# Patient Record
Sex: Female | Born: 1975 | Race: Black or African American | Hispanic: No | Smoking: Never smoker
Health system: Southern US, Community
[De-identification: ages and names within clinical notes are randomized; demographics above are authoritative.]

## PROBLEM LIST (undated history)

## (undated) ENCOUNTER — Ambulatory Visit: Discharge status: Absent without leave | Payer: Medicaid Other

## (undated) ENCOUNTER — Emergency Department (HOSPITAL_BASED_OUTPATIENT_CLINIC_OR_DEPARTMENT_OTHER): Admission: EM | Payer: Medicaid Other | Source: Home / Self Care

## (undated) ENCOUNTER — Emergency Department (HOSPITAL_COMMUNITY): Discharge status: Absent without leave | Payer: Medicaid Other

## (undated) DIAGNOSIS — F32A Depression, unspecified: Secondary | ICD-10-CM

## (undated) DIAGNOSIS — D494 Neoplasm of unspecified behavior of bladder: Secondary | ICD-10-CM

## (undated) DIAGNOSIS — M419 Scoliosis, unspecified: Secondary | ICD-10-CM

## (undated) DIAGNOSIS — M479 Spondylosis, unspecified: Secondary | ICD-10-CM

## (undated) DIAGNOSIS — M5136 Other intervertebral disc degeneration, lumbar region: Secondary | ICD-10-CM

## (undated) DIAGNOSIS — N939 Abnormal uterine and vaginal bleeding, unspecified: Secondary | ICD-10-CM

## (undated) DIAGNOSIS — G43909 Migraine, unspecified, not intractable, without status migrainosus: Secondary | ICD-10-CM

## (undated) DIAGNOSIS — G2581 Restless legs syndrome: Secondary | ICD-10-CM

## (undated) DIAGNOSIS — G4730 Sleep apnea, unspecified: Secondary | ICD-10-CM

## (undated) DIAGNOSIS — R351 Nocturia: Secondary | ICD-10-CM

## (undated) DIAGNOSIS — Z8719 Personal history of other diseases of the digestive system: Secondary | ICD-10-CM

## (undated) DIAGNOSIS — F419 Anxiety disorder, unspecified: Secondary | ICD-10-CM

## (undated) DIAGNOSIS — G588 Other specified mononeuropathies: Secondary | ICD-10-CM

## (undated) DIAGNOSIS — F329 Major depressive disorder, single episode, unspecified: Secondary | ICD-10-CM

## (undated) DIAGNOSIS — R3915 Urgency of urination: Secondary | ICD-10-CM

## (undated) DIAGNOSIS — R102 Pelvic and perineal pain: Secondary | ICD-10-CM

## (undated) DIAGNOSIS — K219 Gastro-esophageal reflux disease without esophagitis: Secondary | ICD-10-CM

## (undated) DIAGNOSIS — I10 Essential (primary) hypertension: Secondary | ICD-10-CM

## (undated) DIAGNOSIS — R002 Palpitations: Secondary | ICD-10-CM

## (undated) DIAGNOSIS — M797 Fibromyalgia: Secondary | ICD-10-CM

## (undated) DIAGNOSIS — M51369 Other intervertebral disc degeneration, lumbar region without mention of lumbar back pain or lower extremity pain: Secondary | ICD-10-CM

## (undated) HISTORY — PX: ABDOMINAL HYSTERECTOMY: SHX81

## (undated) HISTORY — PX: LIPOMA EXCISION: SHX5283

---

## 2000-04-04 ENCOUNTER — Emergency Department (HOSPITAL_COMMUNITY): Admission: EM | Admit: 2000-04-04 | Discharge: 2000-04-04 | Payer: Self-pay | Admitting: Emergency Medicine

## 2005-04-06 ENCOUNTER — Other Ambulatory Visit: Admission: RE | Admit: 2005-04-06 | Discharge: 2005-04-06 | Payer: Self-pay | Admitting: Obstetrics and Gynecology

## 2005-05-15 ENCOUNTER — Inpatient Hospital Stay (HOSPITAL_COMMUNITY): Admission: AD | Admit: 2005-05-15 | Discharge: 2005-05-15 | Payer: Self-pay | Admitting: Obstetrics and Gynecology

## 2005-07-03 ENCOUNTER — Inpatient Hospital Stay (HOSPITAL_COMMUNITY): Admission: AD | Admit: 2005-07-03 | Discharge: 2005-07-03 | Payer: Self-pay | Admitting: Obstetrics and Gynecology

## 2005-07-04 ENCOUNTER — Inpatient Hospital Stay (HOSPITAL_COMMUNITY): Admission: AD | Admit: 2005-07-04 | Discharge: 2005-07-04 | Payer: Self-pay | Admitting: Obstetrics and Gynecology

## 2005-07-31 ENCOUNTER — Inpatient Hospital Stay (HOSPITAL_COMMUNITY): Admission: AD | Admit: 2005-07-31 | Discharge: 2005-08-05 | Payer: Self-pay | Admitting: Obstetrics and Gynecology

## 2005-08-01 ENCOUNTER — Ambulatory Visit: Payer: Self-pay | Admitting: *Deleted

## 2005-08-15 ENCOUNTER — Inpatient Hospital Stay (HOSPITAL_COMMUNITY): Admission: AD | Admit: 2005-08-15 | Discharge: 2005-08-15 | Payer: Self-pay | Admitting: Obstetrics and Gynecology

## 2005-08-28 ENCOUNTER — Inpatient Hospital Stay (HOSPITAL_COMMUNITY): Admission: AD | Admit: 2005-08-28 | Discharge: 2005-08-30 | Payer: Self-pay | Admitting: Obstetrics and Gynecology

## 2005-09-29 ENCOUNTER — Emergency Department (HOSPITAL_COMMUNITY): Admission: EM | Admit: 2005-09-29 | Discharge: 2005-09-29 | Payer: Self-pay | Admitting: Emergency Medicine

## 2005-12-13 ENCOUNTER — Emergency Department (HOSPITAL_COMMUNITY): Admission: EM | Admit: 2005-12-13 | Discharge: 2005-12-13 | Payer: Self-pay | Admitting: Family Medicine

## 2006-02-05 ENCOUNTER — Emergency Department (HOSPITAL_COMMUNITY): Admission: EM | Admit: 2006-02-05 | Discharge: 2006-02-05 | Payer: Self-pay | Admitting: Emergency Medicine

## 2006-04-18 ENCOUNTER — Other Ambulatory Visit: Admission: RE | Admit: 2006-04-18 | Discharge: 2006-04-18 | Payer: Self-pay | Admitting: Obstetrics and Gynecology

## 2007-12-29 ENCOUNTER — Inpatient Hospital Stay (HOSPITAL_COMMUNITY): Admission: AD | Admit: 2007-12-29 | Discharge: 2007-12-29 | Payer: Self-pay | Admitting: Obstetrics and Gynecology

## 2008-01-10 ENCOUNTER — Emergency Department (HOSPITAL_COMMUNITY): Admission: EM | Admit: 2008-01-10 | Discharge: 2008-01-10 | Payer: Self-pay | Admitting: Emergency Medicine

## 2008-01-13 ENCOUNTER — Emergency Department (HOSPITAL_COMMUNITY): Admission: EM | Admit: 2008-01-13 | Discharge: 2008-01-13 | Payer: Self-pay | Admitting: Family Medicine

## 2008-02-01 ENCOUNTER — Emergency Department (HOSPITAL_COMMUNITY): Admission: EM | Admit: 2008-02-01 | Discharge: 2008-02-01 | Payer: Self-pay | Admitting: Neurosurgery

## 2008-03-28 ENCOUNTER — Ambulatory Visit (HOSPITAL_COMMUNITY): Admission: RE | Admit: 2008-03-28 | Discharge: 2008-03-28 | Payer: Self-pay | Admitting: Obstetrics and Gynecology

## 2008-04-08 ENCOUNTER — Encounter: Admission: RE | Admit: 2008-04-08 | Discharge: 2008-04-08 | Payer: Self-pay | Admitting: Obstetrics and Gynecology

## 2008-04-14 ENCOUNTER — Inpatient Hospital Stay (HOSPITAL_COMMUNITY): Admission: AD | Admit: 2008-04-14 | Discharge: 2008-04-15 | Payer: Self-pay | Admitting: Obstetrics and Gynecology

## 2008-04-25 ENCOUNTER — Ambulatory Visit (HOSPITAL_COMMUNITY): Admission: RE | Admit: 2008-04-25 | Discharge: 2008-04-25 | Payer: Self-pay | Admitting: Obstetrics and Gynecology

## 2008-05-16 ENCOUNTER — Ambulatory Visit (HOSPITAL_COMMUNITY): Admission: RE | Admit: 2008-05-16 | Discharge: 2008-05-16 | Payer: Self-pay | Admitting: Obstetrics and Gynecology

## 2008-05-27 ENCOUNTER — Inpatient Hospital Stay (HOSPITAL_COMMUNITY): Admission: AD | Admit: 2008-05-27 | Discharge: 2008-05-27 | Payer: Self-pay | Admitting: Obstetrics and Gynecology

## 2008-05-29 ENCOUNTER — Inpatient Hospital Stay (HOSPITAL_COMMUNITY): Admission: AD | Admit: 2008-05-29 | Discharge: 2008-05-29 | Payer: Self-pay | Admitting: Obstetrics and Gynecology

## 2008-06-06 ENCOUNTER — Ambulatory Visit (HOSPITAL_COMMUNITY): Admission: RE | Admit: 2008-06-06 | Discharge: 2008-06-06 | Payer: Self-pay | Admitting: Obstetrics and Gynecology

## 2008-06-08 ENCOUNTER — Inpatient Hospital Stay (HOSPITAL_COMMUNITY): Admission: AD | Admit: 2008-06-08 | Discharge: 2008-06-09 | Payer: Self-pay | Admitting: Obstetrics and Gynecology

## 2008-06-15 ENCOUNTER — Inpatient Hospital Stay (HOSPITAL_COMMUNITY): Admission: AD | Admit: 2008-06-15 | Discharge: 2008-06-15 | Payer: Self-pay | Admitting: Obstetrics and Gynecology

## 2008-06-20 ENCOUNTER — Inpatient Hospital Stay (HOSPITAL_COMMUNITY): Admission: AD | Admit: 2008-06-20 | Discharge: 2008-06-20 | Payer: Self-pay | Admitting: Obstetrics and Gynecology

## 2008-07-03 ENCOUNTER — Inpatient Hospital Stay (HOSPITAL_COMMUNITY): Admission: AD | Admit: 2008-07-03 | Discharge: 2008-07-05 | Payer: Self-pay | Admitting: Obstetrics and Gynecology

## 2008-08-31 ENCOUNTER — Emergency Department (HOSPITAL_BASED_OUTPATIENT_CLINIC_OR_DEPARTMENT_OTHER): Admission: EM | Admit: 2008-08-31 | Discharge: 2008-08-31 | Payer: Self-pay | Admitting: Emergency Medicine

## 2008-10-18 HISTORY — PX: ESSURE TUBAL LIGATION: SUR464

## 2009-04-28 ENCOUNTER — Ambulatory Visit: Payer: Self-pay | Admitting: Diagnostic Radiology

## 2009-04-28 ENCOUNTER — Emergency Department (HOSPITAL_BASED_OUTPATIENT_CLINIC_OR_DEPARTMENT_OTHER): Admission: EM | Admit: 2009-04-28 | Discharge: 2009-04-28 | Payer: Self-pay | Admitting: Emergency Medicine

## 2009-05-02 ENCOUNTER — Emergency Department (HOSPITAL_BASED_OUTPATIENT_CLINIC_OR_DEPARTMENT_OTHER): Admission: EM | Admit: 2009-05-02 | Discharge: 2009-05-02 | Payer: Self-pay | Admitting: Emergency Medicine

## 2009-05-02 ENCOUNTER — Ambulatory Visit: Payer: Self-pay | Admitting: Diagnostic Radiology

## 2009-07-05 ENCOUNTER — Emergency Department (HOSPITAL_COMMUNITY): Admission: EM | Admit: 2009-07-05 | Discharge: 2009-07-05 | Payer: Self-pay | Admitting: Family Medicine

## 2009-07-08 ENCOUNTER — Ambulatory Visit (HOSPITAL_COMMUNITY): Admission: RE | Admit: 2009-07-08 | Discharge: 2009-07-08 | Payer: Self-pay | Admitting: Obstetrics and Gynecology

## 2010-05-17 ENCOUNTER — Emergency Department (HOSPITAL_BASED_OUTPATIENT_CLINIC_OR_DEPARTMENT_OTHER): Admission: EM | Admit: 2010-05-17 | Discharge: 2010-05-17 | Payer: Self-pay | Admitting: Emergency Medicine

## 2010-10-25 ENCOUNTER — Emergency Department (HOSPITAL_BASED_OUTPATIENT_CLINIC_OR_DEPARTMENT_OTHER)
Admission: EM | Admit: 2010-10-25 | Discharge: 2010-10-25 | Payer: Self-pay | Source: Home / Self Care | Admitting: Emergency Medicine

## 2010-10-27 ENCOUNTER — Ambulatory Visit
Admission: RE | Admit: 2010-10-27 | Discharge: 2010-10-27 | Payer: Self-pay | Source: Home / Self Care | Attending: Family Medicine | Admitting: Family Medicine

## 2010-10-27 DIAGNOSIS — S93409A Sprain of unspecified ligament of unspecified ankle, initial encounter: Secondary | ICD-10-CM | POA: Insufficient documentation

## 2010-10-27 DIAGNOSIS — E0590 Thyrotoxicosis, unspecified without thyrotoxic crisis or storm: Secondary | ICD-10-CM | POA: Insufficient documentation

## 2010-10-27 DIAGNOSIS — K219 Gastro-esophageal reflux disease without esophagitis: Secondary | ICD-10-CM | POA: Insufficient documentation

## 2010-10-27 DIAGNOSIS — F411 Generalized anxiety disorder: Secondary | ICD-10-CM | POA: Insufficient documentation

## 2010-11-04 ENCOUNTER — Ambulatory Visit
Admission: RE | Admit: 2010-11-04 | Discharge: 2010-11-04 | Payer: Self-pay | Source: Home / Self Care | Attending: Family Medicine | Admitting: Family Medicine

## 2010-11-08 ENCOUNTER — Encounter: Payer: Self-pay | Admitting: Obstetrics and Gynecology

## 2010-11-16 ENCOUNTER — Encounter: Admission: RE | Admit: 2010-11-16 | Payer: Self-pay | Source: Home / Self Care | Admitting: Family Medicine

## 2010-11-17 ENCOUNTER — Ambulatory Visit
Admission: RE | Admit: 2010-11-17 | Discharge: 2010-11-17 | Payer: Self-pay | Source: Home / Self Care | Attending: Family Medicine | Admitting: Family Medicine

## 2010-11-18 ENCOUNTER — Ambulatory Visit: Payer: Medicaid Other | Attending: Family Medicine | Admitting: Physical Therapy

## 2010-11-18 DIAGNOSIS — M25579 Pain in unspecified ankle and joints of unspecified foot: Secondary | ICD-10-CM | POA: Insufficient documentation

## 2010-11-18 DIAGNOSIS — R262 Difficulty in walking, not elsewhere classified: Secondary | ICD-10-CM | POA: Insufficient documentation

## 2010-11-18 DIAGNOSIS — M25673 Stiffness of unspecified ankle, not elsewhere classified: Secondary | ICD-10-CM | POA: Insufficient documentation

## 2010-11-18 DIAGNOSIS — IMO0001 Reserved for inherently not codable concepts without codable children: Secondary | ICD-10-CM | POA: Insufficient documentation

## 2010-11-18 DIAGNOSIS — M25676 Stiffness of unspecified foot, not elsewhere classified: Secondary | ICD-10-CM | POA: Insufficient documentation

## 2010-11-19 NOTE — Assessment & Plan Note (Signed)
Summary: SPRAIN LEFT ANKLE/NP/LP   Vital Signs:  Patient profile:   35 year old female Height:      63 inches (160.02 cm) Weight:      185.0 pounds (84.09 kg) BMI:     32.89 Temp:     97.9 degrees F (36.61 degrees C) oral Pulse rate:   56 / minute BP sitting:   107 / 71  (left arm)  Vitals Entered By: Baxter Hire) (October 27, 2010 10:07 AM) CC: sprain left ankle Pain Assessment Patient in pain? yes     Location: left ankle Intensity: 7 Type: aching Onset of pain  constant pain x 2 days Nutritional Status BMI of > 30 = obese  Does patient need assistance? Functional Status Self care Ambulation Normal   CC:  sprain left ankle.  History of Present Illness: 34 yo F here for left ankle injury  Patient reports she was running quickly because her child was about to fall down the stairs. She tripped over a child's car toy and inverted her left ankle and fell down. This occurred on 10/25/10. No prior ankle injuries. + Swelling but no bruising. Went to ED and had x-rays that showed possible indeterminate avulsion injury to navicular but she has not had pain in this area Was placed in walking boot, given pain medication and advised to follow up here Pain continues and has been icing. Not taking NSAIDs.  Difficulty bearing weight comfortably.  Habits & Providers  Alcohol-Tobacco-Diet     Alcohol drinks/day: 0     Tobacco Status: never  Allergies (verified): No Known Drug Allergies  Family History: negative for DM, HTN, heart disease  Social History: no tobacco, alcohol abuse StudentSmoking Status:  never  Physical Exam  General:  Well-developed,well-nourished,in no acute distress; alert,appropriate and cooperative throughout examination Msk:  L Ankle: Moderate swelling throughout lateral and medial ankle. No bruising or erythema. Very limited ROM. Strength not tested. Negative squeeze test. No TTP navicular, base 5th.  TTP throughout ankle joint  including post med and lat malleoli.  No TTP fibular head. Able to walk 4 steps only with boot on.   Impression & Recommendations:  Problem # 1:  ANKLE SPRAIN, LEFT (ICD-845.00) Assessment New  Patient's x-ray finding over navicular inconsistent with her current injury - no pain at this area.  Patient with grade 2-3 ankle sprain.  Continue using boot but also use crutches to help wtih ambulation for next 7-10 days - wean off crutches as tolerated.  Icing, elevation, ACE wrap, early motion exercises demonstrated.  Aleve for pain and swelling.  F/u in 7-10 days for recheck - reexam and hopefully transition to ASO.  See instructions for further.  Consider PT at f/u as well.  Orders: Crutches-[facility] ([id number])  Complete Medication List: 1)  Alprazolam 1 Mg Tabs (Alprazolam) .... Prn 2)  Lexapro 10 Mg Tabs (Escitalopram oxalate)  Patient Instructions: 1)  Wear boot except when showering, sleeping. 2)  Ice for 15 minutes at a time 3-4 times a day 3)  Take aleve 2 tabs twice a day with food for pain, swelling, and inflammation. 4)  You can take the pain medication in addition to this. 5)  Elevate above the level of your heart when possible 6)  Crutches if needed to help with walking 7)  Bear weight when tolerated 8)  Up/down and alphabet exercise 2-3 sets daily 9)  Follow up with me in 7-10 days for a recheck. 10)  Hopefully at this visit we  can get you out of the boot and into the lace up ankle brace. 11)  Call me with any questions.   Orders Added: 1)  New Patient Level III [99203] 2)  Crutches-[facility] [E0114]

## 2010-11-19 NOTE — Assessment & Plan Note (Signed)
Summary: F/U/LP   Vital Signs:  Patient profile:   35 year old female Height:      63 inches (160.02 cm) Weight:      185 pounds (84.09 kg) BMI:     32.89 Temp:     98.1 degrees F (36.72 degrees C) oral Pulse rate:   82 / minute BP sitting:   110 / 71  (left arm)  Vitals Entered By: Baxter Hire) (November 04, 2010 10:03 AM) CC: follow-up visit Pain Assessment Patient in pain? yes     Location: left foot Intensity: 2 Nutritional Status BMI of > 30 = obese  Does patient need assistance? Functional Status Self care Ambulation Normal   CC:  follow-up visit.  History of Present Illness: 35 yo F here for  ~ 1 1/2 week f/u left ankle sprain  Initial injury from when she was running quickly because her child was about to fall down the stairs. She tripped over a child's car toy and inverted her left ankle and fell down. This occurred on 10/25/10. No prior ankle injuries. + Swelling but no bruising - much better now. Went to ED and had x-rays that showed possible indeterminate avulsion injury to navicular but she has not had pain in this area Was placed in walking boot, given pain medication and advised to follow up here Advised to wear walking boot and use crutches after visit here Now able to walk without crutches Pain much improved - now a 2/10 from a 7/10 Has been icing, elevating, using ACE wrap, and doing motion exercises.  Habits & Providers  Alcohol-Tobacco-Diet     Alcohol drinks/day: 0     Tobacco Status: never  Problems Prior to Update: 1)  Ankle Sprain, Left  (ICD-845.00) 2)  Hyperthyroidism  (ICD-242.90) 3)  Gerd  (ICD-530.81) 4)  Generalized Anxiety Disorder  (ICD-300.02)  Medications Prior to Update: 1)  Alprazolam 1 Mg Tabs (Alprazolam) .... Prn 2)  Lexapro 10 Mg Tabs (Escitalopram Oxalate)  Allergies (verified): No Known Drug Allergies  Physical Exam  General:  Well-developed,well-nourished,in no acute distress; alert,appropriate and  cooperative throughout examination Msk:  L Ankle: Mild swelling throughout lateral and medial ankle. No bruising or erythema. ROM almost full with plantar/dorsiflexion. Strength not tested. Negative squeeze test. No TTP navicular, base 5th.  TTP throughout ankle joint but not at post med and lat malleoli.  No TTP fibular head. Able to walk 4 steps without boot with mild limp. 1+ ant drawer and talar tilt - pain with talar tilt laterally.   Impression & Recommendations:  Problem # 1:  ANKLE SPRAIN, LEFT (ICD-845.00) Assessment Improved  Transition out of walking boot and into ASO.  Discontinue crutches.  Continue with elevation, icing, ace wrap as needed, aleve as needed.  Start formal PT over next 2 weeks then f/u in 2 weeks for recheck.  Continue HEP.  Orders: Ankle Training Brace/ASO Support [phone])  Complete Medication List: 1)  Alprazolam 1 Mg Tabs (Alprazolam) .... Prn 2)  Lexapro 10 Mg Tabs (Escitalopram oxalate)  Patient Instructions: 1)  Stop using boot and use the ankle brace instead in a well supported shoe (tennis shoe). 2)  Continue icing for 15 minutes at a time 3-4 times a day 3)  Take aleve 2 tabs twice a day with food for pain, swelling, and inflammation as needed. 4)  Elevate above the level of your heart when possible 5)  Up/down and alphabet exercise 2-3 sets daily 6)  Go to physical therapy  over the next 2 weeks. 7)  Follow up with me in 2 weeks for a recheck. 8)  Call me with any questions.   Orders Added: 1)  Est. Patient Level III [72536] 2)  Ankle Training Brace/ASO Support [[id number]]

## 2010-11-25 ENCOUNTER — Encounter: Payer: Medicaid Other | Admitting: Physical Therapy

## 2010-11-25 NOTE — Assessment & Plan Note (Signed)
Summary: F/U/LP   Vital Signs:  Patient profile:   35 year old female Height:      63 inches (160.02 cm) Weight:      185 pounds (84.09 kg) BMI:     32.89 Temp:     99.2 degrees F (37.33 degrees C) oral Pulse rate:   76 / minute BP sitting:   116 / 79  (left arm)  Vitals Entered By: Baxter Hire) (November 17, 2010 11:05 AM) CC: follow-up visit Pain Assessment Patient in pain? yes     Location: Lt. ankle Intensity: 2 Nutritional Status BMI of > 30 = obese  Does patient need assistance? Functional Status Self care Ambulation Normal   CC:  follow-up visit.  History of Present Illness: 35 yo F here for  ~ 2 week f/u left ankle sprain  Initial injury from when she was running quickly because 35 child was about to fall down the stairs. She tripped over a child's car toy and inverted her left ankle and fell down. This occurred on 10/25/10. No prior ankle injuries. No longer with swelling or bruising. After injury went to ED and had x-rays that showed possible indeterminate avulsion injury to navicular but she has not had pain in this area Was placed in walking boot, given pain medication and advised to follow up here Advised to wear walking boot and use crutches after visit here At last visit off crutches and transitioned then to ASO Has not yet started PT Walking without a limp now Pain at nighttime but otherwise minimal pain Doing home exercises. Taking aleve and icing as needed.  Habits & Providers  Alcohol-Tobacco-Diet     Alcohol drinks/day: 0     Tobacco Status: never  Problems Prior to Update: 1)  Ankle Sprain, Left  (ICD-845.00) 2)  Hyperthyroidism  (ICD-242.90) 3)  Gerd  (ICD-530.81) 4)  Generalized Anxiety Disorder  (ICD-300.02)  Medications Prior to Update: 1)  Alprazolam 1 Mg Tabs (Alprazolam) .... Prn 2)  Lexapro 10 Mg Tabs (Escitalopram Oxalate)  Allergies (verified): No Known Drug Allergies  Physical Exam  General:   Well-developed,well-nourished,in no acute distress; alert,appropriate and cooperative throughout examination Msk:  L Ankle: No swelling, bruising or erythema. ROM full with plantar/dorsiflexion.  Mild limitation with IR/ER. Negative squeeze test. No TTP navicular, base 5th.  Mild TTP throughout ankle joint but not at post med and lat malleoli.  No TTP fibular head. Able to walk without limp in hallway. 1+ ant drawer and talar tilt - no pain with testing NVI distally.   Impression & Recommendations:  Problem # 1:  ANKLE SPRAIN, LEFT (ICD-845.00) Assessment Improved Much better.  Continue with ASO at least an additional 2 weeks.  Starting PT tomorrow then to transition to HEP.  Ice and aleve as needed.  Elevate if swelling.  See instructions for further.  F/u as needed.  Complete Medication List: 1)  Alprazolam 1 Mg Tabs (Alprazolam) .... Prn 2)  Lexapro 10 Mg Tabs (Escitalopram oxalate)  Patient Instructions: 1)  Continue using the laceup brace for at least the next 2 weeks.  Can stop using when not hurting anymore 2)  Go through physical therapy for 2 weeks then do home exercises for at least 4 more weeks. 3)  Ice and aleve as needed. 4)  Follow up with me as needed.   Orders Added: 1)  Est. Patient Level II [16109]

## 2011-01-02 LAB — WOUND CULTURE

## 2011-01-24 LAB — URINE MICROSCOPIC-ADD ON

## 2011-01-24 LAB — COMPREHENSIVE METABOLIC PANEL
ALT: 6 U/L (ref 0–35)
ALT: <6 U/L (ref 0–35)
AST: 21 U/L (ref 0–37)
AST: 22 U/L (ref 0–37)
Albumin: 3.7 g/dL (ref 3.5–5.2)
Albumin: 3.8 g/dL (ref 3.5–5.2)
Alkaline Phosphatase: 77 U/L (ref 39–117)
Alkaline Phosphatase: 93 U/L (ref 39–117)
BUN: 7 mg/dL (ref 6–23)
CO2: 28 mEq/L (ref 19–32)
CO2: 28 mEq/L (ref 19–32)
Calcium: 8.6 mg/dL (ref 8.4–10.5)
Chloride: 107 mEq/L (ref 96–112)
Chloride: 107 mEq/L (ref 96–112)
Creatinine, Ser: 0.8 mg/dL (ref 0.4–1.2)
GFR calc Af Amer: >60 mL/min (ref >60)
GFR calc Af Amer: >60 mL/min (ref >60)
GFR calc non Af Amer: >60 mL/min (ref >60)
GFR calc non Af Amer: >60 mL/min (ref >60)
Glucose, Bld: 84 mg/dL (ref 70–99)
Potassium: 3.3 mEq/L — ABNORMAL LOW (ref 3.5–5.1)
Potassium: 3.6 mEq/L (ref 3.5–5.1)
Sodium: 141 mEq/L (ref 135–145)
Total Bilirubin: 0.2 mg/dL — ABNORMAL LOW (ref 0.3–1.2)
Total Bilirubin: 0.4 mg/dL (ref 0.3–1.2)
Total Protein: 7.2 g/dL (ref 6.0–8.3)

## 2011-01-24 LAB — DIFFERENTIAL
Basophils Absolute: 0 10*3/uL (ref 0.0–0.1)
Basophils Absolute: 0 10*3/uL (ref 0.0–0.1)
Basophils Relative: 0 % (ref 0–1)
Basophils Relative: 0 % (ref 0–1)
Eosinophils Absolute: 0.4 10*3/uL (ref 0.0–0.7)
Eosinophils Absolute: 0.6 10*3/uL (ref 0.0–0.7)
Eosinophils Relative: 5 % (ref 0–5)
Eosinophils Relative: 8 % — ABNORMAL HIGH (ref 0–5)
Lymphocytes Relative: 19 % (ref 12–46)
Lymphs Abs: 1.4 10*3/uL (ref 0.7–4.0)
Monocytes Absolute: 0.4 10*3/uL (ref 0.1–1.0)
Monocytes Absolute: 0.6 10*3/uL (ref 0.1–1.0)
Monocytes Relative: 8 % (ref 3–12)
Neutro Abs: 5.2 10*3/uL (ref 1.7–7.7)
Neutrophils Relative %: 67 % (ref 43–77)

## 2011-01-24 LAB — URINALYSIS, ROUTINE W REFLEX MICROSCOPIC
Bilirubin Urine: NEGATIVE
Bilirubin Urine: NEGATIVE
Glucose, UA: NEGATIVE mg/dL
Glucose, UA: NEGATIVE mg/dL
Hgb urine dipstick: NEGATIVE
Hgb urine dipstick: NEGATIVE
Ketones, ur: 15 mg/dL — AB
Ketones, ur: NEGATIVE mg/dL
Nitrite: NEGATIVE
Nitrite: NEGATIVE
Protein, ur: NEGATIVE mg/dL
Protein, ur: NEGATIVE mg/dL
Specific Gravity, Urine: 1.021 (ref 1.005–1.030)
Specific Gravity, Urine: 1.021 (ref 1.005–1.030)
Urobilinogen, UA: 0.2 mg/dL (ref 0.0–1.0)
Urobilinogen, UA: 0.2 mg/dL (ref 0.0–1.0)
pH: 6 (ref 5.0–8.0)
pH: 6 (ref 5.0–8.0)

## 2011-01-24 LAB — GC/CHLAMYDIA PROBE AMP, GENITAL
Chlamydia, DNA Probe: NEGATIVE
GC Probe Amp, Genital: NEGATIVE

## 2011-01-24 LAB — CBC
HCT: 37.1 % (ref 36.0–46.0)
Hemoglobin: 12.6 g/dL (ref 12.0–15.0)
MCHC: 33.9 g/dL (ref 30.0–36.0)
MCV: 86.2 fL (ref 78.0–100.0)
Platelets: 215 10*3/uL (ref 150–400)
Platelets: 231 10*3/uL (ref 150–400)
RBC: 4.26 MIL/uL (ref 3.87–5.11)
RBC: 4.31 MIL/uL (ref 3.87–5.11)
RDW: 12.8 % (ref 11.5–15.5)
WBC: 7.6 10*3/uL (ref 4.0–10.5)
WBC: 7.7 10*3/uL (ref 4.0–10.5)

## 2011-01-24 LAB — PREGNANCY, URINE
Preg Test, Ur: NEGATIVE
Preg Test, Ur: NEGATIVE

## 2011-01-24 LAB — WET PREP, GENITAL
Trich, Wet Prep: NONE SEEN
WBC, Wet Prep HPF POC: NONE SEEN
Yeast Wet Prep HPF POC: NONE SEEN

## 2011-03-02 NOTE — Discharge Summary (Signed)
Kayla Mendoza, Kayla Mendoza NO.:  [id number]   MEDICAL RECORD NO.:  [id number]          PATIENT TYPE:  INP   LOCATION:  9120                          FACILITY:  [facility]   PHYSICIAN:  Huel Cote, M.D. DATE OF BIRTH:  [birth year]   DATE OF ADMISSION:  07/03/2008  DATE OF DISCHARGE:  07/05/2008                               DISCHARGE SUMMARY   DISCHARGE DIAGNOSES:  1. Term pregnancy at 39 plus weeks, delivered.  2. Status post normal spontaneous vaginal delivery.   DISCHARGE MEDICATIONS:  1. Motrin 600 mg p.o. every 6 hours.  2. Percocet 1-2 tablets p.o. every 4 hours p.r.n.   DISCHARGE FOLLOWUP:  The patient is to follow up in 6 weeks for her  routine postpartum exam.   HOSPITAL COURSE:  The patient is a 35 year old G7, P 1-2-3-3, who was  admitted at 54 plus weeks' gestation with induction of labor given her  term status and a favorable cervix.  Baby was also thought to be on the  large side, greater than 8 pounds.  Prenatal care was notable for a  history of preterm delivery.  She received Delalutin throughout her  pregnancy weekly up until 36 weeks.  She also has hyperthyroidism, on  PTU monitored by Dr. Talmage Nap.  She had serial growth scans and NSTs 2  times a week after 32 weeks.   Prenatal labs are as follows, O positive, antibody negative, sickle  negative, RPR nonreactive, rubella immune, hepatitis B surface antigen  negative, HIV nonreactive.  She had an initial gonorrhea culture, which  was positive, which was treated with a negative test of cure, chlamydia  negative, group B strep positive, 1-hour Glucola normal, and first  trimester screen normal.   PAST OBSTETRICAL HISTORY:  In 1993, she had a vaginal delivery of a term  baby.  In 1999, she had a vaginal delivery of a 36-week infant.  In 1999  and 2004, she had 2 elective abortions.  In 2006, she had a vaginal  delivery of a 36-week infant, and in 2008, she had a spontaneous  miscarriage.   PAST GYN  HISTORY:  History of abnormal Pap smears in 2000, resolved now.   PAST SURGICAL HISTORY:  None.   PAST MEDICAL HISTORY:  Hyperthyroidism, history of ulcer disease,  anxiety, depression, and MRSA boils.   MEDICATIONS:  PTU.   On admission, she was 50, 3-4, and -1-2  station.  She had already  received her penicillin for her group B strep prophylaxis and was placed  on Pitocin.  She progressed quickly throughout the day, reached complete  dilation, and pushed great with a normal spontaneous vaginal delivery of  a vigorous female infant over an intact perineum.  Apgars  were 8 and 9.  Weight was 8 pounds 11 ounces.  Placenta delivered  spontaneously.  She was then admitted for routine postpartum care.  On  postpartum day #2, she was doing quite well.  Her discharge hemoglobin  was 9.2.  She was tolerating her p.o. pain medications and bleeding was  normal and she was felt stable for discharge  home.      Huel Cote, M.D.  Electronically Signed     KR/MEDQ  D:  08/25/2008  T:  08/26/2008  Job:  [id number]

## 2011-03-05 NOTE — Discharge Summary (Signed)
Kayla Mendoza, Kayla Mendoza                 ACCOUNT NO.:  [id number]   MEDICAL RECORD NO.:  [id number]          PATIENT TYPE:  INP   LOCATION:  9158                          FACILITY:  [facility]   PHYSICIAN:  Malachi Pro. Ambrose Mantle, M.D. DATE OF BIRTH:  1975-12-10   DATE OF ADMISSION:  07/31/2005  DATE OF DISCHARGE:  08/05/2005                                 DISCHARGE SUMMARY   LABORATORY DATA AND X-RAY FINDINGS:  O positive with a negative antibody.  GC and Chlamydia negative.  Group B Streptococcus positive.  Rubella immune.  Hepatitis B surface antigen negative.  HIV negative.  Triple screen  negative.  RPR nonreactive.   HISTORY OF PRESENT ILLNESS:  A 35 year old, black female, P1-1-2-2 at 35-0/7  weeks' gestation with [facility] of September 25, 2005, by 13-week ultrasound,  inconsistent with last period presented to [facility] with contractions every 5  minutes x3 hours.  She had intermittent preterm labor with the cervix 1 cm  on prior exam.  She was given subcu terbutaline previously.  Prenatal care  complicated by prior preterm labor x2 delivered at 46 and 38 weeks.  Group B  Streptococcus positive on urine culture.  Fetal fibronectin negative on  July 06, 2005.   PAST OBSTETRICAL HISTORY:  In 1993, she delivered a 6 pound 13 ounce infant  at 38 weeks with preterm labor.  In 1999, a 6 pound 6 ounce infant at 36  weeks in preterm labor.  Early abortion x2.   PAST GYNECOLOGICAL HISTORY:  History of HPV, Pap smear resolved.   PAST SURGICAL HISTORY:  Negative.   PAST MEDICAL HISTORY:  1.  History of ulcer.  2.  GERD.  3.  History of anxiety and depression.   ALLERGIES:  No known drug allergies.   PHYSICAL EXAMINATION:  VITAL SIGNS:  On admission, the patient was afebrile.  HEART:  Normal.  LUNGS:  Normal.  ABDOMEN:  Gravid.  Nontender.  PELVIC:  Cervix soft, ballotable vertex at 2+ cm by Dr. Berenda Morale exam.   HOSPITAL COURSE:  The patient was placed on magnesium sulfate tocolysis,  ampicillin  for Group B Streptococcus coverage and placed at bed rest and  clear diet.  The patient was given betamethasone.  Magnesium sulfate was  stopped on August 02, 2005.  The patient did have an elevated temperature  at 100.2 degrees at 4 p.m. on August 02, 2005.  T-max was 99.9 on August 03, 2005.  She continues to have contractions, but by Dr. Berenda Morale exam  on August 04, 2005, the cervix had not changed.  On August 05, 2005, the  patient was considered candidate for discharge.  Her Group B Streptococcus  culture was positive.  Hemoglobin was 12.5, hematocrit 37.7, white count  9200.  Urinalysis was negative.  RPR was nonreactive.  Magnesium levels were  within therapeutic range.  The patient's highest temperature in the past 24  hours has been 98.7.   DISCHARGE DIAGNOSIS:  Intrauterine pregnancy at 32+ weeks with possible  preterm labor treated with magnesium sulfate, ampicillin and steroids.   CONDITION ON DISCHARGE:  Improved.  SPECIAL INSTRUCTIONS:  Instructions include our regular discharge  instructions.  Call with any unusual problems.  If she has contractions, she  can take Procardia 20 mg every 4-6 hours as needed for the contractions.   DISCHARGE MEDICATIONS:  She is to take 5 more days of ampicillin 500 mg by  mouth every 6 hours to make a full 10-day course.   ACTIVITY:  The patient is advised to rest, but not necessarily strict bed  rest.   FOLLOW UP:  Return to the office in 1 week for followup examination.  She is  advised not to have intercourse.      Malachi Pro. Ambrose Mantle, M.D.  Electronically Signed     TFH/MEDQ  D:  08/05/2005  T:  08/05/2005  Job:  [id number]

## 2011-03-05 NOTE — Discharge Summary (Signed)
Kayla Mendoza, Kayla Mendoza                 ACCOUNT NO.:  [id number]   MEDICAL RECORD NO.:  [id number]          PATIENT TYPE:  INP   LOCATION:  9119                          FACILITY:  [facility]   PHYSICIAN:  Zenaida Niece, M.D.DATE OF BIRTH:  09/03/1976   DATE OF ADMISSION:  08/28/2005  DATE OF DISCHARGE:  08/30/2005                                 DISCHARGE SUMMARY   ADMITTING DIAGNOSES:  1.  Intrauterine pregnancy at 36 weeks.  2.  Preterm labor.  3.  Group B Strep carrier.   DISCHARGE DIAGNOSES:  1.  Intrauterine pregnancy at 36 weeks.  2.  Preterm labor.  3.  Group B Strep carrier.   PROCEDURES:  On November 11 she had a spontaneous vaginal delivery.   HISTORY AND PHYSICAL:  This is a 35 year old black female gravida 5, para 1-  1-2-2 with an EGA of [redacted] weeks who presents with a complaint of regular  contractions.  Evaluation in triage revealed her to be 3 cm dilated with  regular contractions.  Prenatal care complicated by preterm labor at 32  weeks for which she was hospitalized and gastroesophageal reflux treated  with Protonix.   PRENATAL LABORATORIES:  Blood type was O+ with a negative antibody screen.  RPR nonreactive.  Rubella immune.  Hepatitis B surface antigen negative.  HIV negative.  Gonorrhea and Chlamydia negative.  Triple screen normal.  One-  hour Glucola 77.  Group B Strep is positive in her urine.   PAST OBSTETRICAL HISTORY:  1993 vaginal delivery at 38 weeks 6 pounds 13  ounces complicated by preterm labor.  In 1999 vaginal delivery at 36 weeks 6  pounds 6 ounces complicated by preterm labor and two elective terminations.   PAST MEDICAL HISTORY:  Peptic ulcer disease and gastroesophageal reflux  disease as well as anxiety and depression.   PHYSICAL EXAMINATION:  VITAL SIGNS:  She was afebrile with stable vital  signs.  Fetal heart tracing was overall reassuring.  She did have an episode  of fetal bradycardia approximately 30 minutes after her epidural was  placed.  This responded to position change.  ABDOMEN:  Gravid, nontender with an estimated fetal weight of 6 pounds.  PELVIC:  Cervix on admission on my first examination was 3, 50, -2, vertex  presentation.   HOSPITAL COURSE:  The patient was admitted in what was felt to be early  labor.  She did receive an epidural.  She then had fetal decelerations.  At  that time I examined her and she was 3, 50, -2, and a fetal scalp electrode  was applied.  Fetal heart rate did eventually respond.  She was also put on  penicillin for group B Strep prophylaxis.  She progressed into active labor,  reached complete and pushed well, and on the afternoon of November 11 had a  vaginal delivery of a viable female infant with Apgars of 9 and 9 that weighed  5 pounds 13 ounces.  Placenta delivered spontaneously, was intact, and did  have a true knot in the cord.  The perineum was intact and estimated blood  loss was less than 500 mL.  Postpartum she had no significant complications.  Pre delivery hemoglobin 12.1, post delivery 10.8.  The baby did end up going  to the intensive care unit due to some respiratory difficulties.  On  postpartum day #2 the patient was felt to be stable enough for discharge  home.   DISCHARGE INSTRUCTIONS:  Regular diet.  Pelvic rest.  Follow up in six  weeks.   MEDICATIONS:  1.  Percocet #30 one to two p.o. q.4-6h. p.r.n. pain.  2.  Over-the-counter ibuprofen per rectum.   She was also given our discharge pamphlet.      Zenaida Niece, M.D.  Electronically Signed     TDM/MEDQ  D:  08/30/2005  T:  08/30/2005  Job:  [id number]

## 2011-05-05 ENCOUNTER — Encounter: Payer: Self-pay | Admitting: *Deleted

## 2011-05-05 ENCOUNTER — Emergency Department (HOSPITAL_BASED_OUTPATIENT_CLINIC_OR_DEPARTMENT_OTHER)
Admission: EM | Admit: 2011-05-05 | Discharge: 2011-05-06 | Disposition: A | Discharge status: Home / Self Care | Payer: Medicaid Other | Attending: Emergency Medicine | Admitting: Emergency Medicine

## 2011-05-05 DIAGNOSIS — N898 Other specified noninflammatory disorders of vagina: Secondary | ICD-10-CM | POA: Insufficient documentation

## 2011-05-05 DIAGNOSIS — K219 Gastro-esophageal reflux disease without esophagitis: Secondary | ICD-10-CM | POA: Insufficient documentation

## 2011-05-05 DIAGNOSIS — E079 Disorder of thyroid, unspecified: Secondary | ICD-10-CM | POA: Insufficient documentation

## 2011-05-05 DIAGNOSIS — R109 Unspecified abdominal pain: Secondary | ICD-10-CM | POA: Insufficient documentation

## 2011-05-05 DIAGNOSIS — F341 Dysthymic disorder: Secondary | ICD-10-CM | POA: Insufficient documentation

## 2011-05-05 DIAGNOSIS — A599 Trichomoniasis, unspecified: Secondary | ICD-10-CM | POA: Insufficient documentation

## 2011-05-05 HISTORY — DX: Anxiety disorder, unspecified: F41.9

## 2011-05-05 HISTORY — DX: Gastro-esophageal reflux disease without esophagitis: K21.9

## 2011-05-05 LAB — URINALYSIS, ROUTINE W REFLEX MICROSCOPIC
Bilirubin Urine: NEGATIVE
Hgb urine dipstick: NEGATIVE
Ketones, ur: NEGATIVE mg/dL
Protein, ur: NEGATIVE mg/dL
Specific Gravity, Urine: 1.016 (ref 1.005–1.030)
Urobilinogen, UA: 0.2 mg/dL (ref 0.0–1.0)

## 2011-05-05 LAB — URINE MICROSCOPIC-ADD ON

## 2011-05-05 NOTE — ED Notes (Signed)
Pt c/o right lower abd pain x 4 days  

## 2011-05-06 ENCOUNTER — Encounter (HOSPITAL_BASED_OUTPATIENT_CLINIC_OR_DEPARTMENT_OTHER): Payer: Self-pay | Admitting: Emergency Medicine

## 2011-05-06 MED ORDER — AZITHROMYCIN 250 MG PO TABS
1000.0000 mg | ORAL_TABLET | Freq: Once | ORAL | Status: AC
  Administered 2011-05-06: 1000 mg via ORAL
  Filled 2011-05-06: qty 4

## 2011-05-06 MED ORDER — LIDOCAINE HCL (PF) 1 % IJ SOLN
1.0000 mL | Freq: Once | INTRAMUSCULAR | Status: AC
  Administered 2011-05-06: 5 mL via INTRAMUSCULAR

## 2011-05-06 MED ORDER — LIDOCAINE HCL (PF) 1 % IJ SOLN
INTRAMUSCULAR | Status: AC
  Administered 2011-05-06: 5 mL via INTRAMUSCULAR
  Filled 2011-05-06: qty 5

## 2011-05-06 MED ORDER — METRONIDAZOLE 500 MG PO TABS
2000.0000 mg | ORAL_TABLET | Freq: Once | ORAL | Status: AC
  Administered 2011-05-06: 2000 mg via ORAL
  Filled 2011-05-06: qty 4

## 2011-05-06 MED ORDER — CEFTRIAXONE SODIUM 250 MG IJ SOLR
250.0000 mg | Freq: Once | INTRAMUSCULAR | Status: AC
  Administered 2011-05-06: 250 mg via INTRAMUSCULAR
  Filled 2011-05-06: qty 250

## 2011-05-06 MED ORDER — AZITHROMYCIN 1 G PO PACK: 1.0000 g | PACK | Freq: Once | ORAL | Status: DC

## 2011-05-06 NOTE — ED Provider Notes (Signed)
History     Chief Complaint  Patient presents with  . Abdominal Pain   chief complaint flank pain History of present illness: Complains of right flank pain radiating to right lower onset a week ago pain is mild at present treat with ibuprofen with partial relief admits to vaginal discharge for 4 days no dysuria no anorexia no fever last bowel movement yesterday normal nothing makes symptoms better or worse .symptoms feel like PID or UTI she's had in the past. Patient is a 35 y.o. female presenting with abdominal pain.  Abdominal Pain The primary symptoms of the illness include abdominal pain and vaginal discharge.    Past Medical History  Diagnosis Date  . Thyroid disease   . GERD (gastroesophageal reflux disease)   . Anxiety   . Depressed     History reviewed. No pertinent past surgical history.  History reviewed. No pertinent family history.  History  Substance Use Topics  . Smoking status: Never Smoker   . Smokeless tobacco: Not on file  . Alcohol Use: No   Social history nonsmoker occasional alcohol no drug use OB History    Grav Para Term Preterm Abortions TAB SAB Ect Mult Living                  Review of Systems  Constitutional: Negative.   HENT: Negative.   Respiratory: Negative.   Cardiovascular: Negative.   Gastrointestinal: Positive for abdominal pain.  Genitourinary: Positive for flank pain and vaginal discharge.  Musculoskeletal: Negative.   Skin: Negative.   Neurological: Negative.   Hematological: Negative.   Psychiatric/Behavioral: Negative.     Physical Exam  BP 104/61  Pulse 67  Temp(Src) 98.7 F (37.1 C) (Oral)  Resp 16  Wt 188 lb (85.276 kg)  SpO2 100%  LMP 04/30/2011  Physical Exam  Nursing note and vitals reviewed. Constitutional: She appears well-developed and well-nourished.  HENT:  Head: Normocephalic and atraumatic.  Eyes: Conjunctivae are normal. Pupils are equal, round, and reactive to light.  Neck: Neck supple. No  tracheal deviation present. No thyromegaly present.  Cardiovascular: Normal rate and regular rhythm.   No murmur heard. Pulmonary/Chest: Effort normal and breath sounds normal.  Abdominal: Soft. Bowel sounds are normal. She exhibits no distension. There is no tenderness.  Genitourinary:       Thick yellow vaginal discharge no external lesion no cervical motion tenderness no adnexal tenderness or masses. No vaginal bleeding. Minimal right flank tenderness  Musculoskeletal: Normal range of motion. She exhibits no edema and no tenderness.  Neurological: She is alert. Coordination normal.  Skin: Skin is warm and dry. No rash noted.  Psychiatric: She has a normal mood and affect.    ED Course  Procedures  MDM In light of Trichomonas in urine, will treat empirically for trichomoniasis and STDs. Abdominal exam is completely benign. Urine will be sent for culture. Patient instructed to take Tylenol for pain. She is to follow up with her primary care doctor, keep scheduled appointment on 05/10/2011      Doug Sou, MD 05/06/11 [phone]

## 2011-05-07 LAB — URINE CULTURE
Culture  Setup Time: 201207190719
Culture: NO GROWTH

## 2011-07-12 LAB — CULTURE, ROUTINE-ABSCESS

## 2011-07-12 LAB — POCT I-STAT, CHEM 8
BUN: 6
Calcium, Ion: 1.23
Chloride: 103
Creatinine, Ser: 0.8
Glucose, Bld: 116 — ABNORMAL HIGH
HCT: 39
Hemoglobin: 13.3
Potassium: 3.9
Sodium: 137
TCO2: 28

## 2011-07-12 LAB — POCT CARDIAC MARKERS
CKMB, poc: <1 — ABNORMAL LOW
Myoglobin, poc: 32.9

## 2011-07-15 LAB — URINALYSIS, ROUTINE W REFLEX MICROSCOPIC
Glucose, UA: NEGATIVE
Hgb urine dipstick: NEGATIVE
Specific Gravity, Urine: <1.005 — ABNORMAL LOW

## 2011-07-15 LAB — FETAL FIBRONECTIN: Fetal Fibronectin: NEGATIVE

## 2011-07-19 LAB — CBC
HCT: 33.8 — ABNORMAL LOW
Hemoglobin: 11.3 — ABNORMAL LOW
Hemoglobin: 9.2 — ABNORMAL LOW
MCHC: 33.5
MCV: 89
RBC: 3.08 — ABNORMAL LOW
WBC: 7.3

## 2011-09-20 ENCOUNTER — Ambulatory Visit
Admission: RE | Admit: 2011-09-20 | Discharge: 2011-09-20 | Disposition: A | Discharge status: Home / Self Care | Payer: Medicaid Other | Source: Ambulatory Visit | Attending: Internal Medicine | Admitting: Internal Medicine

## 2011-09-20 ENCOUNTER — Other Ambulatory Visit: Payer: Self-pay | Admitting: Internal Medicine

## 2011-09-20 DIAGNOSIS — R053 Chronic cough: Secondary | ICD-10-CM

## 2011-09-20 DIAGNOSIS — R05 Cough: Secondary | ICD-10-CM

## 2011-10-04 ENCOUNTER — Other Ambulatory Visit: Payer: Self-pay | Admitting: Physician Assistant

## 2011-10-04 ENCOUNTER — Ambulatory Visit
Admission: RE | Admit: 2011-10-04 | Discharge: 2011-10-04 | Disposition: A | Discharge status: Home / Self Care | Payer: Medicaid Other | Source: Ambulatory Visit | Attending: Physician Assistant | Admitting: Physician Assistant

## 2011-10-04 DIAGNOSIS — M549 Dorsalgia, unspecified: Secondary | ICD-10-CM

## 2011-10-19 HISTORY — PX: OTHER SURGICAL HISTORY: SHX169

## 2011-11-25 ENCOUNTER — Ambulatory Visit (HOSPITAL_BASED_OUTPATIENT_CLINIC_OR_DEPARTMENT_OTHER): Payer: Medicaid Other | Attending: Physician Assistant | Admitting: Radiology

## 2011-11-25 VITALS — Ht 64.0 in | Wt 175.0 lb

## 2011-11-25 DIAGNOSIS — G4730 Sleep apnea, unspecified: Secondary | ICD-10-CM | POA: Insufficient documentation

## 2011-11-25 DIAGNOSIS — G4710 Hypersomnia, unspecified: Secondary | ICD-10-CM | POA: Insufficient documentation

## 2011-11-25 DIAGNOSIS — R0683 Snoring: Secondary | ICD-10-CM

## 2011-11-25 DIAGNOSIS — G4761 Periodic limb movement disorder: Secondary | ICD-10-CM | POA: Insufficient documentation

## 2011-11-28 DIAGNOSIS — G4710 Hypersomnia, unspecified: Secondary | ICD-10-CM

## 2011-11-28 DIAGNOSIS — G4761 Periodic limb movement disorder: Secondary | ICD-10-CM

## 2011-11-28 DIAGNOSIS — G4730 Sleep apnea, unspecified: Secondary | ICD-10-CM

## 2011-11-29 NOTE — Procedures (Signed)
[name], Kayla Mendoza                 ACCOUNT NO.:  [id number]  MEDICAL RECORD NO.:  [id number]          PATIENT TYPE:  OUT  LOCATION:  SLEEP CENTER                 FACILITY:  [facility]  PHYSICIAN:  Clinton D. Maple Hudson, MD, FCCP, FACPDATE OF BIRTH:  02-27-1976  DATE OF STUDY:  11/25/2011                           NOCTURNAL POLYSOMNOGRAM  REFERRING PHYSICIAN:  TRACEY AGUILAR  INDICATION FOR STUDY:  Hypersomnia with sleep apnea.  EPWORTH SLEEPINESS SCORE:  10/24.  BMI 30.  Weight 175 pounds, height 64 inches, neck 12.5 inches.  MEDICATIONS:  Home medications are charted and reviewed.  SLEEP ARCHITECTURE:  Total sleep time 371.5 minutes with sleep efficiency 94.5%.  Stage I was 4.4%, stage II 81.6%, stage III absent, REM 14% of total sleep time.  Sleep latency 10.5 minutes, REM latency 202.5 minutes, awake after sleep onset 11 minutes, arousal index 10.5.  BEDTIME MEDICATION:  None.  RESPIRATORY DATA:  Apnea-hypopnea index (AHI) 1.3 per hour.  A total of 8 events was scored including 2 obstructive apneas, 6 central apneas. The events were seen in all sleep positions.  REM/AHI 4.6 per hour.  OXYGEN DATA:  Moderate to loud snoring with oxygen desaturation to a nadir of 89% and mean oxygen saturation through the study of 96.2% on room air.  CARDIAC DATA:  Normal sinus rhythm.  MOVEMENT-PARASOMNIA:  Extremely frequent limb jerks.  A total of 1327 limb jerks were counted, of which 35 were associated with arousals or awakening for a periodic limb movement with arousal index of 5.7 per hour. No bathroom trips.  IMPRESSIONS-RECOMMENDATIONS: 1. Unremarkable sleep architecture for Sleep Center environment with     no medications. 2. Occasional respiratory event with sleep disturbance, within normal     limits.  AHI 1.3 per hour (the normal range for adult is from 0-5     events per hour).  Moderate to loud snoring with oxygen     desaturation to a nadir of 89% and mean oxygen saturation  through     the study of 96.2% on room air 3. Severe periodic limb movement with arousal.  A total of 1327 limb     jerks were counted, of which 35 were     associated with arousal or awakening for periodic limb movement     with arousal index of 5.7 per hour.  Specific therapy such as     Requip or Mirapex might be considered if clinically appropriate.     Clinton D. Maple Hudson, MD, [facility], FACP Diplomate, Biomedical engineer of Sleep Medicine Electronically Signed    CDY/MEDQ  D:  11/28/2011 11:48:20  T:  11/29/2011 07:12:47  Job:  [id number]

## 2012-01-02 ENCOUNTER — Emergency Department (HOSPITAL_BASED_OUTPATIENT_CLINIC_OR_DEPARTMENT_OTHER)
Admission: EM | Admit: 2012-01-02 | Discharge: 2012-01-02 | Disposition: A | Discharge status: Home / Self Care | Payer: Medicaid Other | Attending: Emergency Medicine | Admitting: Emergency Medicine

## 2012-01-02 ENCOUNTER — Encounter (HOSPITAL_BASED_OUTPATIENT_CLINIC_OR_DEPARTMENT_OTHER): Payer: Self-pay | Admitting: *Deleted

## 2012-01-02 DIAGNOSIS — F329 Major depressive disorder, single episode, unspecified: Secondary | ICD-10-CM | POA: Insufficient documentation

## 2012-01-02 DIAGNOSIS — F3289 Other specified depressive episodes: Secondary | ICD-10-CM | POA: Insufficient documentation

## 2012-01-02 DIAGNOSIS — E079 Disorder of thyroid, unspecified: Secondary | ICD-10-CM | POA: Insufficient documentation

## 2012-01-02 DIAGNOSIS — M545 Low back pain, unspecified: Secondary | ICD-10-CM

## 2012-01-02 DIAGNOSIS — K219 Gastro-esophageal reflux disease without esophagitis: Secondary | ICD-10-CM | POA: Insufficient documentation

## 2012-01-02 DIAGNOSIS — F411 Generalized anxiety disorder: Secondary | ICD-10-CM | POA: Insufficient documentation

## 2012-01-02 MED ORDER — HYDROMORPHONE HCL PF 1 MG/ML IJ SOLN
1.0000 mg | Freq: Once | INTRAMUSCULAR | Status: AC
  Administered 2012-01-02: 1 mg via INTRAMUSCULAR
  Filled 2012-01-02: qty 1

## 2012-01-02 MED ORDER — HYDROCODONE-ACETAMINOPHEN 5-500 MG PO TABS: 1.0000 | ORAL_TABLET | Freq: Four times a day (QID) | ORAL | Status: AC | PRN

## 2012-01-02 NOTE — Discharge Instructions (Signed)
Take motrin or aleve as need for pain.  You may  take vicodin as need for pain. No driving for the next 6 hours or when taking vicodin. Also, do not take tylenol or acetaminophen containing medication when taking vicodin. Avoid bending at waist or heavy lifting more than 20 lbs for the next week. Try heat/heating pad to sore area. Follow up with primary care doctor in coming wee for recheck - discuss referral to back specialist and/or further workup if symptoms fail to improve/resolve. Return to ER if worse, leg numbness/weakness, intractable pain, fevers, other concern.     Back Pain, Adult Low back pain is very common. About 1 in 5 people have back pain.The cause of low back pain is rarely dangerous. The pain often gets better over time.About half of people with a sudden onset of back pain feel better in just 2 weeks. About 8 in 10 people feel better by 6 weeks.  CAUSES Some common causes of back pain include:  Strain of the muscles or ligaments supporting the spine.   Wear and tear (degeneration) of the spinal discs.   Arthritis.   Direct injury to the back.  DIAGNOSIS Most of the time, the direct cause of low back pain is not known.However, back pain can be treated effectively even when the exact cause of the pain is unknown.Answering your caregiver's questions about your overall health and symptoms is one of the most accurate ways to make sure the cause of your pain is not dangerous. If your caregiver needs more information, he or she may order lab work or imaging tests (X-rays or MRIs).However, even if imaging tests show changes in your back, this usually does not require surgery. HOME CARE INSTRUCTIONS For many people, back pain returns.Since low back pain is rarely dangerous, it is often a condition that people can learn to [facility] their own.   Remain active. It is stressful on the back to sit or stand in one place. Do not sit, drive, or stand in one place for more than 30  minutes at a time. Take short walks on level surfaces as soon as pain allows.Try to increase the length of time you walk each day.   Do not stay in bed.Resting more than 1 or 2 days can delay your recovery.   Do not avoid exercise or work.Your body is made to move.It is not dangerous to be active, even though your back may hurt.Your back will likely heal faster if you return to being active before your pain is gone.   Pay attention to your body when you bend and lift. Many people have less discomfortwhen lifting if they bend their knees, keep the load close to their bodies,and avoid twisting. Often, the most comfortable positions are those that put less stress on your recovering back.   Find a comfortable position to sleep. Use a firm mattress and lie on your side with your knees slightly bent. If you lie on your back, put a pillow under your knees.   Only take over-the-counter or prescription medicines as directed by your caregiver. Over-the-counter medicines to reduce pain and inflammation are often the most helpful.Your caregiver may prescribe muscle relaxant drugs.These medicines help dull your pain so you can more quickly return to your normal activities and healthy exercise.   Put ice on the injured area.   Put ice in a plastic bag.   Place a towel between your skin and the bag.   Leave the ice on for 15  to 20 minutes, 3 to 4 times a day for the first 2 to 3 days. After that, ice and heat may be alternated to reduce pain and spasms.   Ask your caregiver about trying back exercises and gentle massage. This may be of some benefit.   Avoid feeling anxious or stressed.Stress increases muscle tension and can worsen back pain.It is important to recognize when you are anxious or stressed and learn ways to manage it.Exercise is a great option.  SEEK MEDICAL CARE IF:  You have pain that is not relieved with rest or medicine.   You have pain that does not improve in 1 week.    You have new symptoms.   You are generally not feeling well.  SEEK IMMEDIATE MEDICAL CARE IF:   You have pain that radiates from your back into your legs.   You develop new bowel or bladder control problems.   You have unusual weakness or numbness in your arms or legs.   You develop nausea or vomiting.   You develop abdominal pain.   You feel faint.  Document Released: 10/04/2005 Document Revised: 09/23/2011 Document Reviewed: 02/22/2011 [facility] Patient Information 2012 [place], [place].    Sciatica Sciatica is a weakness and/or changes in sensation (tingling, jolts, hot and cold, numbness) along the path the sciatic nerve travels. Irritation or damage to lumbar nerve roots is often also referred to as lumbar radiculopathy.  Lumbar radiculopathy (Sciatica) is the most common form of this problem. Radiculopathy can occur in any of the nerves coming out of the spinal cord. The problems caused depend on which nerves are involved. The sciatic nerve is the large nerve supplying the branches of nerves going from the hip to the toes. It often causes a numbness or weakness in the skin and/or muscles that the sciatic nerve serves. It also may cause symptoms (problems) of pain, burning, tingling, or electric shock-like feelings in the path of this nerve. This usually comes from injury to the fibers that make up the sciatic nerve. Some of these symptoms are low back pain and/or unpleasant feelings in the following areas:  From the mid-buttock down the back of the leg to the back of the knee.   And/or the outside of the calf and top of the foot.   And/or behind the inner ankle to the sole of the foot.  CAUSES   Herniated or slipped disc. Discs are the little cushions between the bones in the back.   Pressure by the piriformis muscle in the buttock on the sciatic nerve (Piriformis Syndrome).   Misalignment of the bones in the lower back and buttocks (Sacroiliac Joint Derangement).    Narrowing of the spinal canal that puts pressure on or pinches the fibers that make up the sciatic nerve.   A slipped vertebra that is out of line with those above or beneath it.   Abnormality of the nervous system itself so that nerve fibers do not transmit signals properly, especially to feet and calves (neuropathy).   Tumor (this is rare).  Your caregiver can usually determine the cause of your sciatica and begin the treatment most likely to help you. TREATMENT  Taking over-the-counter painkillers, physical therapy, rest, exercise, spinal manipulation, and injections of anesthetics and/or steroids may be used. Surgery, acupuncture, and Yoga can also be effective. Mind over matter techniques, mental imagery, and changing factors such as your bed, chair, desk height, posture, and activities are other treatments that may be helpful. You and your caregiver can  help determine what is best for you. With proper diagnosis, the cause of most sciatica can be identified and removed. Communication and cooperation between your caregiver and you is essential. If you are not successful immediately, do not be discouraged. With time, a proper treatment can be found that will make you comfortable. HOME CARE INSTRUCTIONS   If the pain is coming from a problem in the back, applying ice to that area for 15 to 20 minutes, 3 to 4 times per day while awake, may be helpful. Put the ice in a plastic bag. Place a towel between the bag of ice and your skin.   You may exercise or perform your usual activities if these do not aggravate your pain, or as suggested by your caregiver.   Only take over-the-counter or prescription medicines for pain, discomfort, or fever as directed by your caregiver.   If your caregiver has given you a follow-up appointment, it is very important to keep that appointment. Not keeping the appointment could result in a chronic or permanent injury, pain, and disability. If there is any problem  keeping the appointment, you must call back to this facility for assistance.  SEEK IMMEDIATE MEDICAL CARE IF:   You experience loss of control of bowel or bladder.   You have increasing weakness in the trunk, buttocks, or legs.   There is numbness in any areas from the hip down to the toes.   You have difficulty walking or keeping your balance.   You have any of the above, with fever or forceful vomiting.  Document Released: 09/28/2001 Document Revised: 09/23/2011 Document Reviewed: 05/17/2008 [facility] Patient Information 2012 [place], [place].

## 2012-01-02 NOTE — ED Notes (Signed)
Hx of back pain pain- pain worse yesterday - took motrin without relief- had steroid shot on Tuesday- also on lyrica

## 2012-01-02 NOTE — ED Provider Notes (Signed)
History     CSN: [id number]  Arrival date & time 01/02/12  1846   First MD Initiated Contact with Patient 01/02/12 1944      Chief Complaint  Patient presents with  . Back Pain    (Consider location/radiation/quality/duration/timing/severity/associated sxs/prior treatment) Patient is a 36 y.o. female presenting with back pain. The history is provided by the patient.  Back Pain  Pertinent negatives include no chest pain, no fever, no numbness, no headaches, no abdominal pain and no weakness.  pt c/o low back pain for past several months. Constant. Occasionally radiates down left leg. States had epidural shot last week which hasnt helped pain as much as previous. No acute or abrupt change in pain today. No numbness/weakness of legs. No perineal numbness. No gi or gu difficulties/retention. No fever or chills. No recent injury or fall. States takes motrin at home without relief. No abd pain. No rash/skin changes to area. No swelling. Constant, dull, radiating pain worse w certain movements/position changes.   Past Medical History  Diagnosis Date  . Thyroid disease   . GERD (gastroesophageal reflux disease)   . Anxiety   . Depressed   . Back pain     History reviewed. No pertinent past surgical history.  No family history on file.  History  Substance Use Topics  . Smoking status: Never Smoker   . Smokeless tobacco: Never Used  . Alcohol Use: Yes     occasionl    OB History    Grav Para Term Preterm Abortions TAB SAB Ect Mult Living                  Review of Systems  Constitutional: Negative for fever and chills.  HENT: Negative for neck pain.   Respiratory: Negative for shortness of breath.   Cardiovascular: Negative for chest pain.  Gastrointestinal: Negative for vomiting, abdominal pain, diarrhea and constipation.  Genitourinary:       No urine retention or incontinence.   Musculoskeletal: Positive for back pain. Negative for gait problem.  Skin: Negative for  rash.  Neurological: Negative for weakness, numbness and headaches.  Hematological: Does not bruise/bleed easily.  Psychiatric/Behavioral: Negative for confusion.    Allergies  Review of patient's allergies indicates no known allergies.  Home Medications   Current Outpatient Rx  Name Route Sig Dispense Refill  . ESCITALOPRAM OXALATE 10 MG PO TABS Oral Take 10 mg by mouth daily.     Marland Kitchen PREGABALIN 50 MG PO CAPS Oral Take 50 mg by mouth 3 (three) times daily.    Marland Kitchen ALPRAZOLAM 1 MG PO TABS Oral Take 0.5-1 mg by mouth as needed. Anxiety/panic attack      BP 120/72  Pulse 64  Temp(Src) 98.3 F (36.8 C) (Oral)  Resp 16  SpO2 100%  LMP 12/30/2011  Physical Exam  Nursing note and vitals reviewed. Constitutional: She is oriented to person, place, and time. She appears well-developed and well-nourished. No distress.  Eyes: Conjunctivae are normal. No scleral icterus.  Neck: Neck supple. No tracheal deviation present.  Cardiovascular: Normal rate.   Pulmonary/Chest: Effort normal. No respiratory distress.  Abdominal: Soft. Normal appearance. She exhibits no distension and no mass. There is no tenderness. There is no rebound and no guarding.  Musculoskeletal: Normal range of motion. She exhibits no edema and no tenderness.       Left lumbar muscular/sciatic notch tenderness. No skin changes, rash or sts. Spine non tender, aligned, no step off. Good rom at hip without pain.  Distal pulses palp.   Neurological: She is alert and oriented to person, place, and time. She displays normal reflexes.       Motor intact bil. Steady gait. Straight leg raise neg.   Skin: Skin is warm and dry. No rash noted.  Psychiatric: She has a normal mood and affect.    ED Course  Procedures (including critical care time)     MDM  Pt/fam requests pain shot. Patient has ride, does not have to drive. Dilaudid 1 mg im.   Pt comfortable.         Suzi Roots, MD 01/02/12 2022

## 2012-01-02 NOTE — ED Notes (Signed)
Pt was on cell phone during entire discharge process.

## 2012-02-10 ENCOUNTER — Other Ambulatory Visit: Payer: Self-pay | Admitting: Neurosurgery

## 2012-02-10 DIAGNOSIS — IMO0002 Reserved for concepts with insufficient information to code with codable children: Secondary | ICD-10-CM

## 2012-02-10 DIAGNOSIS — M545 Low back pain, unspecified: Secondary | ICD-10-CM

## 2012-02-10 DIAGNOSIS — M5137 Other intervertebral disc degeneration, lumbosacral region: Secondary | ICD-10-CM

## 2012-02-10 DIAGNOSIS — M47817 Spondylosis without myelopathy or radiculopathy, lumbosacral region: Secondary | ICD-10-CM

## 2012-02-12 ENCOUNTER — Ambulatory Visit
Admission: RE | Admit: 2012-02-12 | Discharge: 2012-02-12 | Disposition: A | Discharge status: Home / Self Care | Payer: Medicaid Other | Source: Ambulatory Visit | Attending: Neurosurgery | Admitting: Neurosurgery

## 2012-02-12 DIAGNOSIS — M47817 Spondylosis without myelopathy or radiculopathy, lumbosacral region: Secondary | ICD-10-CM

## 2012-02-12 DIAGNOSIS — M545 Low back pain, unspecified: Secondary | ICD-10-CM

## 2012-02-12 DIAGNOSIS — IMO0002 Reserved for concepts with insufficient information to code with codable children: Secondary | ICD-10-CM

## 2012-02-12 DIAGNOSIS — M5137 Other intervertebral disc degeneration, lumbosacral region: Secondary | ICD-10-CM

## 2012-02-29 ENCOUNTER — Ambulatory Visit: Payer: Medicaid Other | Attending: Neurosurgery | Admitting: Physical Therapy

## 2012-02-29 DIAGNOSIS — M545 Low back pain, unspecified: Secondary | ICD-10-CM | POA: Insufficient documentation

## 2012-02-29 DIAGNOSIS — IMO0001 Reserved for inherently not codable concepts without codable children: Secondary | ICD-10-CM | POA: Insufficient documentation

## 2012-03-09 ENCOUNTER — Ambulatory Visit: Payer: Medicaid Other | Admitting: Physical Therapy

## 2012-04-16 ENCOUNTER — Encounter (HOSPITAL_BASED_OUTPATIENT_CLINIC_OR_DEPARTMENT_OTHER): Payer: Self-pay | Admitting: *Deleted

## 2012-04-16 ENCOUNTER — Emergency Department (HOSPITAL_BASED_OUTPATIENT_CLINIC_OR_DEPARTMENT_OTHER)
Admission: EM | Admit: 2012-04-16 | Discharge: 2012-04-16 | Disposition: A | Discharge status: Home / Self Care | Payer: Medicaid Other | Attending: Emergency Medicine | Admitting: Emergency Medicine

## 2012-04-16 DIAGNOSIS — N72 Inflammatory disease of cervix uteri: Secondary | ICD-10-CM | POA: Insufficient documentation

## 2012-04-16 DIAGNOSIS — F341 Dysthymic disorder: Secondary | ICD-10-CM | POA: Insufficient documentation

## 2012-04-16 DIAGNOSIS — K219 Gastro-esophageal reflux disease without esophagitis: Secondary | ICD-10-CM | POA: Insufficient documentation

## 2012-04-16 DIAGNOSIS — E079 Disorder of thyroid, unspecified: Secondary | ICD-10-CM | POA: Insufficient documentation

## 2012-04-16 LAB — URINALYSIS, ROUTINE W REFLEX MICROSCOPIC
Glucose, UA: NEGATIVE mg/dL
Hgb urine dipstick: NEGATIVE
Leukocytes, UA: NEGATIVE
Specific Gravity, Urine: 1.019 (ref 1.005–1.030)
Urobilinogen, UA: 1 mg/dL (ref 0.0–1.0)

## 2012-04-16 LAB — WET PREP, GENITAL
Clue Cells Wet Prep HPF POC: NONE SEEN
Trich, Wet Prep: NONE SEEN

## 2012-04-16 MED ORDER — AZITHROMYCIN 250 MG PO TABS
1000.0000 mg | ORAL_TABLET | Freq: Once | ORAL | Status: AC
  Administered 2012-04-16: 1000 mg via ORAL
  Filled 2012-04-16: qty 4

## 2012-04-16 MED ORDER — CEFTRIAXONE SODIUM 250 MG IJ SOLR
250.0000 mg | Freq: Once | INTRAMUSCULAR | Status: AC
  Administered 2012-04-16: 250 mg via INTRAMUSCULAR
  Filled 2012-04-16: qty 250

## 2012-04-16 MED ORDER — LIDOCAINE HCL (PF) 1 % IJ SOLN
INTRAMUSCULAR | Status: AC
  Administered 2012-04-16: 22:00:00
  Filled 2012-04-16: qty 5

## 2012-04-16 NOTE — ED Provider Notes (Signed)
History/physical exam/procedure(s) were performed by non-physician practitioner and as supervising physician I was immediately available for consultation/collaboration. I have reviewed all notes and am in agreement with care and plan.   Pheonix Clinkscale S [name] Rosenberry, MD 04/16/12 2334 

## 2012-04-16 NOTE — Discharge Instructions (Signed)
Cervicitis   Cervicitis is a soreness and swelling (inflammation) of the cervix. Your cervix is located at the bottom of your uterus which opens up to the vagina.   CAUSES   Sexually transmitted infections (STIs).   Allergic reaction.   Medicines or birth control devices that are put in the vagina.   Injury to the cervix.   Bacterial infections.   SYMPTOMS   There may be no symptoms. If symptoms occur, they may include:   Grey, white, yellow, or bad smelling vaginal discharge.   Pain or itching of the area outside the vagina.   Painful sexual intercourse.   Lower abdominal or lower back pain, especially during intercourse.   Frequent urination.   Abnormal vaginal bleeding between periods, after sexual intercourse, or after menopause.   Pressure or a heavy feeling in the pelvis.   DIAGNOSIS   Diagnosis is made after a pelvic exam. Other tests may include:   Examination of any discharge under a microscope (wet prep).   A Pap test.   TREATMENT   Treatment will depend on the cause of cervicitis. If it is caused by an STI, both you and your partner will need to be treated. Antibiotic medicines will be given.   HOME CARE INSTRUCTIONS   Do not have sexual intercourse until your caregiver says it is okay.   Do not have sexual intercourse until your partner has been treated if your cervicitis is caused by an STI.   Take your antibiotics as directed. Finish them even if you start to feel better.   SEEK IMMEDIATE MEDICAL CARE IF:   Your symptoms come back.   You have a fever.   You experience any problems that may be related to the medicine you are taking.   MAKE SURE YOU:   Understand these instructions.   Will watch your condition.   Will get help right away if you are not doing well or get worse.   Document Released: 10/04/2005 Document Revised: 09/23/2011 Document Reviewed: 05/03/2011   [employer] Patient Information 2012 [employer].

## 2012-04-16 NOTE — ED Provider Notes (Signed)
History     CSN: [id number]  Arrival date & time 04/16/12  2107   First MD Initiated Contact with Patient 04/16/12 2125      Chief Complaint  Patient presents with  . Urinary Tract Infection    (Consider location/radiation/quality/duration/timing/severity/associated sxs/prior treatment) HPI Comments: Pt states that she has had vaginal irritation without discharge and lower back pain:no fever, n/v   Patient is a 36 y.o. female presenting with urinary tract infection.  Urinary Tract Infection This is a new problem. The current episode started in the past 7 days. The problem occurs constantly. The problem has been unchanged. Pertinent negatives include no abdominal pain or fever. Nothing aggravates the symptoms. She has tried nothing for the symptoms.    Past Medical History  Diagnosis Date  . Thyroid disease   . GERD (gastroesophageal reflux disease)   . Anxiety   . Depressed   . Back pain     History reviewed. No pertinent past surgical history.  History reviewed. No pertinent family history.  History  Substance Use Topics  . Smoking status: Never Smoker   . Smokeless tobacco: Never Used  . Alcohol Use: No     occasionl    OB History    Grav Para Term Preterm Abortions TAB SAB Ect Mult Living                  Review of Systems  Constitutional: Negative for fever.  Respiratory: Negative.   Cardiovascular: Negative.   Gastrointestinal: Negative for abdominal pain.  Neurological: Negative.     Allergies  Shellfish allergy  Home Medications   Current Outpatient Rx  Name Route Sig Dispense Refill  . ALPRAZOLAM 1 MG PO TABS Oral Take 0.5-1 mg by mouth 2 (two) times daily as needed. For anxiety    . ESCITALOPRAM OXALATE 10 MG PO TABS Oral Take 10 mg by mouth daily.     Marland Kitchen VALACYCLOVIR HCL 1 G PO TABS Oral Take 1,000 mg by mouth daily.      BP 125/80  Pulse 66  Temp 98.6 F (37 C) (Oral)  Resp 18  SpO2 100%  LMP 04/10/2012  Physical Exam  Nursing  note and vitals reviewed. Constitutional: She is oriented to person, place, and time. She appears well-developed and well-nourished.  HENT:  Head: Normocephalic and atraumatic.  Cardiovascular: Normal rate and regular rhythm.   Pulmonary/Chest: Effort normal and breath sounds normal.  Abdominal: Soft. Bowel sounds are normal. There is no tenderness.  Genitourinary:       yellow vaginal discharge without cmt  Musculoskeletal: Normal range of motion.  Neurological: She is alert and oriented to person, place, and time.  Skin: Skin is warm and dry.    ED Course  Procedures (including critical care time)  Labs Reviewed  WET PREP, GENITAL - Abnormal; Notable for the following:    WBC, Wet Prep HPF POC MANY (*)     All other components within normal limits  PREGNANCY, URINE  URINALYSIS, ROUTINE W REFLEX MICROSCOPIC  GC/CHLAMYDIA PROBE AMP, GENITAL   No results found.   1. Cervicitis       MDM  Pt treated based on exam        Teressa Lower, NP 04/16/12 2333

## 2012-04-16 NOTE — ED Notes (Signed)
Pt with right side and flank pain as well as painful urination

## 2012-04-16 NOTE — ED Notes (Signed)
Pt states that she has a hx of UTI and sx feel similar

## 2012-04-17 LAB — GC/CHLAMYDIA PROBE AMP, GENITAL
Chlamydia, DNA Probe: NEGATIVE
GC Probe Amp, Genital: NEGATIVE

## 2012-07-24 ENCOUNTER — Emergency Department (HOSPITAL_BASED_OUTPATIENT_CLINIC_OR_DEPARTMENT_OTHER): Payer: Medicaid Other

## 2012-07-24 ENCOUNTER — Emergency Department (HOSPITAL_BASED_OUTPATIENT_CLINIC_OR_DEPARTMENT_OTHER)
Admission: EM | Admit: 2012-07-24 | Discharge: 2012-07-24 | Disposition: A | Discharge status: Home / Self Care | Payer: Medicaid Other | Attending: Emergency Medicine | Admitting: Emergency Medicine

## 2012-07-24 ENCOUNTER — Encounter (HOSPITAL_BASED_OUTPATIENT_CLINIC_OR_DEPARTMENT_OTHER): Payer: Self-pay | Admitting: Family Medicine

## 2012-07-24 DIAGNOSIS — K449 Diaphragmatic hernia without obstruction or gangrene: Secondary | ICD-10-CM | POA: Insufficient documentation

## 2012-07-24 DIAGNOSIS — R0602 Shortness of breath: Secondary | ICD-10-CM | POA: Insufficient documentation

## 2012-07-24 DIAGNOSIS — K219 Gastro-esophageal reflux disease without esophagitis: Secondary | ICD-10-CM | POA: Insufficient documentation

## 2012-07-24 DIAGNOSIS — R079 Chest pain, unspecified: Secondary | ICD-10-CM | POA: Insufficient documentation

## 2012-07-24 DIAGNOSIS — R51 Headache: Secondary | ICD-10-CM | POA: Insufficient documentation

## 2012-07-24 LAB — COMPREHENSIVE METABOLIC PANEL
Albumin: 3.5 g/dL (ref 3.5–5.2)
Alkaline Phosphatase: 70 U/L (ref 39–117)
BUN: 9 mg/dL (ref 6–23)
Calcium: 8.7 mg/dL (ref 8.4–10.5)
Creatinine, Ser: 0.8 mg/dL (ref 0.50–1.10)
GFR calc Af Amer: >90 mL/min (ref >90)
Glucose, Bld: 108 mg/dL — ABNORMAL HIGH (ref 70–99)
Potassium: 3.9 mEq/L (ref 3.5–5.1)
Total Protein: 6.7 g/dL (ref 6.0–8.3)

## 2012-07-24 LAB — CBC WITH DIFFERENTIAL/PLATELET
Basophils Relative: 1 % (ref 0–1)
Eosinophils Absolute: 0.4 10*3/uL (ref 0.0–0.7)
Eosinophils Relative: 8 % — ABNORMAL HIGH (ref 0–5)
Hemoglobin: 11.6 g/dL — ABNORMAL LOW (ref 12.0–15.0)
MCH: 28.7 pg (ref 26.0–34.0)
MCHC: 34 g/dL (ref 30.0–36.0)
MCV: 84.4 fL (ref 78.0–100.0)
Monocytes Relative: 6 % (ref 3–12)
Neutrophils Relative %: 54 % (ref 43–77)

## 2012-07-24 LAB — D-DIMER, QUANTITATIVE: D-Dimer, Quant: 0.61 ug/mL-FEU — ABNORMAL HIGH (ref 0.00–0.48)

## 2012-07-24 LAB — TROPONIN I: Troponin I: <0.3 ng/mL (ref <0.30)

## 2012-07-24 MED ORDER — GI COCKTAIL ~~LOC~~
30.0000 mL | Freq: Once | ORAL | Status: AC
  Administered 2012-07-24: 30 mL via ORAL
  Filled 2012-07-24: qty 30

## 2012-07-24 MED ORDER — IOHEXOL 350 MG/ML SOLN
100.0000 mL | Freq: Once | INTRAVENOUS | Status: AC | PRN
  Administered 2012-07-24: 100 mL via INTRAVENOUS

## 2012-07-24 MED ORDER — FAMOTIDINE 20 MG PO TABS: 20.0000 mg | ORAL_TABLET | Freq: Two times a day (BID) | ORAL | Status: DC

## 2012-07-24 MED ORDER — DEXAMETHASONE SODIUM PHOSPHATE 10 MG/ML IJ SOLN
10.0000 mg | Freq: Once | INTRAMUSCULAR | Status: AC
  Administered 2012-07-24: 10 mg via INTRAVENOUS
  Filled 2012-07-24: qty 1

## 2012-07-24 MED ORDER — FAMOTIDINE 20 MG PO TABS
20.0000 mg | ORAL_TABLET | Freq: Once | ORAL | Status: AC
  Administered 2012-07-24: 20 mg via ORAL
  Filled 2012-07-24: qty 1

## 2012-07-24 MED ORDER — METOCLOPRAMIDE HCL 5 MG/ML IJ SOLN
10.0000 mg | Freq: Once | INTRAMUSCULAR | Status: AC
  Administered 2012-07-24: 10 mg via INTRAVENOUS
  Filled 2012-07-24: qty 2

## 2012-07-24 MED ORDER — DIPHENHYDRAMINE HCL 50 MG/ML IJ SOLN
25.0000 mg | Freq: Once | INTRAMUSCULAR | Status: AC
  Administered 2012-07-24: 25 mg via INTRAVENOUS
  Filled 2012-07-24: qty 1

## 2012-07-24 NOTE — ED Notes (Signed)
Pt c/o left sided chest pain since 0850 with tingling sensation in left arm and sts pain is similar to chest pain in past that is normally relieved with anti-anxiety meds. Pt sts today pain is not resolving. Pt drove self to ED.

## 2012-07-24 NOTE — ED Provider Notes (Addendum)
History     CSN: [id number]  Arrival date & time 07/24/12  1024   First MD Initiated Contact with Patient 07/24/12 1038      Chief Complaint  Patient presents with  . Chest Pain    (Consider location/radiation/quality/duration/timing/severity/associated sxs/prior treatment) Patient is a 36 y.o. female presenting with chest pain. The history is provided by the patient.  Chest Pain The chest pain began 1 - 2 hours ago. Duration of episode(s) is 2 hours. Chest pain occurs constantly. The chest pain is unchanged. Associated with: started while sitting in class. At its most intense, the pain is at 6/10. The pain is currently at 6/10. The severity of the pain is moderate. The quality of the pain is described as aching, burning and pressure-like. The pain radiates to the left arm. Primary symptoms include shortness of breath. Pertinent negatives for primary symptoms include no syncope, no cough, no wheezing and no nausea.  Pertinent negatives for associated symptoms include no diaphoresis, no lower extremity edema and no weakness. Treatments tried: xanax. Risk factors include no known risk factors.  Pertinent negatives for past medical history include no CAD, no diabetes, no hyperlipidemia, no hypertension and no MI.  Her family medical history is significant for CAD in family and early MI in family. Family history comments: grandmother died in her 11's and dad died in his 67's of MI  Procedure history is negative for cardiac catheterization.     Past Medical History  Diagnosis Date  . Thyroid disease   . GERD (gastroesophageal reflux disease)   . Anxiety   . Depressed   . Back pain     History reviewed. No pertinent past surgical history.  No family history on file.  History  Substance Use Topics  . Smoking status: Never Smoker   . Smokeless tobacco: Never Used  . Alcohol Use: Yes     occasionl    OB History    Grav Para Term Preterm Abortions TAB SAB Ect Mult Living            Review of Systems  Constitutional: Negative for diaphoresis.  Respiratory: Positive for shortness of breath. Negative for cough and wheezing.   Cardiovascular: Positive for chest pain. Negative for syncope.  Gastrointestinal: Negative for nausea.  Neurological: Positive for headaches. Negative for weakness.  All other systems reviewed and are negative.    Allergies  Shellfish allergy  Home Medications   Current Outpatient Rx  Name Route Sig Dispense Refill  . ALPRAZOLAM 1 MG PO TABS Oral Take 0.5-1 mg by mouth 2 (two) times daily as needed. For anxiety    . ESCITALOPRAM OXALATE 10 MG PO TABS Oral Take 10 mg by mouth daily.     Marland Kitchen VALACYCLOVIR HCL 1 G PO TABS Oral Take 1,000 mg by mouth daily.      BP 139/91  Pulse 67  Temp 97.9 F (36.6 C) (Oral)  Resp 16  Ht 5\' 4"  (1.626 m)  Wt 185 lb (83.915 kg)  BMI 31.76 kg/m2  SpO2 100%  LMP 07/21/2012  Physical Exam  Nursing note and vitals reviewed. Constitutional: She is oriented to person, place, and time. She appears well-developed and well-nourished. No distress.  HENT:  Head: Normocephalic and atraumatic.  Mouth/Throat: Oropharynx is clear and moist.  Eyes: Conjunctivae normal and EOM are normal. Pupils are equal, round, and reactive to light.  Neck: Normal range of motion. Neck supple.  Cardiovascular: Normal rate, regular rhythm and intact distal pulses.   No  murmur heard.      Normal 2+ radial pulse in the left upper ext  Pulmonary/Chest: Effort normal and breath sounds normal. No respiratory distress. She has no wheezes. She has no rales.  Abdominal: Soft. She exhibits no distension. There is no tenderness. There is no rebound and no guarding.       No epigastric pain  Musculoskeletal: Normal range of motion. She exhibits no edema and no tenderness.  Neurological: She is alert and oriented to person, place, and time.  Skin: Skin is warm and dry. No rash noted. No erythema.  Psychiatric: She has a normal  mood and affect. Her behavior is normal.    ED Course  Procedures (including critical care time)  Labs Reviewed  CBC WITH DIFFERENTIAL - Abnormal; Notable for the following:    Hemoglobin 11.6 (*)     HCT 34.1 (*)     Eosinophils Relative 8 (*)     All other components within normal limits  COMPREHENSIVE METABOLIC PANEL - Abnormal; Notable for the following:    Glucose, Bld 108 (*)     Total Bilirubin 0.2 (*)     All other components within normal limits  D-DIMER, QUANTITATIVE - Abnormal; Notable for the following:    D-Dimer, Quant 0.61 (*)     All other components within normal limits  TROPONIN I   Dg Chest 2 View  07/24/2012  *RADIOLOGY REPORT*  Clinical Data: Chest pain  CHEST - 2 VIEW  Comparison: 09/20/2011  Findings: The heart, mediastinum and hilar structures are normal. The lungs are clear and fully expanded.  No effusions or pneumothoraces.  The bony thorax is intact.  IMPRESSION: Normal chest.   Original Report Authenticated By: Mervin Hack, M.D.    Ct Angio Chest Aorta W/cm &/or Wo/cm  07/24/2012  *RADIOLOGY REPORT*  Clinical Data:  Chest pain, question aortic dissection  CT ANGIOGRAPHY CHEST, ABDOMEN AND PELVIS  Technique:  Multidetector CT imaging through the chest, abdomen and pelvis was performed using the standard protocol during bolus administration of intravenous contrast.  Multiplanar reconstructed images including MIPs were obtained and reviewed to evaluate the vascular anatomy.  Contrast: OMNIPAQUE IOHEXOL 350 MG/ML SOLN,  Comparison:  CT abdomen and pelvis 05/02/2009  CTA CHEST  Findings: Small to moderate sized hiatal hernia. No mediastinal hemorrhage or high attenuation intramural aortic hemorrhage. Residual thymic tissue in anterior mediastinum. Aorta normal caliber without aneurysm or dissection. Pulmonary arteries patent without evidence of pulmonary embolism. Multiple normal and upper normal-sized axillary nodes bilaterally. Minimal dependent  atelectasis in the lower lobes. Lungs otherwise clear. No pleural effusion, pneumothorax, or acute osseous findings.   Review of the MIP images confirms the above findings.  IMPRESSION: No evidence of aortic dissection. Small to moderate sized hiatal hernia.  CTA ABDOMEN AND PELVIS  Findings: Aorta normal caliber without aneurysm or dissection. Within limits of technique, no focal abnormalities of the liver, spleen, pancreas, kidneys, or adrenal glands identified. Small umbilical hernia containing fat. Unremarkable appearance of bladder, uterus and adnexae. Normal appendix. Stomach and bowel loops unremarkable for technique. No mass, adenopathy, free fluid or inflammatory process. No acute osseous findings.   Review of the MIP images confirms the above findings.  IMPRESSION: No acute intra abdominal or intrapelvic abnormalities. Small umbilical hernia containing fat.   Original Report Authenticated By: Lollie Marrow, M.D.    Ct Angio Abd/pel W/ And/or W/o  07/24/2012  *RADIOLOGY REPORT*  Clinical Data:  Chest pain, question aortic dissection  CT  ANGIOGRAPHY CHEST, ABDOMEN AND PELVIS  Technique:  Multidetector CT imaging through the chest, abdomen and pelvis was performed using the standard protocol during bolus administration of intravenous contrast.  Multiplanar reconstructed images including MIPs were obtained and reviewed to evaluate the vascular anatomy.  Contrast: OMNIPAQUE IOHEXOL 350 MG/ML SOLN,  Comparison:  CT abdomen and pelvis 05/02/2009  CTA CHEST  Findings: Small to moderate sized hiatal hernia. No mediastinal hemorrhage or high attenuation intramural aortic hemorrhage. Residual thymic tissue in anterior mediastinum. Aorta normal caliber without aneurysm or dissection. Pulmonary arteries patent without evidence of pulmonary embolism. Multiple normal and upper normal-sized axillary nodes bilaterally. Minimal dependent atelectasis in the lower lobes. Lungs otherwise clear. No pleural effusion,  pneumothorax, or acute osseous findings.   Review of the MIP images confirms the above findings.  IMPRESSION: No evidence of aortic dissection. Small to moderate sized hiatal hernia.  CTA ABDOMEN AND PELVIS  Findings: Aorta normal caliber without aneurysm or dissection. Within limits of technique, no focal abnormalities of the liver, spleen, pancreas, kidneys, or adrenal glands identified. Small umbilical hernia containing fat. Unremarkable appearance of bladder, uterus and adnexae. Normal appendix. Stomach and bowel loops unremarkable for technique. No mass, adenopathy, free fluid or inflammatory process. No acute osseous findings.   Review of the MIP images confirms the above findings.  IMPRESSION: No acute intra abdominal or intrapelvic abnormalities. Small umbilical hernia containing fat.   Original Report Authenticated By: Lollie Marrow, M.D.      Date: 07/24/2012  Rate: 74  Rhythm: normal sinus rhythm  QRS Axis: normal  Intervals: normal  ST/T Wave abnormalities: normal  Conduction Disutrbances: none  Narrative Interpretation: unremarkable      1. Hiatal hernia   2. Acid reflux       MDM   Pt with atypical story for CP that started today while she was in class.  Prior hx of similar but usually xanax takes it away.  Pt states worse today and did not improve after xanax.  She also states she has reflux but pain in the chest/back and numbness down the arm.  TIMI 0 but strong family hx of dad in the 6's with death from MI.  PERC neg, however concern for dissection but pt is HD stable and normal EKG but will get d-dimer to further eval.  Most likely GERD and GI cocktail given.  CXR, CBC, BMP, CE, d-dimer.  1:10 PM Dimer is elevated and will get CT which was wnl.  However did show hiatal hernia and pt's sx seem to be most likely from GERD.  Improvement with gi cocktail and pepcid.  Counseled patient about antacid treatment and diet. Patient will return for worsening  2:24 PM Pt with  typical migraine HA without sx suggestive of SAH(sudden onset, worst of life, or deficits), infection, or cavernous vein thrombosis.  Normal neuro exam and vital signs. Will give HA cocktail and on re-eval pt feeling much better.   Gwyneth Sprout, MD 07/24/12 1311  Gwyneth Sprout, MD 07/24/12 1424

## 2012-07-24 NOTE — ED Notes (Signed)
Return of CP and headache noted.

## 2012-07-27 ENCOUNTER — Encounter (HOSPITAL_BASED_OUTPATIENT_CLINIC_OR_DEPARTMENT_OTHER): Payer: Self-pay | Admitting: *Deleted

## 2012-07-27 ENCOUNTER — Emergency Department (HOSPITAL_BASED_OUTPATIENT_CLINIC_OR_DEPARTMENT_OTHER)
Admission: EM | Admit: 2012-07-27 | Discharge: 2012-07-27 | Disposition: A | Discharge status: Home / Self Care | Payer: Medicaid Other | Attending: Emergency Medicine | Admitting: Emergency Medicine

## 2012-07-27 DIAGNOSIS — K219 Gastro-esophageal reflux disease without esophagitis: Secondary | ICD-10-CM | POA: Insufficient documentation

## 2012-07-27 DIAGNOSIS — Z91013 Allergy to seafood: Secondary | ICD-10-CM | POA: Insufficient documentation

## 2012-07-27 DIAGNOSIS — R51 Headache: Secondary | ICD-10-CM | POA: Insufficient documentation

## 2012-07-27 DIAGNOSIS — F3289 Other specified depressive episodes: Secondary | ICD-10-CM | POA: Insufficient documentation

## 2012-07-27 DIAGNOSIS — F411 Generalized anxiety disorder: Secondary | ICD-10-CM | POA: Insufficient documentation

## 2012-07-27 DIAGNOSIS — F329 Major depressive disorder, single episode, unspecified: Secondary | ICD-10-CM | POA: Insufficient documentation

## 2012-07-27 MED ORDER — HYDROMORPHONE HCL PF 1 MG/ML IJ SOLN
1.0000 mg | Freq: Once | INTRAMUSCULAR | Status: AC
  Administered 2012-07-27: 1 mg via INTRAVENOUS
  Filled 2012-07-27: qty 1

## 2012-07-27 MED ORDER — SODIUM CHLORIDE 0.9 % IV SOLN
Freq: Once | INTRAVENOUS | Status: AC
  Administered 2012-07-27: 18:00:00 via INTRAVENOUS

## 2012-07-27 MED ORDER — DIPHENHYDRAMINE HCL 50 MG/ML IJ SOLN
25.0000 mg | Freq: Once | INTRAMUSCULAR | Status: AC
  Administered 2012-07-27: 25 mg via INTRAVENOUS
  Filled 2012-07-27: qty 1

## 2012-07-27 MED ORDER — METOCLOPRAMIDE HCL 5 MG/ML IJ SOLN
10.0000 mg | Freq: Once | INTRAMUSCULAR | Status: AC
  Administered 2012-07-27: 10 mg via INTRAVENOUS
  Filled 2012-07-27: qty 2

## 2012-07-27 MED ORDER — KETOROLAC TROMETHAMINE 30 MG/ML IJ SOLN
30.0000 mg | Freq: Once | INTRAMUSCULAR | Status: AC
  Administered 2012-07-27: 30 mg via INTRAVENOUS
  Filled 2012-07-27: qty 1

## 2012-07-27 NOTE — ED Notes (Addendum)
Headache. States she was here on Monday for same. Saw her MD on Tuesday and was started on Tramadol.  Headache is no better.

## 2012-07-27 NOTE — ED Provider Notes (Signed)
History     CSN: [id number]  Arrival date & time 07/27/12  1509   First MD Initiated Contact with Patient 07/27/12 1653      Chief Complaint  Patient presents with  . Headache    (Consider location/radiation/quality/duration/timing/severity/associated sxs/prior treatment) Patient is a 36 y.o. female presenting with headaches. The history is provided by the patient. No language interpreter was used.  Headache  This is a new problem. The current episode started more than 2 days ago. The problem has been gradually worsening. The headache is associated with nothing. The pain is at a severity of 8/10. She has tried nothing for the symptoms. The treatment provided moderate relief.  Pt complains of a right sided headache.  Pt reports no relief from medications.    Past Medical History  Diagnosis Date  . Thyroid disease   . GERD (gastroesophageal reflux disease)   . Anxiety   . Depressed   . Back pain     History reviewed. No pertinent past surgical history.  No family history on file.  History  Substance Use Topics  . Smoking status: Never Smoker   . Smokeless tobacco: Never Used  . Alcohol Use: Yes     occasionl    OB History    Grav Para Term Preterm Abortions TAB SAB Ect Mult Living                  Review of Systems  Neurological: Positive for headaches.  All other systems reviewed and are negative.    Allergies  Shellfish allergy  Home Medications   Current Outpatient Rx  Name Route Sig Dispense Refill  . TRAMADOL HCL PO Oral Take by mouth.    . ALPRAZOLAM 1 MG PO TABS Oral Take 0.5-1 mg by mouth 2 (two) times daily as needed. For anxiety    . ESCITALOPRAM OXALATE 10 MG PO TABS Oral Take 10 mg by mouth daily.     Marland Kitchen FAMOTIDINE 20 MG PO TABS Oral Take 1 tablet (20 mg total) by mouth 2 (two) times daily. 28 tablet 0  . VALACYCLOVIR HCL 1 G PO TABS Oral Take 1,000 mg by mouth daily.      BP 128/78  Pulse 78  Temp 98 F (36.7 C) (Oral)  Resp 20  SpO2  100%  LMP 07/21/2012  Physical Exam  Nursing note and vitals reviewed. Constitutional: She is oriented to person, place, and time. She appears well-developed and well-nourished.  HENT:  Head: Normocephalic and atraumatic.  Right Ear: External ear normal.  Left Ear: External ear normal.  Nose: Nose normal.  Mouth/Throat: Oropharynx is clear and moist.  Eyes: Conjunctivae normal and EOM are normal. Pupils are equal, round, and reactive to light.  Neck: Normal range of motion. Neck supple.  Cardiovascular: Normal rate and normal heart sounds.   Pulmonary/Chest: Effort normal.  Musculoskeletal: Normal range of motion.  Neurological: She is alert and oriented to person, place, and time. She has normal reflexes.  Skin: Skin is warm.  Psychiatric: She has a normal mood and affect.    ED Course  Procedures (including critical care time)  Labs Reviewed - No data to display No results found.   No diagnosis found.    MDM  Pt reports some relief with reglan, torodol and benadryl.           Lonia Skinner [place], [place] 07/27/12 2005

## 2012-07-27 NOTE — ED Notes (Signed)
Vital signs stable. 

## 2012-07-28 NOTE — ED Provider Notes (Signed)
Medical screening examination/treatment/procedure(s) were performed by non-physician practitioner and as supervising physician I was immediately available for consultation/collaboration.   Glynn Octave, MD 07/28/12 [phone]

## 2012-09-05 ENCOUNTER — Other Ambulatory Visit: Payer: Self-pay | Admitting: Neurology

## 2012-09-05 DIAGNOSIS — H5310 Unspecified subjective visual disturbances: Secondary | ICD-10-CM

## 2012-09-05 DIAGNOSIS — G43019 Migraine without aura, intractable, without status migrainosus: Secondary | ICD-10-CM

## 2012-09-06 ENCOUNTER — Ambulatory Visit
Admission: RE | Admit: 2012-09-06 | Discharge: 2012-09-06 | Disposition: A | Discharge status: Home / Self Care | Payer: Medicaid Other | Source: Ambulatory Visit | Attending: Neurology | Admitting: Neurology

## 2012-09-06 DIAGNOSIS — G43019 Migraine without aura, intractable, without status migrainosus: Secondary | ICD-10-CM

## 2012-09-06 DIAGNOSIS — H5310 Unspecified subjective visual disturbances: Secondary | ICD-10-CM

## 2012-10-28 ENCOUNTER — Encounter (HOSPITAL_BASED_OUTPATIENT_CLINIC_OR_DEPARTMENT_OTHER): Payer: Self-pay | Admitting: *Deleted

## 2012-10-28 ENCOUNTER — Emergency Department (HOSPITAL_BASED_OUTPATIENT_CLINIC_OR_DEPARTMENT_OTHER)
Admission: EM | Admit: 2012-10-28 | Discharge: 2012-10-28 | Disposition: A | Discharge status: Home / Self Care | Payer: Medicaid Other | Attending: Emergency Medicine | Admitting: Emergency Medicine

## 2012-10-28 ENCOUNTER — Emergency Department (HOSPITAL_BASED_OUTPATIENT_CLINIC_OR_DEPARTMENT_OTHER): Payer: Medicaid Other

## 2012-10-28 DIAGNOSIS — IMO0002 Reserved for concepts with insufficient information to code with codable children: Secondary | ICD-10-CM | POA: Insufficient documentation

## 2012-10-28 DIAGNOSIS — E079 Disorder of thyroid, unspecified: Secondary | ICD-10-CM | POA: Insufficient documentation

## 2012-10-28 DIAGNOSIS — F411 Generalized anxiety disorder: Secondary | ICD-10-CM | POA: Insufficient documentation

## 2012-10-28 DIAGNOSIS — F3289 Other specified depressive episodes: Secondary | ICD-10-CM | POA: Insufficient documentation

## 2012-10-28 DIAGNOSIS — Z79899 Other long term (current) drug therapy: Secondary | ICD-10-CM | POA: Insufficient documentation

## 2012-10-28 DIAGNOSIS — K219 Gastro-esophageal reflux disease without esophagitis: Secondary | ICD-10-CM | POA: Insufficient documentation

## 2012-10-28 DIAGNOSIS — F329 Major depressive disorder, single episode, unspecified: Secondary | ICD-10-CM | POA: Insufficient documentation

## 2012-10-28 DIAGNOSIS — Z8679 Personal history of other diseases of the circulatory system: Secondary | ICD-10-CM | POA: Insufficient documentation

## 2012-10-28 DIAGNOSIS — M549 Dorsalgia, unspecified: Secondary | ICD-10-CM

## 2012-10-28 MED ORDER — HYDROCODONE-ACETAMINOPHEN 5-325 MG PO TABS
2.0000 | ORAL_TABLET | Freq: Once | ORAL | Status: AC
  Administered 2012-10-28: 2 via ORAL
  Filled 2012-10-28: qty 2

## 2012-10-28 MED ORDER — HYDROCODONE-ACETAMINOPHEN 5-325 MG PO TABS: 2.0000 | ORAL_TABLET | ORAL | Status: DC | PRN

## 2012-10-28 MED ORDER — IBUPROFEN 800 MG PO TABS: 800.0000 mg | ORAL_TABLET | Freq: Three times a day (TID) | ORAL | Status: DC
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
## 2012-10-28 NOTE — ED Provider Notes (Signed)
Medical screening examination/treatment/procedure(s) were performed by non-physician practitioner and as supervising physician I was immediately available for consultation/collaboration.   Jaymee Tilson B. Bernette Mayers, MD 10/28/12 1353
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
## 2013-04-16 ENCOUNTER — Telehealth: Payer: Self-pay | Admitting: Neurology

## 2013-06-27 ENCOUNTER — Telehealth: Payer: Self-pay | Admitting: *Deleted

## 2013-06-27 NOTE — Telephone Encounter (Signed)
Patient confirmed

## 2013-06-28 ENCOUNTER — Encounter: Payer: Self-pay | Admitting: Nurse Practitioner

## 2013-06-28 ENCOUNTER — Ambulatory Visit (INDEPENDENT_AMBULATORY_CARE_PROVIDER_SITE_OTHER): Payer: Medicaid Other | Admitting: Nurse Practitioner

## 2013-06-28 VITALS — BP 106/68 | HR 83 | Ht 64.5 in | Wt 188.0 lb

## 2013-06-28 DIAGNOSIS — G43019 Migraine without aura, intractable, without status migrainosus: Secondary | ICD-10-CM | POA: Insufficient documentation

## 2013-06-28 DIAGNOSIS — G44209 Tension-type headache, unspecified, not intractable: Secondary | ICD-10-CM

## 2013-06-28 MED ORDER — BUTALBITAL-APAP-CAFFEINE 50-325-40 MG PO TABS: 1.0000 | ORAL_TABLET | Freq: Two times a day (BID) | ORAL | Status: DC | PRN

## 2013-06-28 MED ORDER — TIZANIDINE HCL 2 MG PO TABS: 4.0000 mg | ORAL_TABLET | Freq: Every day | ORAL | Status: DC

## 2013-06-28 NOTE — Patient Instructions (Addendum)
Patient to continue tizanidine does not need  Refills Continue daily stretching and activities and exercises If headaches increase keep a diary Followup in 6 months

## 2013-06-28 NOTE — Progress Notes (Signed)
Reason for visit followup for migraine  HPI: Ms. Anagnos, 37 year old black female returns for followup. Migraines are in fairly good control, she did not bring a headache diary. She occasionally will take Fioricet if she has a bad headache otherwise she takes tizanidine. Most of her headaches are due to stress and she does have an anxiety disorder. She also has a history of tension headaches. She has denied any food triggers in the past.   HISTORY: She denies h/o prior migraines has family history of migraine in her sister.She did have few headaches off and on since last month and now daily headaches since last 2 weeks. The headaches begin in right retroorbital region and quickly spreads to the other side.The headache is mostly severe and throbbing and 8-10/10 in severity but she also acknowledges a 5/10 pressure like headche which has persisted daily for last 2 weeks.She has tried NSAIDs without relief but fioricet and topamax stated in last 3 days seem to help quite a bit but headche is not yet gone.she has some visual obscurations at onset of headche, nausea, light and sound sensitivity.Headache is decreased by sleep and increased with physical activity.She has not had any work up or brain imaging study yet.She denies h/o migraines but her sister has migraines. 10/12/12: She returns for followup after last visit on 10/17/13n. She did not tolerate Topamax due to lack of efficacy as well as a rash iand stopped it in 3 weeks. She continues to have almost daily headaches which him mild to moderate in severity. She she takes Fioricet which helps but needs to take one tablet daily. She did get improved relief with Medrol Dosepak but then the effect wore off the headaches return. She states he does not sleep well. She has been doing daily next exercises which help. She is unable to and for specific triggers for her headaches. MRI scan of the brain on 09/06/12 showed solitary tiny nonspecific white matter  hyperintensity in the left peri insular region. MRA of the brain was normal.    ROS:  14 system review of symptoms is negative except for the following   Medications Current Outpatient Prescriptions on File Prior to Visit  Medication Sig Dispense Refill  . ALPRAZolam (XANAX) 1 MG tablet Take 0.5-1 mg by mouth 2 (two) times daily as needed. For anxiety      . escitalopram (LEXAPRO) 10 MG tablet Take 10 mg by mouth daily.       Marland Kitchen ibuprofen (ADVIL,MOTRIN) 800 MG tablet Take 1 tablet (800 mg total) by mouth 3 (three) times daily.  21 tablet  0  . OMEPRAZOLE PO Take by mouth.      Marland Kitchen TIZANIDINE HCL PO Take by mouth.      . valACYclovir (VALTREX) 1000 MG tablet Take 1,000 mg by mouth daily.       No current facility-administered medications on file prior to visit.    Allergies  Allergies  Allergen Reactions  . Shellfish Allergy Hives    Physical Exam General: well developed, well nourished, seated, in no evident distress Head: head normocephalic and atraumatic. Oropharynx benign Neck: supple with no carotid  bruits Cardiovascular: regular rate and rhythm, no murmurs  Neurologic Exam Mental Status: Awake and fully alert. Oriented to place and time. Follows all commands. Speech and language normal.   Cranial Nerves:  Pupils equal, briskly reactive to light. Extraocular movements full without nystagmus. Visual fields full to confrontation. Hearing intact and symmetric to finger snap. Facial sensation intact. Face, tongue,  palate move normally and symmetrically. Neck flexion and extension normal.  Motor: Normal bulk and tone. Normal strength in all tested extremity muscles.No focal weakness Sensory.: intact to touch and pinprick and vibratory.  Coordination: Rapid alternating movements normal in all extremities. Finger-to-nose and heel-to-shin performed accurately bilaterally. Gait and Station: Arises from chair without difficulty. Stance is normal. Gait demonstrates normal stride length  and balance . Able to heel, toe and tandem walk without difficulty.  Reflexes: 2+ and symmetric. Toes downgoing.     ASSESSMENT: Mixed headache, migraine and tension currently in good control.     PLAN:  Patient to continue tizanidine will refill Continue daily stretching and activities and exercises If headaches increase keep a diary Followup in 6 months  Nilda Riggs, GNP-BC APRN

## 2013-07-23 ENCOUNTER — Ambulatory Visit (INDEPENDENT_AMBULATORY_CARE_PROVIDER_SITE_OTHER): Payer: Medicaid Other | Admitting: Neurology

## 2013-07-23 ENCOUNTER — Encounter: Payer: Self-pay | Admitting: Neurology

## 2013-07-23 VITALS — BP 114/78 | HR 68 | Ht 64.5 in | Wt 188.0 lb

## 2013-07-23 DIAGNOSIS — M549 Dorsalgia, unspecified: Secondary | ICD-10-CM | POA: Insufficient documentation

## 2013-07-23 DIAGNOSIS — M5416 Radiculopathy, lumbar region: Secondary | ICD-10-CM | POA: Insufficient documentation

## 2013-07-23 DIAGNOSIS — IMO0002 Reserved for concepts with insufficient information to code with codable children: Secondary | ICD-10-CM

## 2013-07-23 MED ORDER — TRAMADOL HCL 50 MG PO TABS: 100.0000 mg | ORAL_TABLET | Freq: Four times a day (QID) | ORAL | Status: DC | PRN
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
## 2013-07-23 NOTE — Patient Instructions (Addendum)
Start tramadol 100 mg every 6 hourly when necessary for back pain as well as Zanaflex 2 mg at night for one week to be increased to twice daily. I advised her to do regular back stretching exercises as well as local heat application. Check MRI scan of the lumbar spine and EMG /nerve conduction study. Return for followup in 6 weeks or call earlier if necessary.

## 2013-07-26 ENCOUNTER — Encounter (INDEPENDENT_AMBULATORY_CARE_PROVIDER_SITE_OTHER): Payer: Self-pay

## 2013-07-26 ENCOUNTER — Ambulatory Visit (INDEPENDENT_AMBULATORY_CARE_PROVIDER_SITE_OTHER): Payer: Medicaid Other | Admitting: Neurology

## 2013-07-26 DIAGNOSIS — M545 Low back pain, unspecified: Secondary | ICD-10-CM

## 2013-07-26 DIAGNOSIS — M549 Dorsalgia, unspecified: Secondary | ICD-10-CM

## 2013-07-26 DIAGNOSIS — Z0289 Encounter for other administrative examinations: Secondary | ICD-10-CM

## 2013-07-26 DIAGNOSIS — M5416 Radiculopathy, lumbar region: Secondary | ICD-10-CM

## 2013-07-26 NOTE — Progress Notes (Signed)
Emg/ncs study today is normal.

## 2013-07-26 NOTE — Procedures (Signed)
    [facility]  NCS (NERVE CONDUCTION STUDY) WITH EMG (ELECTROMYOGRAPHY) REPORT   STUDY DATE: 07/26/2013 PATIENT NAME: Kayla Mendoza DOB: 03/26/1976 MRN: [id number]    TECHNOLOGIST: Gearldine Shown ELECTROMYOGRAPHER: Levert Feinstein M.D.  CLINICAL INFORMATION:  37 yr RH AAF with low back pain, radiating pain to her right leg.  On examination, bilateral lower extremity motor strength was normal, sensory was preserved to light touch pinprick, vibratory sensation, deep tendon reflexes were present and symmetric    FINDINGS: NERVE CONDUCTION STUDY: Bilateral peroneal sensory responses were normal. Bilateral peroneal to EDB, and tibial motor responses were normal.   Bilateral tibial H. reflexes were normal and symmetric     NEEDLE ELECTROMYOGRAPHY: Selected needle examination was performed at right lower extremity muscles and the right lumbosacral paraspinal muscles.  Needle examination of the right tibialis anterior, tibialis posterior, medial gastrocnemius, vastus lateralis, biceps femoris long head was normal There was no spontaneous activity at right lumbosacral paraspinal muscles, right L4, 5, S1,  IMPRESSION:    This is a normal study. There is no electrodiagnostic evidence of large fiber peripheral neuropathy, right lumbosacral radiculopathy, or myopathy   INTERPRETING PHYSICIAN:   Levert Feinstein M.D. Ph.D. [facility] [address] [place], [place] [address] [phone]

## 2013-07-30 NOTE — Progress Notes (Signed)
Quick Note:  I called and LMVM for pt that results of [place]/EMG normal, no pinched nerve or neuropathy. He is to call back if questions. ______

## 2013-07-31 ENCOUNTER — Telehealth: Payer: Self-pay | Admitting: Neurology

## 2013-07-31 ENCOUNTER — Other Ambulatory Visit: Payer: Self-pay | Admitting: Gastroenterology

## 2013-07-31 DIAGNOSIS — R1012 Left upper quadrant pain: Secondary | ICD-10-CM

## 2013-08-01 NOTE — Telephone Encounter (Signed)
Pt taking tramadol 100mg  po at least 3 times a day and does not make a difference with her back pain.  She is taking zanaflex 4mg  po qhs and since her EMG has noted increased contractions of the left foot.  Please advise.

## 2013-08-01 NOTE — Telephone Encounter (Signed)
Stop tramadol and increase zanaflex to 4 mg twice daily

## 2013-08-02 ENCOUNTER — Inpatient Hospital Stay: Admission: RE | Admit: 2013-08-02 | Payer: Medicaid Other | Source: Ambulatory Visit

## 2013-08-02 NOTE — Telephone Encounter (Signed)
I called and LMVM for pt that of below and is to call back if continues with problems.

## 2013-08-07 ENCOUNTER — Ambulatory Visit
Admission: RE | Admit: 2013-08-07 | Discharge: 2013-08-07 | Disposition: A | Discharge status: Home / Self Care | Payer: Medicaid Other | Source: Ambulatory Visit | Attending: Gastroenterology | Admitting: Gastroenterology

## 2013-08-07 DIAGNOSIS — R1012 Left upper quadrant pain: Secondary | ICD-10-CM

## 2013-09-06 ENCOUNTER — Encounter: Payer: Self-pay | Admitting: Neurology

## 2013-09-06 ENCOUNTER — Encounter (INDEPENDENT_AMBULATORY_CARE_PROVIDER_SITE_OTHER): Payer: Self-pay

## 2013-09-06 ENCOUNTER — Ambulatory Visit (INDEPENDENT_AMBULATORY_CARE_PROVIDER_SITE_OTHER): Payer: Medicaid Other | Admitting: Neurology

## 2013-09-06 VITALS — BP 126/87 | HR 93 | Temp 97.9°F | Ht 64.0 in | Wt 184.0 lb

## 2013-09-06 DIAGNOSIS — M549 Dorsalgia, unspecified: Secondary | ICD-10-CM
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
## 2013-09-06 NOTE — Patient Instructions (Signed)
Continue Zanaflex 4 mg twice daily for pain and do regular neck and back stretching exercises. Check MRI scan of the lumbar spine if approved by insurance this time. Return for followup in 2 months with Larita Fife, NP

## 2013-09-16 ENCOUNTER — Inpatient Hospital Stay: Admission: RE | Admit: 2013-09-16 | Payer: Medicaid Other | Source: Ambulatory Visit

## 2013-10-09 ENCOUNTER — Telehealth: Payer: Self-pay | Admitting: *Deleted

## 2013-10-18 HISTORY — PX: ABDOMINAL HYSTERECTOMY: SHX81

## 2013-10-23 ENCOUNTER — Telehealth: Payer: Self-pay | Admitting: Neurology

## 2013-10-23 DIAGNOSIS — M549 Dorsalgia, unspecified: Secondary | ICD-10-CM

## 2013-10-24 MED ORDER — ACETAMINOPHEN-CODEINE #3 300-30 MG PO TABS: 1.0000 | ORAL_TABLET | Freq: Four times a day (QID) | ORAL | Status: DC | PRN

## 2013-10-24 NOTE — Telephone Encounter (Signed)
I called pt and let her know I will fax.  She will call pharm prior to p/u.
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
## 2013-10-24 NOTE — Telephone Encounter (Signed)
Consulted Dr. Leonie Man.   He will try tylenol # 3 # 30 tabs, no refill 1 tablet po every 6 hours prn pain.  She has MRI L spine scheduled for this weekend.

## 2013-10-27 ENCOUNTER — Ambulatory Visit
Admission: RE | Admit: 2013-10-27 | Discharge: 2013-10-27 | Disposition: A | Discharge status: Home / Self Care | Payer: Medicaid Other | Source: Ambulatory Visit | Attending: Neurology | Admitting: Neurology

## 2013-10-27 DIAGNOSIS — M549 Dorsalgia, unspecified: Secondary | ICD-10-CM

## 2013-10-27 DIAGNOSIS — M5416 Radiculopathy, lumbar region: Secondary | ICD-10-CM

## 2013-10-27 MED ORDER — GADOBENATE DIMEGLUMINE 529 MG/ML IV SOLN
17.0000 mL | Freq: Once | INTRAVENOUS | Status: AC | PRN
  Administered 2013-10-27: 17 mL via INTRAVENOUS

## 2013-10-30 ENCOUNTER — Telehealth: Payer: Self-pay | Admitting: *Deleted

## 2013-10-30 NOTE — Telephone Encounter (Signed)
Dr Leonie Man already processed this Rx last week.  There is a note in the chart from [place] saying she faxed the Rx.  I called the patient, advised her the Rx has been faxed.  She said the pharmacy didn't fill it.  I called the pharmacy.  Spoke with [facility].  She said they did not fill the Rx because they did not have the med in stock.  They will fill the Rx now.  I called the patient back.  She is aware and will follow up with the pharmacy.

## 2013-11-06 ENCOUNTER — Telehealth: Payer: Self-pay | Admitting: Neurology

## 2013-11-06 NOTE — Telephone Encounter (Signed)
Patient requesting results of her MRI done on 10/27/2012. Please call with results.

## 2013-11-07 NOTE — Telephone Encounter (Signed)
Informed patient that results are not ready as of yet, will call back when available and reviewed by physician, she verbalized understanding.

## 2013-12-11 ENCOUNTER — Ambulatory Visit: Payer: Medicaid Other | Admitting: Nurse Practitioner

## 2014-01-03 ENCOUNTER — Encounter (INDEPENDENT_AMBULATORY_CARE_PROVIDER_SITE_OTHER): Payer: Self-pay

## 2014-01-03 ENCOUNTER — Ambulatory Visit (INDEPENDENT_AMBULATORY_CARE_PROVIDER_SITE_OTHER): Payer: Medicaid Other | Admitting: Nurse Practitioner

## 2014-01-03 ENCOUNTER — Encounter: Payer: Self-pay | Admitting: Nurse Practitioner

## 2014-01-03 VITALS — BP 129/88 | HR 66 | Ht 65.0 in | Wt 175.0 lb

## 2014-01-03 DIAGNOSIS — IMO0002 Reserved for concepts with insufficient information to code with codable children: Secondary | ICD-10-CM

## 2014-01-03 DIAGNOSIS — M549 Dorsalgia, unspecified: Secondary | ICD-10-CM

## 2014-01-03 DIAGNOSIS — M5416 Radiculopathy, lumbar region: Secondary | ICD-10-CM

## 2014-01-03 NOTE — Patient Instructions (Addendum)
PLAN:  Your MRI Lumbar Spine shows disc bulging at the L2 level, we recommend seeing an Orthopedic Doctor to have cortisone injection to that area.  Your primary doctor will need to refer you.  Continue Lyrica as needed for pain.    Return for followup as needed.    Back Pain, Adult Low back pain is very common. About 1 in 5 people have back pain.The cause of low back pain is rarely dangerous. The pain often gets better over time.About half of people with a sudden onset of back pain feel better in just 2 weeks. About 8 in 10 people feel better by 6 weeks.  CAUSES Some common causes of back pain include:  Strain of the muscles or ligaments supporting the spine.  Wear and tear (degeneration) of the spinal discs.  Arthritis.  Direct injury to the back. DIAGNOSIS Most of the time, the direct cause of low back pain is not known.However, back pain can be treated effectively even when the exact cause of the pain is unknown.Answering your caregiver's questions about your overall health and symptoms is one of the most accurate ways to make sure the cause of your pain is not dangerous. If your caregiver needs more information, he or she may order lab work or imaging tests (X-rays or MRIs).However, even if imaging tests show changes in your back, this usually does not require surgery. HOME CARE INSTRUCTIONS For many people, back pain returns.Since low back pain is rarely dangerous, it is often a condition that people can learn to [facility] their own.   Remain active. It is stressful on the back to sit or stand in one place. Do not sit, drive, or stand in one place for more than 30 minutes at a time. Take short walks on level surfaces as soon as pain allows.Try to increase the length of time you walk each day.  Do not stay in bed.Resting more than 1 or 2 days can delay your recovery.  Do not avoid exercise or work.Your body is made to move.It is not dangerous to be active, even though  your back may hurt.Your back will likely heal faster if you return to being active before your pain is gone.  Pay attention to your body when you bend and lift. Many people have less discomfortwhen lifting if they bend their knees, keep the load close to their bodies,and avoid twisting. Often, the most comfortable positions are those that put less stress on your recovering back.  Find a comfortable position to sleep. Use a firm mattress and lie on your side with your knees slightly bent. If you lie on your back, put a pillow under your knees.  Only take over-the-counter or prescription medicines as directed by your caregiver. Over-the-counter medicines to reduce pain and inflammation are often the most helpful.Your caregiver may prescribe muscle relaxant drugs.These medicines help dull your pain so you can more quickly return to your normal activities and healthy exercise.  Put ice on the injured area.  Put ice in a plastic bag.  Place a towel between your skin and the bag.  Leave the ice on for 15-20 minutes, 03-04 times a day for the first 2 to 3 days. After that, ice and heat may be alternated to reduce pain and spasms.  Ask your caregiver about trying back exercises and gentle massage. This may be of some benefit.  Avoid feeling anxious or stressed.Stress increases muscle tension and can worsen back pain.It is important to recognize when you are  anxious or stressed and learn ways to manage it.Exercise is a great option. SEEK MEDICAL CARE IF:  You have pain that is not relieved with rest or medicine.  You have pain that does not improve in 1 week.  You have new symptoms.  You are generally not feeling well. SEEK IMMEDIATE MEDICAL CARE IF:   You have pain that radiates from your back into your legs.  You develop new bowel or bladder control problems.  You have unusual weakness or numbness in your arms or legs.  You develop nausea or vomiting.  You develop abdominal  pain.  You feel faint. Document Released: 10/04/2005 Document Revised: 04/04/2012 Document Reviewed: 02/22/2011 [facility] Patient Information 2014 [place], [place].
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
## 2014-02-26 ENCOUNTER — Other Ambulatory Visit: Payer: Self-pay | Admitting: Urology

## 2014-02-27 DIAGNOSIS — Z0289 Encounter for other administrative examinations: Secondary | ICD-10-CM

## 2014-03-04 NOTE — H&P (Signed)
History of Present Illness     F/u - referred by Dr. [name]. Her PCP is Dr. Abby Potash.     1- LUTS, pelvic pain - April 2015-patient developed pain in in the right flank which radiates down into right pelvis. She gets some urgency and if she delays voiding pain increases. Pain improved with voiding. She has throbbing pain in lower abdomen. Nocturia x 1. She voids a lot. Her urinalysis showed 3-10 red blood cells per high-powered field. No h/o of kidney stones and no stone passage. She voids frequently. No gross hematuria or dysuria. She voids with a good stream. She tried abx. Ibuprofen helps.       2-MH -Apr 2015 - A UA one month ago showed 3-10 rbc's per hpf. She has no exposures- no chemo, xrt, smoking, chemical exposures. She has a history of back pain, left upper quadrant pain and right lower quadrant pain. She is undergone multiple imaging studies. January 2014 she underwent L-spine imaging which showed mild scoliosis and otherwise was normal. October 2014 she underwent abdominal ultrasound which showed a normal gallbladder normal kidneys. January 2015 she underwent an MRI of the lumbar spine but I did not see report for this (DDD and bulging disks). March 2015 she underwent CT scan of the abdomen and pelvis for right lower quadrant pain which were again revealed normal kidneys and bladder. I reviewed the images from each of these studies.      May 2015 interval hx  She returns and has been diagnosed with adenomyosis and may need a hysterectomy. She tried the Vesicare which helped her frequency and urgency. At baseline she has some urgency but not a lot of frequency. She has suprapubic pain or pressure when she has urgency. She underwent a back injection with helped her back pain she continued to have pelvic pain.    Past Medical History Problems  1. History of Anxiety (300.00) 2. History of depression (V11.8) 3. History of endoscopy (V45.89) 4. History of esophageal  reflux (V12.79) 5. History of gonorrhea (V12.09) 6. History of herpes simplex infection (V12.09) 7. History of hiatal hernia (V12.79) 8. History of hyperthyroidism (V12.29)  Surgical History Problems  1. History of Tubal Ligation  Current Meds 1. Amoxicillin 500 MG Oral Tablet;  Therapy: (Recorded:13Apr2015) to Recorded 2. Lyrica 75 MG Oral Capsule;  Therapy: (Recorded:13Apr2015) to Recorded 3. TiZANidine HCl - 4 MG Oral Tablet;  Therapy: (Recorded:13Apr2015) to Recorded 4. Valtrex 1 GM Oral Tablet;  Therapy: (Recorded:13Apr2015) to Recorded 5. Wellbutrin XL 150 MG Oral Tablet Extended Release 24 Hour;  Therapy: (Recorded:13Apr2015) to Recorded 6. Xanax 0.5 MG Oral Tablet;  Therapy: (Recorded:13Apr2015) to Recorded  Allergies Medication  1. Neurontin CAPS  Family History Problems  1. Family history of cardiac disorder (V17.49) : Father, Paternal Grandmother 2. Family history of diabetes mellitus (V18.0) : Mother, Maternal Grandfather 3. Family history of kidney stones (V18.69) : Father  Social History Problems  1. Denied: History of Alcohol use 2. Caffeine use (V49.89)   1 a day 3. Divorced 4. Never smoker 5. Occupation   Ship broker at [employer] [Data Includes: Last 1 Day]  Recorded: [id number] 09:50AM  Blood Pressure: 127 / 79 Temperature: 97.4 F Heart Rate: 75  Physical Exam Abdomen: The abdomen is soft and nontender. No masses are palpated. No CVA tenderness. No hernias are palpable. No hepatosplenomegaly noted.  Genitourinary:  Chaperone Present: erica.  Examination of the external genitalia shows normal female external genitalia and  no lesions. The urethra is normal in appearance and not tender. There is no urethral mass. Vaginal exam demonstrates no abnormalities. The bladder is tender, but not distended and without masses. The anus is normal on inspection. The perineum is normal on inspection. The pelvic floor was  nontender.    Results/Data Urine [Data Includes: Last 1 Day]   [id number]  COLOR YELLOW   APPEARANCE CLEAR   SPECIFIC GRAVITY 1.020   pH 6.0   GLUCOSE NEG mg/dL  BILIRUBIN NEG   KETONE NEG mg/dL  BLOOD NEG   PROTEIN NEG mg/dL  UROBILINOGEN 0.2 mg/dL  NITRITE NEG   LEUKOCYTE ESTERASE NEG    Procedure  Procedure: Cystoscopy  Chaperone Present: erica.  Indication: Hematuria.  Informed Consent: Risks, benefits, and potential adverse events were discussed and informed consent was obtained from the patient.  Prep: The patient was prepped with betadine.  Antibiotic prophylaxis: Ciprofloxacin.  Procedure Note:  Urethral meatus:. No abnormalities.  Anterior urethra: No abnormalities.  Bladder: Visulization was clear. At the left bladder neck there was a small yellowish cystic appearing growth. The ureteral orifices were in the normal anatomic position bilaterally and had clear efflux of urine. A systematic survey of the bladder demonstrated no bladder tumors or stones. The mucosa was smooth without abnormalities. The patient tolerated the procedure well.  Complications: None.    Assessment Assessed  1. Microscopic hematuria (599.72) 2. Increased urinary frequency (788.41) 3. 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 Small cystic growth at left bladder neck.   Plan Bladder neoplasm  1. Follow-up Schedule Surgery Office  Follow-up  Status: Hold For - Appointment   Requested for: [id number] Health Maintenance  2. UA With REFLEX; [Do Not Release]; Status:Complete;   Done: [id number] 09:37AM  Discussion/Summary Microscopic hematuria-discussed the finding of the left bladder neck and recommended cystoscopy with biopsy possible TURBT in the OR. We went over the nature risks benefits and alternatives to biopsy.  Urgency and pelvic pain-could BE related to adenomyosis, could also be IC. She does not have a lot of frequency. While she is under anesthesia with  hydrodistention and its mainly diagnostic role in IC. We went over the nature risks benefits and alternatives to hydrodistention. All questions answered. She elects to proceed with biopsy and hydrodistention.      Signatures Electronically signed by : [name], M.D.; Feb 25 2014 10:19AM EST

## 2014-03-05 ENCOUNTER — Encounter (HOSPITAL_BASED_OUTPATIENT_CLINIC_OR_DEPARTMENT_OTHER): Payer: Self-pay | Admitting: *Deleted

## 2014-03-05 NOTE — Progress Notes (Signed)
NPO AFTER MN. ARRIVE AT 5397.  NEEDS HG. WILL TAKE LYRICA, WELLBUTRIN, SUCRALSATE, AND ZANAFLEX AM DOS W/ SIPS OF WATER.

## 2014-03-08 ENCOUNTER — Encounter (HOSPITAL_BASED_OUTPATIENT_CLINIC_OR_DEPARTMENT_OTHER): Payer: Medicaid Other | Admitting: Anesthesiology

## 2014-03-08 ENCOUNTER — Ambulatory Visit (HOSPITAL_BASED_OUTPATIENT_CLINIC_OR_DEPARTMENT_OTHER): Payer: Medicaid Other | Admitting: Anesthesiology

## 2014-03-08 ENCOUNTER — Ambulatory Visit (HOSPITAL_BASED_OUTPATIENT_CLINIC_OR_DEPARTMENT_OTHER)
Admission: RE | Admit: 2014-03-08 | Discharge: 2014-03-08 | Disposition: A | Discharge status: Home / Self Care | Payer: Medicaid Other | Source: Ambulatory Visit | Attending: Urology | Admitting: Urology

## 2014-03-08 ENCOUNTER — Encounter (HOSPITAL_BASED_OUTPATIENT_CLINIC_OR_DEPARTMENT_OTHER)
Admission: RE | Disposition: A | Discharge status: Home / Self Care | Payer: Self-pay | Source: Ambulatory Visit | Attending: Urology

## 2014-03-08 ENCOUNTER — Encounter (HOSPITAL_BASED_OUTPATIENT_CLINIC_OR_DEPARTMENT_OTHER): Payer: Self-pay | Admitting: *Deleted

## 2014-03-08 DIAGNOSIS — F411 Generalized anxiety disorder: Secondary | ICD-10-CM | POA: Insufficient documentation

## 2014-03-08 DIAGNOSIS — R3915 Urgency of urination: Secondary | ICD-10-CM | POA: Insufficient documentation

## 2014-03-08 DIAGNOSIS — N949 Unspecified condition associated with female genital organs and menstrual cycle: Secondary | ICD-10-CM | POA: Insufficient documentation

## 2014-03-08 DIAGNOSIS — D303 Benign neoplasm of bladder: Secondary | ICD-10-CM | POA: Insufficient documentation

## 2014-03-08 DIAGNOSIS — F329 Major depressive disorder, single episode, unspecified: Secondary | ICD-10-CM | POA: Insufficient documentation

## 2014-03-08 DIAGNOSIS — R350 Frequency of micturition: Secondary | ICD-10-CM | POA: Insufficient documentation

## 2014-03-08 DIAGNOSIS — F3289 Other specified depressive episodes: Secondary | ICD-10-CM | POA: Insufficient documentation

## 2014-03-08 DIAGNOSIS — R51 Headache: Secondary | ICD-10-CM | POA: Insufficient documentation

## 2014-03-08 DIAGNOSIS — R3129 Other microscopic hematuria: Secondary | ICD-10-CM | POA: Insufficient documentation

## 2014-03-08 DIAGNOSIS — K219 Gastro-esophageal reflux disease without esophagitis: Secondary | ICD-10-CM | POA: Insufficient documentation

## 2014-03-08 DIAGNOSIS — Z79899 Other long term (current) drug therapy: Secondary | ICD-10-CM | POA: Insufficient documentation

## 2014-03-08 DIAGNOSIS — N3289 Other specified disorders of bladder: Secondary | ICD-10-CM | POA: Insufficient documentation

## 2014-03-08 HISTORY — DX: Spondylosis, unspecified: M47.9

## 2014-03-08 HISTORY — DX: Neoplasm of unspecified behavior of bladder: D49.4

## 2014-03-08 HISTORY — DX: Personal history of other diseases of the digestive system: Z87.19

## 2014-03-08 HISTORY — DX: Depression, unspecified: F32.A

## 2014-03-08 HISTORY — DX: Other specified mononeuropathies: G58.8

## 2014-03-08 HISTORY — DX: Abnormal uterine and vaginal bleeding, unspecified: N93.9

## 2014-03-08 HISTORY — DX: Restless legs syndrome: G25.81

## 2014-03-08 HISTORY — DX: Major depressive disorder, single episode, unspecified: F32.9

## 2014-03-08 HISTORY — DX: Pelvic and perineal pain: R10.2

## 2014-03-08 HISTORY — DX: Nocturia: R35.1

## 2014-03-08 HISTORY — PX: CYSTOSCOPY WITH BIOPSY: SHX5122

## 2014-03-08 HISTORY — DX: Urgency of urination: R39.15

## 2014-03-08 LAB — POCT HEMOGLOBIN-HEMACUE: HEMOGLOBIN: 12.3 g/dL (ref 12.0–15.0)

## 2014-03-08 SURGERY — CYSTOSCOPY, WITH BIOPSY
Anesthesia: General | Site: Bladder

## 2014-03-08 MED ORDER — OXYCODONE-ACETAMINOPHEN 5-325 MG PO TABS
1.0000 | ORAL_TABLET | Freq: Once | ORAL | Status: AC
  Administered 2014-03-08: 1 via ORAL
  Filled 2014-03-08: qty 1

## 2014-03-08 MED ORDER — MIDAZOLAM HCL 2 MG/2ML IJ SOLN
INTRAMUSCULAR | Status: AC
  Filled 2014-03-08: qty 2

## 2014-03-08 MED ORDER — PHENAZOPYRIDINE HCL 100 MG PO TABS
ORAL_TABLET | ORAL | Status: AC
  Filled 2014-03-08: qty 2

## 2014-03-08 MED ORDER — PHENAZOPYRIDINE HCL 200 MG PO TABS
200.0000 mg | ORAL_TABLET | Freq: Three times a day (TID) | ORAL | Status: AC
  Administered 2014-03-08: 200 mg via ORAL
  Filled 2014-03-08: qty 1

## 2014-03-08 MED ORDER — PHENAZOPYRIDINE HCL 200 MG PO TABS: 200.0000 mg | ORAL_TABLET | Freq: Three times a day (TID) | ORAL | Status: DC | PRN

## 2014-03-08 MED ORDER — MIDAZOLAM HCL 5 MG/5ML IJ SOLN
INTRAMUSCULAR | Status: DC | PRN
  Administered 2014-03-08: 2 mg via INTRAVENOUS

## 2014-03-08 MED ORDER — PROPOFOL 10 MG/ML IV BOLUS
INTRAVENOUS | Status: DC | PRN
  Administered 2014-03-08: 160 mg via INTRAVENOUS

## 2014-03-08 MED ORDER — PROMETHAZINE HCL 25 MG/ML IJ SOLN
6.2500 mg | INTRAMUSCULAR | Status: DC | PRN
  Filled 2014-03-08: qty 1

## 2014-03-08 MED ORDER — FENTANYL CITRATE 0.05 MG/ML IJ SOLN
INTRAMUSCULAR | Status: DC | PRN
  Administered 2014-03-08: 50 ug via INTRAVENOUS

## 2014-03-08 MED ORDER — ONDANSETRON HCL 4 MG/2ML IJ SOLN
INTRAMUSCULAR | Status: DC | PRN
  Administered 2014-03-08: 4 mg via INTRAVENOUS

## 2014-03-08 MED ORDER — OXYCODONE-ACETAMINOPHEN 5-325 MG PO TABS
ORAL_TABLET | ORAL | Status: AC
  Filled 2014-03-08: qty 1

## 2014-03-08 MED ORDER — LIDOCAINE HCL (CARDIAC) 20 MG/ML IV SOLN
INTRAVENOUS | Status: DC | PRN
  Administered 2014-03-08: 80 mg via INTRAVENOUS

## 2014-03-08 MED ORDER — CEFAZOLIN SODIUM 1-5 GM-% IV SOLN
1.0000 g | INTRAVENOUS | Status: DC
  Filled 2014-03-08: qty 50

## 2014-03-08 MED ORDER — LACTATED RINGERS IV SOLN
INTRAVENOUS | Status: DC
  Administered 2014-03-08: 09:00:00 via INTRAVENOUS
  Filled 2014-03-08: qty 1000

## 2014-03-08 MED ORDER — LACTATED RINGERS IV SOLN
INTRAVENOUS | Status: DC
  Filled 2014-03-08: qty 1000

## 2014-03-08 MED ORDER — PHENAZOPYRIDINE HCL 200 MG PO TABS
ORAL | Status: DC | PRN
  Administered 2014-03-08: 09:00:00 via INTRAVESICAL

## 2014-03-08 MED ORDER — HYDROMORPHONE HCL PF 1 MG/ML IJ SOLN
0.2500 mg | INTRAMUSCULAR | Status: DC | PRN
  Filled 2014-03-08: qty 1

## 2014-03-08 MED ORDER — KETOROLAC TROMETHAMINE 30 MG/ML IJ SOLN
INTRAMUSCULAR | Status: DC | PRN
  Administered 2014-03-08: 30 mg via INTRAVENOUS

## 2014-03-08 MED ORDER — CIPROFLOXACIN HCL 500 MG PO TABS: 500.0000 mg | ORAL_TABLET | Freq: Two times a day (BID) | ORAL | Status: DC

## 2014-03-08 MED ORDER — SODIUM CHLORIDE 0.9 % IR SOLN
Status: DC | PRN
  Administered 2014-03-08: 6000 mL

## 2014-03-08 MED ORDER — DEXAMETHASONE SODIUM PHOSPHATE 4 MG/ML IJ SOLN
INTRAMUSCULAR | Status: DC | PRN
  Administered 2014-03-08: 10 mg via INTRAVENOUS

## 2014-03-08 MED ORDER — FENTANYL CITRATE 0.05 MG/ML IJ SOLN
INTRAMUSCULAR | Status: AC
  Filled 2014-03-08: qty 2

## 2014-03-08 MED ORDER — CEFAZOLIN SODIUM-DEXTROSE 2-3 GM-% IV SOLR
2.0000 g | INTRAVENOUS | Status: AC
  Administered 2014-03-08: 2 g via INTRAVENOUS
  Filled 2014-03-08: qty 50

## 2014-03-08 SURGICAL SUPPLY — 19 items
BAG DRAIN URO-CYSTO SKYTR STRL (DRAIN) ×2 IMPLANT
CANISTER SUCT LVC 12 LTR MEDI- (MISCELLANEOUS) ×2 IMPLANT
CATH ROBINSON RED A/P 16FR (CATHETERS) ×2 IMPLANT
CLOTH BEACON ORANGE TIMEOUT ST (SAFETY) ×2 IMPLANT
DRAPE CAMERA CLOSED 9X96 (DRAPES) ×2 IMPLANT
ELECT LOOP MED HF 24F 12D CBL (CLIP) ×2 IMPLANT
ELECT REM PT RETURN 9FT ADLT (ELECTROSURGICAL) ×2
ELECTRODE REM PT RTRN 9FT ADLT (ELECTROSURGICAL) ×1 IMPLANT
GLOVE BIO SURGEON STRL SZ7.5 (GLOVE) ×2 IMPLANT
GLOVE BIOGEL M STER SZ 6 (GLOVE) ×2 IMPLANT
GLOVE BIOGEL PI IND STRL 6.5 (GLOVE) ×2 IMPLANT
GLOVE BIOGEL PI INDICATOR 6.5 (GLOVE) ×2
GOWN STRL REIN XL XLG (GOWN DISPOSABLE) ×2 IMPLANT
GOWN STRL REUS W/TWL LRG LVL3 (GOWN DISPOSABLE) ×2 IMPLANT
GOWN STRL REUS W/TWL XL LVL3 (GOWN DISPOSABLE) ×2 IMPLANT
NEEDLE HYPO 22GX1.5 SAFETY (NEEDLE) IMPLANT
NS IRRIG 500ML POUR BTL (IV SOLUTION) ×2 IMPLANT
PACK CYSTOSCOPY (CUSTOM PROCEDURE TRAY) ×2 IMPLANT
WATER STERILE IRR 3000ML UROMA (IV SOLUTION) ×2 IMPLANT

## 2014-03-08 NOTE — Anesthesia Procedure Notes (Signed)
Procedure Name: LMA Insertion Date/Time: 03/08/2014 9:07 AM Performed by: Bethena Roys T Pre-anesthesia Checklist: Patient identified, Emergency Drugs available, Suction available and Patient being monitored Patient Re-evaluated:Patient Re-evaluated prior to inductionOxygen Delivery Method: Circle System Utilized Preoxygenation: Pre-oxygenation with 100% oxygen Intubation Type: IV induction Ventilation: Mask ventilation without difficulty LMA: LMA inserted LMA Size: 4.0 Number of attempts: 1 Airway Equipment and Method: bite block Placement Confirmation: positive ETCO2 Dental Injury: Teeth and Oropharynx as per pre-operative assessment

## 2014-03-08 NOTE — Transfer of Care (Signed)
Immediate Anesthesia Transfer of Care Note  Patient: Kayla Mendoza  Procedure(s) Performed: Procedure(s): CYSTOSCOPY WITH  TURBT SMALL HYDROTENSION OF BLADDER INSTALLATION OF MARCAINE AND  PYRIDIUM (N/A)  Patient Location: PACU  Anesthesia Type:General  Level of Consciousness: awake, alert  and oriented  Airway & Oxygen Therapy: Patient Spontanous Breathing  Post-op Assessment: Report given to PACU RN  Post vital signs: Reviewed and stable  Complications: No apparent anesthesia complications

## 2014-03-08 NOTE — Interval H&P Note (Signed)
History and Physical Interval Note:  03/08/2014 8:53 AM  Kayla Mendoza  has presented today for surgery, with the diagnosis of Landfall  The various methods of treatment have been discussed with the patient and family. After consideration of risks, benefits and other options for treatment, the patient has consented to  Procedure(s): CYSTOSCOPY WITH BLADDER  BIOPSY POSSIBLE TURBT  HYDROTENSION OF BLADDER INSTALLATION OF MARCAINE AND  PYRIDIUM (N/A) as a surgical intervention .  The patient's history has been reviewed, patient examined, no change in status, stable for surgery.  I have reviewed the patient's chart and labs.  Questions were answered to the patient's satisfaction.  No dysuria or fever. She has some frequency, urgency. She has pelvic discomfort with urgency, but also close to incontinence. She is scheduled for hysterectomy July 1. She may have IC, possible OAB. Also, small cystic structure in bladder which we will biopsy.    [name]

## 2014-03-08 NOTE — Op Note (Signed)
Preoperative diagnosis: Urinary urgency, pelvic pain, bladder neoplasm Postoperative diagnosis: Same  Procedure: Exam under anesthesia Cystoscopy with hydrodistention TURBT small Installation of Marcaine, Pyridium  Surgeon: Junious Silk  Anesthesia: Gen.  Findings: On exam under anesthesia there were no palpable abdominal, bladder or urethral masses. On cystoscopy there was a cystic-like structure in the mid urethra and a larger one of the left bladder neck containing a yellowish substance. Bladder capacity 850 mL after hydrodistention. There were no ulcers in the bladder. There were no glomerulations after hydrodistention. The bladder appeared normal apart from some mild trabeculation. The mucosa appeared normal. The trigone and ureteral orifices were in their normal orthotopic position and there was good clear efflux bilaterally.  Description of procedure: After consent was obtained patient brought to the operating room. After adequate anesthesia she was placed in lithotomy position. An exam under anesthesia was performed. She was prepped and draped in the usual sterile fashion. A timeout was performed to confirm the patient and procedure. Cystoscope was passed per urethra and the bladder carefully examined with a 12 and 70 lens.  The bladder was then hydrodistended and 75 cm water pressure to capacity and then drained. Bladder capacity 850 mL. The bladder was filled again to capacity and drained immediately. There were no changes to the bladder mucosa and  no glomerulations.  The cystoscope was removed and the resectoscope with the loop was placed. The lesion in the left bladder neck was resected with one swipe. The mucosa was lightly fulgurated. The stricture appeared cystic in the yellow substance drained out with manipulation. There was another such lesion in the mid urethra and just passing the scope was caused the yellowish material to extrude.  Hemostasis was excellent and the scope  removed. A 16 French regular catheter was inserted in the bladder and Marcaine, Pyridium mixture instilled. The catheter was removed. The patient was awakened taken to recovery in stable condition.  Blood loss was minimal and there were no complications. No drains.  Specimen: Left bladder neck lesion to pathology.

## 2014-03-08 NOTE — Anesthesia Preprocedure Evaluation (Addendum)
Anesthesia Evaluation  Patient identified by MRN, date of birth, ID band Patient awake    Reviewed: Allergy & Precautions, H&P , NPO status , Patient's Chart, lab work & pertinent test results  Airway Mallampati: II TM Distance: >3 FB Neck ROM: Full    Dental  (+) Teeth Intact, Dental Advisory Given   Pulmonary neg pulmonary ROS,  breath sounds clear to auscultation  Pulmonary exam normal       Cardiovascular negative cardio ROS  Rhythm:Regular Rate:Normal     Neuro/Psych  Headaches, Anxiety Depression    GI/Hepatic Neg liver ROS, GERD-  Medicated,  Endo/Other  negative endocrine ROS  Renal/GU negative Renal ROS   Neoplasm bladder negative genitourinary   Musculoskeletal negative musculoskeletal ROS (+)   Abdominal   Peds negative pediatric ROS (+)  Hematology negative hematology ROS (+)   Anesthesia Other Findings Braces  Reproductive/Obstetrics                          Anesthesia Physical Anesthesia Plan  ASA: I  Anesthesia Plan: General   Post-op Pain Management:    Induction: Intravenous  Airway Management Planned: LMA  Additional Equipment:   Intra-op Plan:   Post-operative Plan: Extubation in OR  Informed Consent: I have reviewed the patients History and Physical, chart, labs and discussed the procedure including the risks, benefits and alternatives for the proposed anesthesia with the patient or authorized representative who has indicated his/her understanding and acceptance.   Dental advisory given  Plan Discussed with: CRNA  Anesthesia Plan Comments:        Anesthesia Quick Evaluation

## 2014-03-08 NOTE — Discharge Instructions (Signed)
Cystoscopy, Care After °Refer to this sheet in the next few weeks. These instructions provide you with information on caring for yourself after your procedure. Your caregiver may also give you more specific instructions. Your treatment has been planned according to current medical practices, but problems sometimes occur. Call your caregiver if you have any problems or questions after your procedure. °HOME CARE INSTRUCTIONS  °Things you can do to ease any discomfort after your procedure include: °· Drinking enough water and fluids to keep your urine clear or pale yellow. °· Taking a warm bath to relieve any burning feelings. °SEEK IMMEDIATE MEDICAL CARE IF:  °· You have an increase in blood in your urine. °· You notice blood clots in your urine. °· You have difficulty passing urine. °· You have the chills. °· You have abdominal pain. °· You have a fever or persistent symptoms for more than 2 3 days. °· You have a fever and your symptoms suddenly get worse. °MAKE SURE YOU:  °· Understand these instructions. °· Will watch your condition. °· Will get help right away if you are not doing well or get worse. °Document Released: 04/23/2005 Document Revised: 06/06/2013 Document Reviewed: 03/27/2012 °Exit[employer]® Patient Information ©2014 [employer]. ° °Post Anesthesia Home Care Instructions ° °Activity: °Get plenty of rest for the remainder of the day. A responsible adult should stay with you for 24 hours following the procedure.  °For the next 24 hours, DO NOT: °-Drive a car °-Operate machinery °-Drink alcoholic beverages °-Take any medication unless instructed by your physician °-Make any legal decisions or sign important papers. ° °Meals: °Start with liquid foods such as gelatin or soup. Progress to regular foods as tolerated. Avoid greasy, spicy, heavy foods. If nausea and/or vomiting occur, drink only clear liquids until the nausea and/or vomiting subsides. Call your physician if vomiting continues. ° °Special  Instructions/Symptoms: °Your throat may feel dry or sore from the anesthesia or the breathing tube placed in your throat during surgery. If this causes discomfort, gargle with warm salt water. The discomfort should disappear within 24 hours. ° °

## 2014-03-09 NOTE — Anesthesia Postprocedure Evaluation (Signed)
Anesthesia Post Note  Patient: Kayla Mendoza  Procedure(s) Performed: Procedure(s) (LRB): CYSTOSCOPY WITH  TURBT SMALL HYDROTENSION OF BLADDER INSTALLATION OF MARCAINE AND  PYRIDIUM (N/A)  Anesthesia type: General  Patient location: PACU  Post pain: Pain level controlled  Post assessment: Post-op Vital signs reviewed  Last Vitals:  Filed Vitals:   03/08/14 1140  BP: 105/75  Pulse: 58  Temp: 36.1 C  Resp: 16    Post vital signs: Reviewed  Level of consciousness: sedated  Complications: No apparent anesthesia complications

## 2014-03-12 ENCOUNTER — Encounter (HOSPITAL_BASED_OUTPATIENT_CLINIC_OR_DEPARTMENT_OTHER): Payer: Self-pay | Admitting: Urology

## 2014-04-08 ENCOUNTER — Encounter (HOSPITAL_COMMUNITY): Payer: Self-pay | Admitting: Pharmacist

## 2014-04-16 ENCOUNTER — Encounter (HOSPITAL_COMMUNITY): Payer: Self-pay

## 2014-04-16 ENCOUNTER — Encounter (HOSPITAL_COMMUNITY)
Admission: RE | Admit: 2014-04-16 | Discharge: 2014-04-16 | Disposition: A | Discharge status: Home / Self Care | Payer: Medicaid Other | Source: Ambulatory Visit | Attending: Obstetrics and Gynecology | Admitting: Obstetrics and Gynecology

## 2014-04-16 DIAGNOSIS — N938 Other specified abnormal uterine and vaginal bleeding: Secondary | ICD-10-CM | POA: Diagnosis not present

## 2014-04-16 DIAGNOSIS — G2581 Restless legs syndrome: Secondary | ICD-10-CM | POA: Diagnosis not present

## 2014-04-16 DIAGNOSIS — N949 Unspecified condition associated with female genital organs and menstrual cycle: Secondary | ICD-10-CM | POA: Diagnosis not present

## 2014-04-16 DIAGNOSIS — N838 Other noninflammatory disorders of ovary, fallopian tube and broad ligament: Secondary | ICD-10-CM | POA: Diagnosis not present

## 2014-04-16 DIAGNOSIS — Z8249 Family history of ischemic heart disease and other diseases of the circulatory system: Secondary | ICD-10-CM | POA: Diagnosis not present

## 2014-04-16 DIAGNOSIS — N925 Other specified irregular menstruation: Secondary | ICD-10-CM | POA: Diagnosis not present

## 2014-04-16 DIAGNOSIS — K219 Gastro-esophageal reflux disease without esophagitis: Secondary | ICD-10-CM | POA: Diagnosis not present

## 2014-04-16 DIAGNOSIS — Z833 Family history of diabetes mellitus: Secondary | ICD-10-CM | POA: Diagnosis not present

## 2014-04-16 LAB — COMPREHENSIVE METABOLIC PANEL
ALT: 10 U/L (ref 0–35)
AST: 14 U/L (ref 0–37)
Albumin: 3.3 g/dL — ABNORMAL LOW (ref 3.5–5.2)
Alkaline Phosphatase: 69 U/L (ref 39–117)
BUN: 7 mg/dL (ref 6–23)
CO2: 31 meq/L (ref 19–32)
CREATININE: 0.85 mg/dL (ref 0.50–1.10)
Calcium: 9.1 mg/dL (ref 8.4–10.5)
Chloride: 102 mEq/L (ref 96–112)
GFR, EST NON AFRICAN AMERICAN: 86 mL/min — AB (ref >90)
Glucose, Bld: 108 mg/dL — ABNORMAL HIGH (ref 70–99)
Potassium: 3.6 mEq/L — ABNORMAL LOW (ref 3.7–5.3)
Sodium: 142 mEq/L (ref 137–147)
Total Bilirubin: 0.4 mg/dL (ref 0.3–1.2)
Total Protein: 6.8 g/dL (ref 6.0–8.3)

## 2014-04-16 LAB — SURGICAL PCR SCREEN
MRSA, PCR: NEGATIVE
Staphylococcus aureus: NEGATIVE

## 2014-04-16 LAB — CBC
HCT: 36.8 % (ref 36.0–46.0)
HEMOGLOBIN: 11.9 g/dL — AB (ref 12.0–15.0)
MCH: 28.8 pg (ref 26.0–34.0)
MCHC: 32.3 g/dL (ref 30.0–36.0)
MCV: 89.1 fL (ref 78.0–100.0)
Platelets: 235 10*3/uL (ref 150–400)
RBC: 4.13 MIL/uL (ref 3.87–5.11)
RDW: 13.5 % (ref 11.5–15.5)
WBC: 9.2 10*3/uL (ref 4.0–10.5)

## 2014-04-16 NOTE — Patient Instructions (Signed)
20 Kayla Mendoza  04/16/2014   Your procedure is scheduled on:  04/17/14  Enter through the Main Entrance of [facility] at [place] up the phone at the desk and dial [phone].   Call this number if you have problems the morning of surgery: [phone]   Remember:   Do not eat food:After Midnight.  Do not drink clear liquids: After Midnight.  Take these medicines the morning of surgery with A SIP OF WATER: Carafate   Do not wear jewelry, make-up or nail polish.  Do not wear lotions, powders, or perfumes. You may wear deodorant.  Do not shave 48 hours prior to surgery.  Do not bring valuables to the hospital.  [facility] is not   responsible for any belongings or valuables brought to the hospital.  Contacts, dentures or bridgework may not be worn into surgery.  Leave suitcase in the car. After surgery it may be brought to your room.  For patients admitted to the hospital, checkout time is 11:00 AM the day of              discharge.   Patients discharged the day of surgery will not be allowed to drive             home.  Name and phone number of your driver: NA  Special Instructions:      Please read over the following fact sheets that you were given:   Surgical Site Infection Prevention

## 2014-04-17 ENCOUNTER — Ambulatory Visit (HOSPITAL_COMMUNITY)
Admission: RE | Admit: 2014-04-17 | Discharge: 2014-04-18 | Disposition: A | Discharge status: Home / Self Care | Payer: Medicaid Other | Source: Ambulatory Visit | Attending: Obstetrics and Gynecology | Admitting: Obstetrics and Gynecology

## 2014-04-17 ENCOUNTER — Encounter (HOSPITAL_COMMUNITY): Payer: Medicaid Other | Admitting: Anesthesiology

## 2014-04-17 ENCOUNTER — Ambulatory Visit (HOSPITAL_COMMUNITY): Payer: Medicaid Other | Admitting: Anesthesiology

## 2014-04-17 ENCOUNTER — Encounter (HOSPITAL_COMMUNITY): Payer: Self-pay | Admitting: *Deleted

## 2014-04-17 ENCOUNTER — Encounter (HOSPITAL_COMMUNITY)
Admission: RE | Disposition: A | Discharge status: Home / Self Care | Payer: Self-pay | Source: Ambulatory Visit | Attending: Obstetrics and Gynecology

## 2014-04-17 DIAGNOSIS — G2581 Restless legs syndrome: Secondary | ICD-10-CM | POA: Insufficient documentation

## 2014-04-17 DIAGNOSIS — K219 Gastro-esophageal reflux disease without esophagitis: Secondary | ICD-10-CM | POA: Diagnosis not present

## 2014-04-17 DIAGNOSIS — N938 Other specified abnormal uterine and vaginal bleeding: Secondary | ICD-10-CM | POA: Insufficient documentation

## 2014-04-17 DIAGNOSIS — N925 Other specified irregular menstruation: Secondary | ICD-10-CM | POA: Diagnosis not present

## 2014-04-17 DIAGNOSIS — R102 Pelvic and perineal pain: Secondary | ICD-10-CM | POA: Diagnosis present

## 2014-04-17 DIAGNOSIS — N949 Unspecified condition associated with female genital organs and menstrual cycle: Secondary | ICD-10-CM | POA: Insufficient documentation

## 2014-04-17 DIAGNOSIS — Z8249 Family history of ischemic heart disease and other diseases of the circulatory system: Secondary | ICD-10-CM | POA: Insufficient documentation

## 2014-04-17 DIAGNOSIS — N838 Other noninflammatory disorders of ovary, fallopian tube and broad ligament: Secondary | ICD-10-CM | POA: Insufficient documentation

## 2014-04-17 DIAGNOSIS — Z833 Family history of diabetes mellitus: Secondary | ICD-10-CM | POA: Insufficient documentation

## 2014-04-17 HISTORY — PX: CYSTOSCOPY: SHX5120

## 2014-04-17 HISTORY — PX: LAPAROSCOPIC ASSISTED VAGINAL HYSTERECTOMY: SHX5398

## 2014-04-17 LAB — PREGNANCY, URINE: Preg Test, Ur: NEGATIVE

## 2014-04-17 SURGERY — HYSTERECTOMY, VAGINAL, LAPAROSCOPY-ASSISTED
Anesthesia: General | Site: Urethra

## 2014-04-17 MED ORDER — NALOXONE HCL 0.4 MG/ML IJ SOLN: 0.4000 mg | INTRAMUSCULAR | Status: DC | PRN

## 2014-04-17 MED ORDER — NEOSTIGMINE METHYLSULFATE 10 MG/10ML IV SOLN
INTRAVENOUS | Status: AC
  Filled 2014-04-17: qty 1

## 2014-04-17 MED ORDER — LACTATED RINGERS IV SOLN
INTRAVENOUS | Status: DC
  Administered 2014-04-17 (×3): via INTRAVENOUS

## 2014-04-17 MED ORDER — HEPARIN SODIUM (PORCINE) 5000 UNIT/ML IJ SOLN
INTRAMUSCULAR | Status: AC
  Filled 2014-04-17: qty 1

## 2014-04-17 MED ORDER — VALACYCLOVIR HCL 500 MG PO TABS
1000.0000 mg | ORAL_TABLET | Freq: Every day | ORAL | Status: DC
  Administered 2014-04-17 – 2014-04-18 (×2): 1000 mg via ORAL
  Filled 2014-04-17 (×3): qty 2

## 2014-04-17 MED ORDER — GLYCOPYRROLATE 0.2 MG/ML IJ SOLN
INTRAMUSCULAR | Status: DC | PRN
  Administered 2014-04-17: 0.2 mg via INTRAVENOUS
  Administered 2014-04-17: 0.4 mg via INTRAVENOUS

## 2014-04-17 MED ORDER — FENTANYL CITRATE 0.05 MG/ML IJ SOLN
INTRAMUSCULAR | Status: AC
  Filled 2014-04-17: qty 5

## 2014-04-17 MED ORDER — HYDROMORPHONE HCL PF 1 MG/ML IJ SOLN
0.2500 mg | INTRAMUSCULAR | Status: DC | PRN
  Administered 2014-04-17: 0.25 mg via INTRAVENOUS
  Administered 2014-04-17: 0.5 mg via INTRAVENOUS
  Administered 2014-04-17: 0.25 mg via INTRAVENOUS

## 2014-04-17 MED ORDER — MEPERIDINE HCL 25 MG/ML IJ SOLN: 6.2500 mg | INTRAMUSCULAR | Status: DC | PRN

## 2014-04-17 MED ORDER — ESCITALOPRAM OXALATE 20 MG PO TABS
20.0000 mg | ORAL_TABLET | Freq: Every day | ORAL | Status: DC
  Administered 2014-04-17 – 2014-04-18 (×2): 20 mg via ORAL
  Filled 2014-04-17 (×3): qty 1

## 2014-04-17 MED ORDER — DEXAMETHASONE SODIUM PHOSPHATE 10 MG/ML IJ SOLN
INTRAMUSCULAR | Status: AC
  Filled 2014-04-17: qty 1

## 2014-04-17 MED ORDER — FENTANYL CITRATE 0.05 MG/ML IJ SOLN
INTRAMUSCULAR | Status: DC | PRN
  Administered 2014-04-17 (×2): 25 ug via INTRAVENOUS
  Administered 2014-04-17: 50 ug via INTRAVENOUS
  Administered 2014-04-17 (×2): 100 ug via INTRAVENOUS
  Administered 2014-04-17: 50 ug via INTRAVENOUS
  Administered 2014-04-17: 100 ug via INTRAVENOUS

## 2014-04-17 MED ORDER — ONDANSETRON HCL 4 MG/2ML IJ SOLN
INTRAMUSCULAR | Status: DC | PRN
  Administered 2014-04-17: 4 mg via INTRAVENOUS

## 2014-04-17 MED ORDER — ONDANSETRON HCL 4 MG/2ML IJ SOLN
INTRAMUSCULAR | Status: AC
  Filled 2014-04-17: qty 2

## 2014-04-17 MED ORDER — GLYCOPYRROLATE 0.2 MG/ML IJ SOLN
INTRAMUSCULAR | Status: AC
  Filled 2014-04-17: qty 3

## 2014-04-17 MED ORDER — SODIUM CHLORIDE 0.9 % IV SOLN
INTRAVENOUS | Status: DC | PRN
  Administered 2014-04-17: 09:00:00 via INTRAMUSCULAR

## 2014-04-17 MED ORDER — PROPOFOL 10 MG/ML IV EMUL
INTRAVENOUS | Status: AC
  Filled 2014-04-17: qty 20

## 2014-04-17 MED ORDER — SUCRALFATE 1 G PO TABS
1.0000 g | ORAL_TABLET | Freq: Three times a day (TID) | ORAL | Status: DC
  Administered 2014-04-17 – 2014-04-18 (×3): 1 g via ORAL
  Filled 2014-04-17 (×6): qty 1

## 2014-04-17 MED ORDER — SCOPOLAMINE 1 MG/3DAYS TD PT72
1.0000 | MEDICATED_PATCH | Freq: Once | TRANSDERMAL | Status: DC
Start: 2014-04-17 — End: 2014-04-18
  Filled 2014-04-17: qty 1

## 2014-04-17 MED ORDER — SODIUM CHLORIDE 0.9 % IJ SOLN
9.0000 mL | INTRAMUSCULAR | Status: DC | PRN
Start: 2014-04-17 — End: 2014-04-17

## 2014-04-17 MED ORDER — HYDROMORPHONE 0.3 MG/ML IV SOLN: INTRAVENOUS | Status: DC

## 2014-04-17 MED ORDER — PROMETHAZINE HCL 25 MG/ML IJ SOLN: 12.5000 mg | Freq: Once | INTRAMUSCULAR | Status: DC | PRN

## 2014-04-17 MED ORDER — PROPOFOL 10 MG/ML IV BOLUS
INTRAVENOUS | Status: DC | PRN
  Administered 2014-04-17: 180 mg via INTRAVENOUS
  Administered 2014-04-17: 20 mg via INTRAVENOUS

## 2014-04-17 MED ORDER — KETOROLAC TROMETHAMINE 30 MG/ML IJ SOLN: 30.0000 mg | Freq: Once | INTRAMUSCULAR | Status: DC

## 2014-04-17 MED ORDER — CEFAZOLIN SODIUM-DEXTROSE 2-3 GM-% IV SOLR
INTRAVENOUS | Status: AC
  Filled 2014-04-17: qty 50

## 2014-04-17 MED ORDER — METOCLOPRAMIDE HCL 5 MG/ML IJ SOLN: 10.0000 mg | Freq: Three times a day (TID) | INTRAMUSCULAR | Status: DC | PRN

## 2014-04-17 MED ORDER — DIPHENHYDRAMINE HCL 12.5 MG/5ML PO ELIX: 12.5000 mg | ORAL_SOLUTION | Freq: Four times a day (QID) | ORAL | Status: DC | PRN

## 2014-04-17 MED ORDER — SODIUM CHLORIDE 0.9 % IJ SOLN
INTRAMUSCULAR | Status: AC
  Filled 2014-04-17: qty 50

## 2014-04-17 MED ORDER — MIDAZOLAM HCL 2 MG/2ML IJ SOLN
INTRAMUSCULAR | Status: AC
  Filled 2014-04-17: qty 2

## 2014-04-17 MED ORDER — HYDROMORPHONE 0.3 MG/ML IV SOLN
INTRAVENOUS | Status: DC
  Administered 2014-04-17: 13:00:00 via INTRAVENOUS
  Administered 2014-04-17: 1.19 mg via INTRAVENOUS
  Filled 2014-04-17: qty 25

## 2014-04-17 MED ORDER — DIPHENHYDRAMINE HCL 50 MG/ML IJ SOLN: 12.5000 mg | Freq: Four times a day (QID) | INTRAMUSCULAR | Status: DC | PRN

## 2014-04-17 MED ORDER — ONDANSETRON HCL 4 MG/2ML IJ SOLN
4.0000 mg | Freq: Three times a day (TID) | INTRAMUSCULAR | Status: DC | PRN
  Administered 2014-04-17 (×2): 4 mg via INTRAVENOUS
  Filled 2014-04-17: qty 2

## 2014-04-17 MED ORDER — ALUM & MAG HYDROXIDE-SIMETH 200-200-20 MG/5ML PO SUSP: 30.0000 mL | ORAL | Status: DC | PRN

## 2014-04-17 MED ORDER — VASOPRESSIN 20 UNIT/ML IJ SOLN
INTRAMUSCULAR | Status: AC
  Filled 2014-04-17: qty 1

## 2014-04-17 MED ORDER — ONDANSETRON HCL 4 MG/2ML IJ SOLN: 4.0000 mg | Freq: Four times a day (QID) | INTRAMUSCULAR | Status: DC | PRN

## 2014-04-17 MED ORDER — KETOROLAC TROMETHAMINE 30 MG/ML IJ SOLN: 30.0000 mg | Freq: Four times a day (QID) | INTRAMUSCULAR | Status: AC | PRN

## 2014-04-17 MED ORDER — BUPIVACAINE HCL (PF) 0.25 % IJ SOLN
INTRAMUSCULAR | Status: AC
  Filled 2014-04-17: qty 30

## 2014-04-17 MED ORDER — DIPHENHYDRAMINE HCL 25 MG PO CAPS
25.0000 mg | ORAL_CAPSULE | ORAL | Status: DC | PRN
  Administered 2014-04-18: 25 mg via ORAL
  Filled 2014-04-17: qty 1

## 2014-04-17 MED ORDER — SODIUM CHLORIDE 0.9 % IJ SOLN: 3.0000 mL | INTRAMUSCULAR | Status: DC | PRN

## 2014-04-17 MED ORDER — HYDROMORPHONE 0.3 MG/ML IV SOLN
INTRAVENOUS | Status: DC
  Administered 2014-04-17: 0.2 mg via INTRAVENOUS
  Administered 2014-04-18: 0.599 mg via INTRAVENOUS
  Administered 2014-04-18: 0.4 mg via INTRAVENOUS

## 2014-04-17 MED ORDER — KETOROLAC TROMETHAMINE 30 MG/ML IJ SOLN: 15.0000 mg | Freq: Once | INTRAMUSCULAR | Status: DC | PRN

## 2014-04-17 MED ORDER — IBUPROFEN 600 MG PO TABS: 600.0000 mg | ORAL_TABLET | Freq: Four times a day (QID) | ORAL | Status: DC | PRN

## 2014-04-17 MED ORDER — SODIUM CHLORIDE 0.9 % IJ SOLN
INTRAMUSCULAR | Status: AC
  Filled 2014-04-17: qty 10

## 2014-04-17 MED ORDER — KETOROLAC TROMETHAMINE 30 MG/ML IJ SOLN
INTRAMUSCULAR | Status: DC | PRN
  Administered 2014-04-17: 30 mg via INTRAVENOUS

## 2014-04-17 MED ORDER — BUPROPION HCL ER (SR) 150 MG PO TB12
150.0000 mg | ORAL_TABLET | Freq: Two times a day (BID) | ORAL | Status: DC
  Administered 2014-04-17 – 2014-04-18 (×3): 150 mg via ORAL
  Filled 2014-04-17 (×5): qty 1

## 2014-04-17 MED ORDER — DEXAMETHASONE SODIUM PHOSPHATE 10 MG/ML IJ SOLN
INTRAMUSCULAR | Status: DC | PRN
  Administered 2014-04-17: 10 mg via INTRAVENOUS

## 2014-04-17 MED ORDER — DIPHENHYDRAMINE HCL 50 MG/ML IJ SOLN: 12.5000 mg | INTRAMUSCULAR | Status: DC | PRN

## 2014-04-17 MED ORDER — BUPIVACAINE HCL (PF) 0.25 % IJ SOLN
INTRAMUSCULAR | Status: DC | PRN
  Administered 2014-04-17: 7 mL

## 2014-04-17 MED ORDER — MENTHOL 3 MG MT LOZG: 1.0000 | LOZENGE | OROMUCOSAL | Status: DC | PRN

## 2014-04-17 MED ORDER — ROCURONIUM BROMIDE 100 MG/10ML IV SOLN
INTRAVENOUS | Status: AC
  Filled 2014-04-17: qty 1

## 2014-04-17 MED ORDER — HYDROMORPHONE HCL PF 1 MG/ML IJ SOLN
INTRAMUSCULAR | Status: AC
  Administered 2014-04-17: 0.25 mg via INTRAVENOUS
  Filled 2014-04-17: qty 1

## 2014-04-17 MED ORDER — MIDAZOLAM HCL 2 MG/2ML IJ SOLN: 0.5000 mg | Freq: Once | INTRAMUSCULAR | Status: DC | PRN

## 2014-04-17 MED ORDER — SODIUM CHLORIDE 0.9 % IJ SOLN: 9.0000 mL | INTRAMUSCULAR | Status: DC | PRN

## 2014-04-17 MED ORDER — FENTANYL CITRATE 0.05 MG/ML IJ SOLN: 25.0000 ug | INTRAMUSCULAR | Status: DC | PRN

## 2014-04-17 MED ORDER — MIDAZOLAM HCL 2 MG/2ML IJ SOLN
INTRAMUSCULAR | Status: DC | PRN
  Administered 2014-04-17: 2 mg via INTRAVENOUS
  Administered 2014-04-17: 50 mg via INTRAVENOUS

## 2014-04-17 MED ORDER — DEXTROSE-NACL 5-0.45 % IV SOLN
INTRAVENOUS | Status: DC
  Administered 2014-04-17 – 2014-04-18 (×3): via INTRAVENOUS

## 2014-04-17 MED ORDER — KETOROLAC TROMETHAMINE 30 MG/ML IJ SOLN
INTRAMUSCULAR | Status: AC
  Filled 2014-04-17: qty 1

## 2014-04-17 MED ORDER — NALOXONE HCL 1 MG/ML IJ SOLN: 1.0000 ug/kg/h | INTRAVENOUS | Status: DC | PRN

## 2014-04-17 MED ORDER — PREGABALIN 75 MG PO CAPS
75.0000 mg | ORAL_CAPSULE | Freq: Two times a day (BID) | ORAL | Status: DC
  Administered 2014-04-17 – 2014-04-18 (×2): 75 mg via ORAL
  Filled 2014-04-17 (×2): qty 1

## 2014-04-17 MED ORDER — TIZANIDINE HCL 4 MG PO TABS
4.0000 mg | ORAL_TABLET | Freq: Two times a day (BID) | ORAL | Status: DC
  Administered 2014-04-18: 4 mg via ORAL
  Filled 2014-04-17 (×5): qty 1

## 2014-04-17 MED ORDER — SIMETHICONE 80 MG PO CHEW: 80.0000 mg | CHEWABLE_TABLET | Freq: Four times a day (QID) | ORAL | Status: DC | PRN

## 2014-04-17 MED ORDER — OXYCODONE-ACETAMINOPHEN 5-325 MG PO TABS
1.0000 | ORAL_TABLET | ORAL | Status: DC | PRN
  Administered 2014-04-18: 2 via ORAL
  Filled 2014-04-17: qty 2

## 2014-04-17 MED ORDER — CEFAZOLIN SODIUM-DEXTROSE 2-3 GM-% IV SOLR
2.0000 g | INTRAVENOUS | Status: AC
Start: 2014-04-17 — End: 2014-04-17
  Administered 2014-04-17: 2 g via INTRAVENOUS

## 2014-04-17 MED ORDER — LIDOCAINE HCL (CARDIAC) 20 MG/ML IV SOLN
INTRAVENOUS | Status: AC
  Filled 2014-04-17: qty 5

## 2014-04-17 MED ORDER — NEOSTIGMINE METHYLSULFATE 10 MG/10ML IV SOLN
INTRAVENOUS | Status: DC | PRN
  Administered 2014-04-17: 2 mg via INTRAVENOUS

## 2014-04-17 MED ORDER — PROMETHAZINE HCL 25 MG/ML IJ SOLN: 6.2500 mg | INTRAMUSCULAR | Status: DC | PRN

## 2014-04-17 MED ORDER — DIPHENHYDRAMINE HCL 50 MG/ML IJ SOLN: 25.0000 mg | INTRAMUSCULAR | Status: DC | PRN

## 2014-04-17 SURGICAL SUPPLY — 39 items
CANISTER SUCT 3000ML (MISCELLANEOUS) ×3 IMPLANT
CATH ROBINSON RED A/P 16FR (CATHETERS) ×3 IMPLANT
CLOTH BEACON ORANGE TIMEOUT ST (SAFETY) ×3 IMPLANT
CONT PATH 16OZ SNAP LID 3702 (MISCELLANEOUS) ×3 IMPLANT
COVER TABLE BACK 60X90 (DRAPES) ×3 IMPLANT
DECANTER SPIKE VIAL GLASS SM (MISCELLANEOUS) ×9 IMPLANT
DERMABOND ADVANCED (GAUZE/BANDAGES/DRESSINGS) ×1
DERMABOND ADVANCED .7 DNX12 (GAUZE/BANDAGES/DRESSINGS) ×2 IMPLANT
DRAPE HYSTEROSCOPY (DRAPE) ×3 IMPLANT
DRSG COVADERM PLUS 2X2 (GAUZE/BANDAGES/DRESSINGS) ×3 IMPLANT
DRSG OPSITE POSTOP 3X4 (GAUZE/BANDAGES/DRESSINGS) ×3 IMPLANT
DURAPREP 26ML APPLICATOR (WOUND CARE) ×3 IMPLANT
ELECT REM PT RETURN 9FT ADLT (ELECTROSURGICAL) ×3
ELECTRODE REM PT RTRN 9FT ADLT (ELECTROSURGICAL) ×2 IMPLANT
GLOVE BIO SURGEON STRL SZ8 (GLOVE) ×3 IMPLANT
GLOVE BIOGEL PI IND STRL 6.5 (GLOVE) ×2 IMPLANT
GLOVE BIOGEL PI IND STRL 7.0 (GLOVE) ×4 IMPLANT
GLOVE BIOGEL PI INDICATOR 6.5 (GLOVE) ×1
GLOVE BIOGEL PI INDICATOR 7.0 (GLOVE) ×2
GLOVE ORTHO TXT STRL SZ7.5 (GLOVE) ×6 IMPLANT
GOWN STRL REUS W/ TWL LRG LVL3 (GOWN DISPOSABLE) ×14 IMPLANT
GOWN STRL REUS W/TWL LRG LVL3 (GOWN DISPOSABLE) ×13 IMPLANT
NEEDLE INSUFFLATION 120MM (ENDOMECHANICALS) ×3 IMPLANT
NS IRRIG 1000ML POUR BTL (IV SOLUTION) ×3 IMPLANT
PACK LAVH (CUSTOM PROCEDURE TRAY) ×3 IMPLANT
PROTECTOR NERVE ULNAR (MISCELLANEOUS) ×6 IMPLANT
SET CYSTO W/LG BORE CLAMP LF (SET/KITS/TRAYS/PACK) ×3 IMPLANT
SET IRRIG TUBING LAPAROSCOPIC (IRRIGATION / IRRIGATOR) ×3 IMPLANT
SHEARS HARMONIC ACE PLUS 36CM (ENDOMECHANICALS) ×3 IMPLANT
SUT CHROMIC 1MO 4 18 CR8 (SUTURE) ×6 IMPLANT
SUT CHROMIC GUT AB #0 18 (SUTURE) ×3 IMPLANT
SUT SILK 2 0 SH (SUTURE) ×3 IMPLANT
SUT VIC AB 2-0 CT1 (SUTURE) ×3 IMPLANT
SUT VICRYL 4-0 PS2 18IN ABS (SUTURE) ×3 IMPLANT
TOWEL OR 17X24 6PK STRL BLUE (TOWEL DISPOSABLE) ×6 IMPLANT
TRAY FOLEY CATH 14FR (SET/KITS/TRAYS/PACK) ×3 IMPLANT
TROCAR XCEL NON-BLD 5MMX100MML (ENDOMECHANICALS) ×9 IMPLANT
WARMER LAPAROSCOPE (MISCELLANEOUS) ×3 IMPLANT
WATER STERILE IRR 1000ML POUR (IV SOLUTION) ×3 IMPLANT

## 2014-04-17 NOTE — Op Note (Signed)
Preoperative diagnosis: Pelvic pain, AUB Postoperative diagnosis:  Same  Procedure: Laparoscopic-assisted vaginal hysterectomy, bilateral salpingectomy Surgeon: [name] M.D. Assistant: Janyth Contes M.D. Anesthesia: Gen. Endotracheal tube Findings: She had a normal upper abdomen. She had a normal pelvis with an enlarged, globular uterus.  Normal tubes and ovaries with a simple cyst on left ovary. Estimated blood loss: 300 cc Specimens: Uterus sent for routine pathology Complications: None  Procedure in detail: The patient was taken to the operating room and placed in the dorsosupine position. General anesthesia was induced and legs were placed in mobile stirrups and arms tucked to her sides. Abdomen and perineum were then prepped and draped in usual sterile fashion, bladder drained with a red Robinson catheter, Hulka tenaculum applied to the cervix for uterine manipulation. Infraumbilical skin was then infiltrated with quarter percent Marcaine and a 1 cm vertical incision was made. Veress needle was inserted into the peritoneal cavity and placement confirmed by the water drop test an opening pressure of 6 mm mercury. CO2 was insufflated to a pressure of 13 mm mercury and the Veress needle was removed. A 5 mm trocar was introduced with direct visualization. A 5 mm port was then placed on the left side also under direct visualization. Inspection revealed the above-mentioned findings. A third 5 mm port was placed on the right side also under direct visualization.  The distal right fallopian tube was grasped, the Harmonic Scalpel ACE was then used to take down the right mesosalpinx, utero-ovarian ligament, round ligament, right broad ligament.  Bleeding from the right uterine ligament was controlled with the Harmonic scalpel.   A similar procedure was then performed on the left side taking down the mesosalpinx, utero-ovarian pedicle, round ligament, broad ligament. At this point the uterus was  fairly free and there is adequate hemostasis to proceed vaginally.  The legs were elevated in stirrups. A weighted speculum was inserted in the vagina. The cervix was grasped with Ardis Hughs tenaculums. A dilute solution of Pitressin was infiltrated around the cervicovaginal junction which was then incised circumferentially with electrocautery. Sharp dissection was then used to further free the vagina from the cervix. Anterior peritoneum was identified and entered sharply. A Deaver retractor was then to retract the bladder anterior. Posterior cul-de-sac was identified and entered sharply. A Bonnano speculum was placed into the posterior cul-de-sac. Uterosacral ligaments were clamped transected and ligated with #1 chromic and tagged for later use. Cardinal ligaments and uterine arteries were likewise clamped transected and ligated with #1 chromic. The remaining pedicles were clamped, transected and ligated and the uterus was removed. Some bleeding from each side was controlled with #1 Chromic. The uterosacral ligaments were plicated in the midline with 2-0 silk and the previously tagged uterosacral pedicles were also tied in the midline. The vaginal cuff was then closed in a vertical fashion with running locking 2-0 Vicryl with adequate closure and adequate hemostasis. A Foley catheter was then placed.  Attention was turned back to the abdomen. Dr. Sandford Craze and the scrub tech and myself all changed gloves and gowns. The abdomen was reinsufflated. Laparoscope was reinserted and good visualization was achieved. Bleeding from the right ovary was controlled with Harmonic scalpel and bipolar cautery. Both ureters were identified and found to be below incision lines. The 5 mm ports on the each side were removed under direct visualization. The laparoscope was then removed and all gas was allowed to deflate from the abdomen. The umbilical trocar was then removed. Skin incisions were closed with interrupted  subcuticular  sutures of 4-0 Vicryl followed by Dermabond. The patient was awakened in the operating room and taken to the recovery room in stable condition after tolerating the procedure well. Counts were correct x2, she received Ancef 2 g IV the beginning of the procedure, she had PAS hose on throughout the procedure.

## 2014-04-17 NOTE — H&P (Signed)
Kayla Mendoza is an 38 y.o. female. She was seen by her PMD in March and evaluated for pelvic pain, normal exam, normal Pap per pt report, neg GC/CT per pt report.  Sent to me by PMD because they felt pain was from Essure.  Has continued to have pain, mostly in the midline, regular menses monthly with occasional spotting.  Pelvic ultrasound is consistent with adenomyosis.  Urology evaluation for pain and urinary urgency essentially normal, had cystoscopy with resection of a benign lesion in her bladder.  Pt now wishes to proceed with definitive surgical therapy.  Pertinent Gynecological History: Last pap: normal Date: 16 OB History: G7, P2234  SVD x 4  Menstrual History: No LMP recorded.    Past Medical History  Diagnosis Date  . GERD (gastroesophageal reflux disease)   . Anxiety   . Bladder neoplasm   . Urgency of urination   . Pelvic pain in female   . Abnormal uterine bleeding (AUB)   . Arthritis of back   . Pinched vertebral nerve   . Depression   . RLS (restless legs syndrome)   . Nocturia   . H/O hiatal hernia     Past Surgical History  Procedure Laterality Date  . Essure tubal ligation  2010  . Negative sleep study  2013  . Cystoscopy with biopsy N/A 03/08/2014    Procedure: CYSTOSCOPY WITH  TURBT SMALL HYDROTENSION OF BLADDER INSTALLATION OF MARCAINE AND  PYRIDIUM;  Surgeon: Kayla Aloe, MD;  Location: [facility];  Service: Urology;  Laterality: N/A;    Family History  Problem Relation Age of Onset  . Diabetes Paternal Grandmother   . Diabetes Paternal Grandfather   . Hypertension Maternal Grandfather     Social History:  reports that she has never smoked. She has never used smokeless tobacco. She reports that she does not drink alcohol or use illicit drugs.  Allergies:  Allergies  Allergen Reactions  . Neurontin [Gabapentin] Other (See Comments)    Caused chest pain  . Shellfish Allergy Hives  . Tramadol     No prescriptions prior to  admission    Review of Systems  Respiratory: Negative.   Cardiovascular: Negative.   Gastrointestinal: Negative.   Genitourinary: Negative.     There were no vitals taken for this visit. Physical Exam  Constitutional: She appears well-developed and well-nourished.  Neck: Neck supple. No thyromegaly present.  Cardiovascular: Normal rate, regular rhythm and normal heart sounds.   No murmur heard. Respiratory: Effort normal and breath sounds normal. No respiratory distress. She has no wheezes.  GI: Soft. She exhibits no distension and no mass. There is no tenderness.  Genitourinary: Vagina normal.  Uterus is anteverted, slightly enlarged, slightly tender No adnexal mass or tenderness    Results for orders placed during the hospital encounter of 04/16/14 (from the past 24 hour(s))  CBC     Status: Abnormal   Collection Time    04/16/14  1:58 PM      Result Value Ref Range   WBC 9.2  4.0 - 10.5 K/uL   RBC 4.13  3.87 - 5.11 MIL/uL   Hemoglobin 11.9 (*) 12.0 - 15.0 g/dL   HCT 36.8  36.0 - 46.0 %   MCV 89.1  78.0 - 100.0 fL   MCH 28.8  26.0 - 34.0 pg   MCHC 32.3  30.0 - 36.0 g/dL   RDW 13.5  11.5 - 15.5 %   Platelets 235  150 - 400 K/uL  COMPREHENSIVE METABOLIC PANEL     Status: Abnormal   Collection Time    04/16/14  1:58 PM      Result Value Ref Range   Sodium 142  137 - 147 mEq/L   Potassium 3.6 (*) 3.7 - 5.3 mEq/L   Chloride 102  96 - 112 mEq/L   CO2 31  19 - 32 mEq/L   Glucose, Bld 108 (*) 70 - 99 mg/dL   BUN 7  6 - 23 mg/dL   Creatinine, Ser 0.85  0.50 - 1.10 mg/dL   Calcium 9.1  8.4 - 10.5 mg/dL   Total Protein 6.8  6.0 - 8.3 g/dL   Albumin 3.3 (*) 3.5 - 5.2 g/dL   AST 14  0 - 37 U/L   ALT 10  0 - 35 U/L   Alkaline Phosphatase 69  39 - 117 U/L   Total Bilirubin 0.4  0.3 - 1.2 mg/dL   GFR calc non Af Amer 86 (*) >90 mL/min   GFR calc Af Amer >90  >90 mL/min  SURGICAL PCR SCREEN     Status: None   Collection Time    04/16/14  1:59 PM      Result Value Ref  Range   MRSA, PCR NEGATIVE  NEGATIVE   Staphylococcus aureus NEGATIVE  NEGATIVE    No results found.  Assessment/Plan: Pelvic pain and some abnormal bleeding, probable adenomyosis by ultrasound.  All medical and surgical options have been discussed, she wants to proceed with definitive surgical therapy.  Surgical procedure and risks have been discussed, as well as chances of relieving her pain and bleeding.  Will proceed with LAVH, bilateral salpingectomy, possible cystoscopy.  Doing LAVH to look for any source of pain besides adenomyosis and to make sure to remove tubes since has Essure coils in place.  Kayla Mendoza D 04/17/2014, 5:25 AM

## 2014-04-17 NOTE — Addendum Note (Signed)
Addendum created 04/17/14 1638 by Asher Muir, CRNA   Modules edited: Notes Section   Notes Section:  File: [id number]

## 2014-04-17 NOTE — Addendum Note (Signed)
Addendum created 04/17/14 1136 by Rudean Curt, MD   Modules edited: Orders, PRL Based Order Sets

## 2014-04-17 NOTE — Anesthesia Preprocedure Evaluation (Signed)
Anesthesia Evaluation  Patient identified by MRN, date of birth, ID band  Reviewed: Allergy & Precautions, H&P , NPO status , Patient's Chart, lab work & pertinent test results  Airway Mallampati: II TM Distance: >3 FB Neck ROM: full    Dental no notable dental hx. (+) Teeth Intact   Pulmonary neg pulmonary ROS,    Pulmonary exam normal       Cardiovascular negative cardio ROS      Neuro/Psych    GI/Hepatic Neg liver ROS,   Endo/Other    Renal/GU negative Renal ROS     Musculoskeletal   Abdominal Normal abdominal exam  (+)   Peds  Hematology negative hematology ROS (+)   Anesthesia Other Findings   Reproductive/Obstetrics negative OB ROS                           Anesthesia Physical Anesthesia Plan  ASA: II  Anesthesia Plan: General   Post-op Pain Management:    Induction: Intravenous  Airway Management Planned: Oral ETT  Additional Equipment:   Intra-op Plan:   Post-operative Plan: Extubation in OR  Informed Consent: I have reviewed the patients History and Physical, chart, labs and discussed the procedure including the risks, benefits and alternatives for the proposed anesthesia with the patient or authorized representative who has indicated his/her understanding and acceptance.   Dental Advisory Given  Plan Discussed with: CRNA and Surgeon  Anesthesia Plan Comments:         Anesthesia Quick Evaluation

## 2014-04-17 NOTE — Anesthesia Postprocedure Evaluation (Signed)
Anesthesia Post Note  Patient: Kayla Mendoza  Procedure(s) Performed: Procedure(s) (LRB): LAPAROSCOPIC ASSISTED VAGINAL HYSTERECTOMY, Bilateral Salpingectomy (Bilateral) CYSTOSCOPY (N/A)  Anesthesia type: General  Patient location: PACU  Post pain: Pain level controlled  Post assessment: Post-op Vital signs reviewed  Last Vitals:  Filed Vitals:   04/17/14 1115  BP: 107/78  Pulse: 53  Temp:   Resp: 14    Post vital signs: Reviewed  Level of consciousness: sedated  Complications: No apparent anesthesia complications

## 2014-04-17 NOTE — Interval H&P Note (Signed)
History and Physical Interval Note:  04/17/2014 8:21 AM  Kayla Mendoza  has presented today for surgery, with the diagnosis of Pelvic pain and AUB, 58552, 52000  The various methods of treatment have been discussed with the patient and family. After consideration of risks, benefits and other options for treatment, the patient has consented to  Procedure(s): LAPAROSCOPIC ASSISTED VAGINAL HYSTERECTOMY, Bilateral Salpingectomy (Bilateral) CYSTOSCOPY (N/A) as a surgical intervention .  The patient's history has been reviewed, patient examined, no change in status, stable for surgery.  I have reviewed the patient's chart and labs.  Questions were answered to the patient's satisfaction.     Naileah Karg D

## 2014-04-17 NOTE — Progress Notes (Signed)
Patient ID: Kayla Mendoza, female   DOB: 1976-08-13, 38 y.o.   MRN: [id number] DOS VS stable Pt is awake but somewhat lethargic probably secondary to PCA Output is good and urine is clear.

## 2014-04-17 NOTE — Anesthesia Postprocedure Evaluation (Signed)
Anesthesia Post Note  Patient: Kayla Mendoza  Procedure(s) Performed: Procedure(s) (LRB): LAPAROSCOPIC ASSISTED VAGINAL HYSTERECTOMY, Bilateral Salpingectomy (Bilateral) CYSTOSCOPY (N/A)  Anesthesia type: General  Patient location: [facility]  Post pain: Pain level controlled  Post assessment: Post-op Vital signs reviewed  Last Vitals:  Filed Vitals:   04/17/14 1615  BP: 102/54  Pulse: 59  Temp: 36.6 C  Resp: 10    Post vital signs: Reviewed  Level of consciousness: sedated  Complications: No apparent anesthesia complications

## 2014-04-17 NOTE — Transfer of Care (Signed)
Immediate Anesthesia Transfer of Care Note  Patient: Kayla Mendoza  Procedure(s) Performed: Procedure(s) with comments: LAPAROSCOPIC ASSISTED VAGINAL HYSTERECTOMY, Bilateral Salpingectomy (Bilateral) - clean/contaminated CYSTOSCOPY (N/A) - clean/contaminated  Patient Location: PACU  Anesthesia Type:General  Level of Consciousness: awake  Airway & Oxygen Therapy: Patient Spontanous Breathing  Post-op Assessment: Report given to PACU RN  Post vital signs: stable  Filed Vitals:   04/17/14 0803  BP: 115/88  Pulse: 66  Temp: 36.8 C  Resp: 18    Complications: No apparent anesthesia complications

## 2014-04-18 ENCOUNTER — Encounter (HOSPITAL_COMMUNITY): Payer: Self-pay | Admitting: Obstetrics and Gynecology

## 2014-04-18 DIAGNOSIS — N925 Other specified irregular menstruation: Secondary | ICD-10-CM | POA: Diagnosis not present

## 2014-04-18 DIAGNOSIS — N949 Unspecified condition associated with female genital organs and menstrual cycle: Secondary | ICD-10-CM | POA: Diagnosis not present

## 2014-04-18 LAB — CBC
HCT: 30.8 % — ABNORMAL LOW (ref 36.0–46.0)
Hemoglobin: 10.1 g/dL — ABNORMAL LOW (ref 12.0–15.0)
MCH: 29.1 pg (ref 26.0–34.0)
MCHC: 32.8 g/dL (ref 30.0–36.0)
MCV: 88.8 fL (ref 78.0–100.0)
PLATELETS: 198 10*3/uL (ref 150–400)
RBC: 3.47 MIL/uL — ABNORMAL LOW (ref 3.87–5.11)
RDW: 13.4 % (ref 11.5–15.5)
WBC: 12.6 10*3/uL — ABNORMAL HIGH (ref 4.0–10.5)

## 2014-04-18 MED ORDER — OXYCODONE-ACETAMINOPHEN 5-325 MG PO TABS: 1.0000 | ORAL_TABLET | ORAL | Status: DC | PRN

## 2014-04-18 NOTE — Discharge Instructions (Signed)
Call for increasing pain, heavy vaginal bleeding, temp >100

## 2014-04-18 NOTE — Progress Notes (Signed)
POD #1 LAVH Doing ok, pain ok, no n/v this am Afeb, VSS Abd- soft, dressings C/D/I CBC    Component Value Date/Time   WBC 12.6* 04/18/2014 0518   RBC 3.47* 04/18/2014 0518   HGB 10.1* 04/18/2014 0518   HCT 30.8* 04/18/2014 0518   PLT 198 04/18/2014 0518   MCV 88.8 04/18/2014 0518   MCH 29.1 04/18/2014 0518   MCHC 32.8 04/18/2014 0518   RDW 13.4 04/18/2014 0518   LYMPHSABS 1.7 07/24/2012 1048   MONOABS 0.3 07/24/2012 1048   EOSABS 0.4 07/24/2012 1048   BASOSABS 0.0 07/24/2012 1048    Ambulate, d/c home later today unless has a problem

## 2014-04-18 NOTE — Progress Notes (Signed)
Pt is discharged in the care of Mother, Downstairs per ambulatory. Discharge instructions were given to pt. No equipment was needed. Questions were asked and answered. Abdominal incision clean and dry. Stable,

## 2014-04-20 ENCOUNTER — Emergency Department (HOSPITAL_COMMUNITY)
Admission: EM | Admit: 2014-04-20 | Discharge: 2014-04-20 | Disposition: A | Discharge status: Home / Self Care | Payer: Medicaid Other | Attending: Emergency Medicine | Admitting: Emergency Medicine

## 2014-04-20 ENCOUNTER — Emergency Department (HOSPITAL_COMMUNITY): Payer: Medicaid Other

## 2014-04-20 ENCOUNTER — Encounter (HOSPITAL_COMMUNITY): Payer: Self-pay | Admitting: Emergency Medicine

## 2014-04-20 DIAGNOSIS — R002 Palpitations: Secondary | ICD-10-CM | POA: Diagnosis not present

## 2014-04-20 DIAGNOSIS — Z8742 Personal history of other diseases of the female genital tract: Secondary | ICD-10-CM | POA: Diagnosis not present

## 2014-04-20 DIAGNOSIS — Z791 Long term (current) use of non-steroidal anti-inflammatories (NSAID): Secondary | ICD-10-CM | POA: Insufficient documentation

## 2014-04-20 DIAGNOSIS — G2581 Restless legs syndrome: Secondary | ICD-10-CM | POA: Diagnosis not present

## 2014-04-20 DIAGNOSIS — Z79899 Other long term (current) drug therapy: Secondary | ICD-10-CM | POA: Insufficient documentation

## 2014-04-20 DIAGNOSIS — R079 Chest pain, unspecified: Secondary | ICD-10-CM | POA: Diagnosis not present

## 2014-04-20 DIAGNOSIS — Z8719 Personal history of other diseases of the digestive system: Secondary | ICD-10-CM | POA: Insufficient documentation

## 2014-04-20 DIAGNOSIS — F329 Major depressive disorder, single episode, unspecified: Secondary | ICD-10-CM | POA: Insufficient documentation

## 2014-04-20 DIAGNOSIS — F411 Generalized anxiety disorder: Secondary | ICD-10-CM | POA: Insufficient documentation

## 2014-04-20 DIAGNOSIS — F3289 Other specified depressive episodes: Secondary | ICD-10-CM | POA: Diagnosis not present

## 2014-04-20 DIAGNOSIS — R609 Edema, unspecified: Secondary | ICD-10-CM | POA: Insufficient documentation

## 2014-04-20 DIAGNOSIS — M479 Spondylosis, unspecified: Secondary | ICD-10-CM | POA: Insufficient documentation

## 2014-04-20 LAB — CBC WITH DIFFERENTIAL/PLATELET
BASOS PCT: 0 % (ref 0–1)
Basophils Absolute: 0 10*3/uL (ref 0.0–0.1)
Eosinophils Absolute: 0.2 10*3/uL (ref 0.0–0.7)
Eosinophils Relative: 2 % (ref 0–5)
HEMATOCRIT: 32.3 % — AB (ref 36.0–46.0)
Hemoglobin: 10.4 g/dL — ABNORMAL LOW (ref 12.0–15.0)
LYMPHS ABS: 1.5 10*3/uL (ref 0.7–4.0)
Lymphocytes Relative: 20 % (ref 12–46)
MCH: 28.6 pg (ref 26.0–34.0)
MCHC: 32.2 g/dL (ref 30.0–36.0)
MCV: 88.7 fL (ref 78.0–100.0)
Monocytes Absolute: 0.6 10*3/uL (ref 0.1–1.0)
Monocytes Relative: 8 % (ref 3–12)
NEUTROS ABS: 5.3 10*3/uL (ref 1.7–7.7)
NEUTROS PCT: 70 % (ref 43–77)
Platelets: 232 10*3/uL (ref 150–400)
RBC: 3.64 MIL/uL — ABNORMAL LOW (ref 3.87–5.11)
RDW: 13.4 % (ref 11.5–15.5)
WBC: 7.6 10*3/uL (ref 4.0–10.5)

## 2014-04-20 LAB — URINALYSIS, ROUTINE W REFLEX MICROSCOPIC
Bilirubin Urine: NEGATIVE
Glucose, UA: NEGATIVE mg/dL
Hgb urine dipstick: NEGATIVE
Ketones, ur: NEGATIVE mg/dL
Leukocytes, UA: NEGATIVE
NITRITE: NEGATIVE
Protein, ur: NEGATIVE mg/dL
SPECIFIC GRAVITY, URINE: 1.008 (ref 1.005–1.030)
Urobilinogen, UA: 0.2 mg/dL (ref 0.0–1.0)
pH: 7.5 (ref 5.0–8.0)

## 2014-04-20 LAB — I-STAT TROPONIN, ED
TROPONIN I, POC: 0 ng/mL (ref 0.00–0.08)
Troponin i, poc: 0 ng/mL (ref 0.00–0.08)

## 2014-04-20 LAB — BASIC METABOLIC PANEL
Anion gap: 9 (ref 5–15)
BUN: 5 mg/dL — AB (ref 6–23)
CHLORIDE: 105 meq/L (ref 96–112)
CO2: 31 mEq/L (ref 19–32)
Calcium: 8.6 mg/dL (ref 8.4–10.5)
Creatinine, Ser: 0.81 mg/dL (ref 0.50–1.10)
GFR calc non Af Amer: >90 mL/min (ref >90)
GLUCOSE: 90 mg/dL (ref 70–99)
POTASSIUM: 4 meq/L (ref 3.7–5.3)
SODIUM: 145 meq/L (ref 137–147)

## 2014-04-20 LAB — POC URINE PREG, ED: PREG TEST UR: NEGATIVE

## 2014-04-20 MED ORDER — KETOROLAC TROMETHAMINE 30 MG/ML IJ SOLN
30.0000 mg | Freq: Once | INTRAMUSCULAR | Status: AC
  Administered 2014-04-20: 30 mg via INTRAVENOUS
  Filled 2014-04-20: qty 1

## 2014-04-20 MED ORDER — SODIUM CHLORIDE 0.9 % IV BOLUS (SEPSIS)
1000.0000 mL | Freq: Once | INTRAVENOUS | Status: AC
  Administered 2014-04-20: 1000 mL via INTRAVENOUS

## 2014-04-20 MED ORDER — IOHEXOL 350 MG/ML SOLN
100.0000 mL | Freq: Once | INTRAVENOUS | Status: AC | PRN
  Administered 2014-04-20: 80 mL via INTRAVENOUS

## 2014-04-20 MED ORDER — FENTANYL CITRATE 0.05 MG/ML IJ SOLN
50.0000 ug | Freq: Once | INTRAMUSCULAR | Status: AC
  Administered 2014-04-20: 50 ug via INTRAVENOUS
  Filled 2014-04-20: qty 2

## 2014-04-20 NOTE — ED Notes (Signed)
Of unit with CT 

## 2014-04-20 NOTE — Discharge Instructions (Signed)

## 2014-04-20 NOTE — ED Notes (Signed)
Pt back from CT

## 2014-04-20 NOTE — ED Notes (Signed)
Pt taken off floor to CT.

## 2014-04-20 NOTE — ED Notes (Signed)
MD at bedside. 

## 2014-04-20 NOTE — ED Notes (Signed)
Ct called for transport

## 2014-04-20 NOTE — Discharge Summary (Signed)
Physician Discharge Summary  Patient ID: Kayla Mendoza MRN: [id number] DOB/AGE: 01-31-1976 38 y.o.  Admit date: 04/17/2014 Discharge date: 04/18/2014  Admission Diagnoses:  Pelvic pain, AUB  Discharge Diagnoses: Same Active Problems:   Pelvic pain in female   Discharged Condition: good  Hospital Course: Pt admitted for LAVH/bilateral salpingectomy without complications.  POD #1 stable for discharge.  Discharge Exam: Blood pressure 133/82, pulse 74, temperature 97.6 F (36.4 C), temperature source Oral, resp. rate 18, height 5\' 4"  (1.626 m), weight 79.379 kg (175 lb), SpO2 100.00%. General appearance: alert  Disposition: 01-Home or Self Care  Discharge Instructions   Diet - low sodium heart healthy    Complete by:  As directed      Increase activity slowly    Complete by:  As directed      Lifting restrictions    Complete by:  As directed   10 lbs     Sexual Activity Restrictions    Complete by:  As directed   Pelvic rest            Medication List         escitalopram 10 MG tablet  Commonly known as:  LEXAPRO  Take 20 mg by mouth daily.     ibuprofen 200 MG tablet  Commonly known as:  ADVIL,MOTRIN  Take 600 mg by mouth every 6 (six) hours as needed for moderate pain.     oxyCODONE-acetaminophen 5-325 MG per tablet  Commonly known as:  PERCOCET/ROXICET  Take 1-2 tablets by mouth every 4 (four) hours as needed for severe pain (moderate to severe pain (when tolerating fluids)).     pregabalin 75 MG capsule  Commonly known as:  LYRICA  Take 75 mg by mouth 2 (two) times daily.     sucralfate 1 G tablet  Commonly known as:  CARAFATE  Take 1 g by mouth 3 (three) times daily with meals.     tiZANidine 2 MG tablet  Commonly known as:  ZANAFLEX  Take 4 mg by mouth 2 (two) times daily.     valACYclovir 1000 MG tablet  Commonly known as:  VALTREX  Take 1,000 mg by mouth daily.     WELLBUTRIN SR 150 MG 12 hr tablet  Generic drug:  buPROPion  Take 150 mg by mouth  2 (two) times daily.           Follow-up Information   Follow up with Hiren Peplinski D, MD. Schedule an appointment as soon as possible for a visit in 2 weeks.   Specialty:  Obstetrics and Gynecology   Contact information:   [address] [place] [address] [phone]       Signed: Clarene Duke 04/20/2014, 10:20 AM

## 2014-04-20 NOTE — ED Provider Notes (Signed)
CSN: [id number]     Arrival date & time 04/20/14  1415 History   First MD Initiated Contact with Patient 04/20/14 1456     Chief Complaint  Patient presents with  . Chest Pain     (Consider location/radiation/quality/duration/timing/severity/associated sxs/prior Treatment) Patient is a 38 y.o. female presenting with chest pain. The history is provided by the patient. No language interpreter was used.  Chest Pain Pain location:  Substernal area Pain quality: pressure   Radiates to: to R chest. Pain radiates to the back: no   Pain severity:  Moderate (was 7/10, now 2/10) Onset quality:  Sudden Duration:  2 hours Timing:  Constant Progression:  Improving Chronicity:  New Context: at rest   Relieved by:  Nothing Worsened by:  Deep breathing Associated symptoms: lower extremity edema, palpitations and shortness of breath   Associated symptoms: no abdominal pain, no back pain, no cough, no diaphoresis, no fatigue, no fever, no headache, no nausea, no near-syncope, no numbness, not vomiting and no weakness   Risk factors: surgery (hysterectomy 3 days ago)   Risk factors: no prior DVT/PE   Risk factors comment:  Fhx of clotting d/o, Fhx of premature CAD   Past Medical History  Diagnosis Date  . GERD (gastroesophageal reflux disease)   . Anxiety   . Bladder neoplasm   . Urgency of urination   . Pelvic pain in female   . Abnormal uterine bleeding (AUB)   . Arthritis of back   . Pinched vertebral nerve   . Depression   . RLS (restless legs syndrome)   . Nocturia   . H/O hiatal hernia    Past Surgical History  Procedure Laterality Date  . Essure tubal ligation  2010  . Negative sleep study  2013  . Cystoscopy with biopsy N/A 03/08/2014    Procedure: CYSTOSCOPY WITH  TURBT SMALL HYDROTENSION OF BLADDER INSTALLATION OF MARCAINE AND  PYRIDIUM;  Surgeon: [name], MD;  Location: [facility];  Service: Urology;  Laterality: N/A;  . Laparoscopic assisted  vaginal hysterectomy Bilateral 04/17/2014    Procedure: LAPAROSCOPIC ASSISTED VAGINAL HYSTERECTOMY, Bilateral Salpingectomy;  Surgeon: [name], MD;  Location: [facility];  Service: Gynecology;  Laterality: Bilateral;  clean/contaminated  . Cystoscopy N/A 04/17/2014    Procedure: CYSTOSCOPY;  Surgeon: [name], MD;  Location: [facility];  Service: Gynecology;  Laterality: N/A;  clean/contaminated   Family History  Problem Relation Age of Onset  . Diabetes Paternal Grandmother   . Diabetes Paternal Grandfather   . Hypertension Maternal Grandfather    History  Substance Use Topics  . Smoking status: Never Smoker   . Smokeless tobacco: Never Used  . Alcohol Use: No   OB History   Grav Para Term Preterm Abortions TAB SAB Ect Mult Living                 Review of Systems  Constitutional: Negative for fever, chills, diaphoresis, activity change, appetite change and fatigue.  HENT: Negative for congestion, facial swelling, rhinorrhea and sore throat.   Eyes: Negative for photophobia and discharge.  Respiratory: Positive for shortness of breath. Negative for cough and chest tightness.   Cardiovascular: Positive for chest pain and palpitations. Negative for leg swelling and near-syncope.  Gastrointestinal: Negative for nausea, vomiting, abdominal pain and diarrhea.  Endocrine: Negative for polydipsia and polyuria.  Genitourinary: Negative for dysuria, frequency, difficulty urinating and pelvic pain.  Musculoskeletal: Negative for arthralgias, back pain, neck pain and neck stiffness.  Skin: Negative  for color change and wound.  Allergic/Immunologic: Negative for immunocompromised state.  Neurological: Negative for facial asymmetry, weakness, numbness and headaches.  Hematological: Does not bruise/bleed easily.  Psychiatric/Behavioral: Negative for confusion and agitation.      Allergies  Neurontin; Shellfish allergy; and Tramadol  Home Medications   Prior to Admission medications    Medication Sig Start Date End Date Taking? Authorizing Provider  ALPRAZolam Duanne Moron) 1 MG tablet Take 1 mg by mouth 3 (three) times daily as needed for anxiety.   Yes Historical Provider, MD  buPROPion (WELLBUTRIN SR) 150 MG 12 hr tablet Take 150 mg by mouth 2 (two) times daily.   Yes Historical Provider, MD  docusate sodium (COLACE) 100 MG capsule Take 100 mg by mouth daily as needed for mild constipation.   Yes Historical Provider, MD  escitalopram (LEXAPRO) 10 MG tablet Take 20 mg by mouth daily.    Yes Historical Provider, MD  ibuprofen (ADVIL,MOTRIN) 200 MG tablet Take 600 mg by mouth every 6 (six) hours as needed for moderate pain.    Yes Historical Provider, MD  oxyCODONE-acetaminophen (PERCOCET/ROXICET) 5-325 MG per tablet Take 1-2 tablets by mouth every 4 (four) hours as needed for severe pain (moderate to severe pain (when tolerating fluids)). 04/18/14  Yes [name], MD  pregabalin (LYRICA) 75 MG capsule Take 75 mg by mouth 2 (two) times daily.   Yes Historical Provider, MD  sucralfate (CARAFATE) 1 G tablet Take 1 g by mouth 3 (three) times daily with meals.    Yes Historical Provider, MD  tiZANidine (ZANAFLEX) 2 MG tablet Take 4 mg by mouth 2 (two) times daily. 06/28/13  Yes Dennie Bible, NP  valACYclovir (VALTREX) 1000 MG tablet Take 1,000 mg by mouth daily.   Yes Historical Provider, MD   BP 129/79  Pulse 84  Temp(Src) 97.9 F (36.6 C) (Oral)  Resp 19  SpO2 99%  LMP 03/28/2014 Physical Exam  Constitutional: She is oriented to person, place, and time. She appears well-developed and well-nourished. No distress.  HENT:  Head: Normocephalic and atraumatic.  Mouth/Throat: No oropharyngeal exudate.  Eyes: Pupils are equal, round, and reactive to light.  Neck: Normal range of motion. Neck supple.  Cardiovascular: Normal rate, regular rhythm and normal heart sounds.  Exam reveals no gallop and no friction rub.   No murmur heard. Pulmonary/Chest: Effort normal and breath  sounds normal. No respiratory distress. She has no wheezes. She has no rales. She exhibits tenderness.    Abdominal: Soft. Bowel sounds are normal. She exhibits no distension and no mass. There is no tenderness. There is no rebound and no guarding.  Musculoskeletal: Normal range of motion. She exhibits edema (1+ blle pitting edema w/o pain, symmetric). She exhibits no tenderness.  Neurological: She is alert and oriented to person, place, and time.  Skin: Skin is warm and dry.  Psychiatric: She has a normal mood and affect.    ED Course  Procedures (including critical care time) Labs Review Labs Reviewed  CBC WITH DIFFERENTIAL - Abnormal; Notable for the following:    RBC 3.64 (*)    Hemoglobin 10.4 (*)    HCT 32.3 (*)    All other components within normal limits  BASIC METABOLIC PANEL - Abnormal; Notable for the following:    BUN 5 (*)    All other components within normal limits  URINE CULTURE  URINALYSIS, ROUTINE W REFLEX MICROSCOPIC  I-STAT TROPOININ, ED  POC URINE PREG, ED  Randolm Idol, ED  Imaging Review Dg Chest 2 View  04/20/2014   CLINICAL DATA:  Right-sided chest pressure  EXAM: CHEST  2 VIEW  COMPARISON:  None.  FINDINGS: The heart size and mediastinal contours are within normal limits. Both lungs are clear. The visualized skeletal structures are unremarkable.  IMPRESSION: No active cardiopulmonary disease.   Electronically Signed   By: Kathreen Devoid   On: 04/20/2014 15:54   Ct Angio Chest Pe W/cm &/or Wo Cm  04/20/2014   CLINICAL DATA:  Four days post hysterectomy, presenting now with chest pain and pressure associated with tachycardia. Chest pain unrelieved with nitroglycerin.  EXAM: CT ANGIOGRAPHY CHEST WITH CONTRAST  TECHNIQUE: Multidetector CT imaging of the chest was performed using the standard protocol during bolus administration of intravenous contrast. Multiplanar CT image reconstructions and MIPs were obtained to evaluate the vascular anatomy.  CONTRAST:   60mL OMNIPAQUE IOHEXOL 350 MG/ML IV.  COMPARISON:  CTA chest 07/24/2012.  FINDINGS: Contrast opacification of the pulmonary arteries is very good. No filling defects within either main pulmonary artery or their branches in either lung to suggest pulmonary embolism. Heart size upper normal. No visible coronary atherosclerosis. No pericardial effusion. No visible atherosclerosis involving the thoracic or upper abdominal aorta or their branches. Bovine aortic arch anatomy (left common carotid artery arises from the innominate artery).  Pulmonary parenchyma clear without localized airspace consolidation, interstitial disease, or parenchymal nodules or masses. Expected dependent atelectasis posteriorly in the lower lobes. Central airways patent without significant bronchial wall thickening. No pleural effusions. Note made of an accessory fissure in the right lower lobe.  Moderate-sized hiatal hernia, unchanged since the prior examination. No significant mediastinal, hilar, or axillary lymphadenopathy. Visualized thyroid gland unremarkable.  Visualized upper abdomen unremarkable for the early arterial phase of enhancement. Bone window images unremarkable.  Review of the MIP images confirms the above findings.  IMPRESSION: 1. No evidence of pulmonary embolism. 2.  No acute cardiopulmonary disease. 3. Stable moderate-sized hiatal hernia.   Electronically Signed   By: Evangeline Dakin M.D.   On: 04/20/2014 18:38     EKG Interpretation   Date/Time:  Saturday April 20 2014 14:25:28 EDT Ventricular Rate:  95 PR Interval:  136 QRS Duration: 84 QT Interval:  362 QTC Calculation: 455 R Axis:   57 Text Interpretation:  Sinus rhythm new Q wave lead III, and new TWI lead  III.  Confirmed by DOCHERTY  MD, [place] [phone]) on 04/20/2014 2:58:51 PM      MDM   Final diagnoses:  Chest pain, unspecified chest pain type    Pt is a 38 y.o. female with Pmhx as above who presents with sudden onset central chest pressure with  assoc shallow breathing, sweaty palms, and sensation of heart racing while at rest about 2.5 hrs ago. She was given ASA and SL NTG x 1 PTA.  Pain from 7/10, now 2/10. She had fhx of premature CAD, as well as multiple family members with unknown heme d/o leading to blood clots.  Alos has hx of panic attacks.  She had a transvaginal hysterectomy 3 days ago. She reports equal BLLE edema w/o pain. CP worse w/ deep breathing and palpation. On PE, VSS, pt in NAD.  Cardiopulm exam benign except reproducible CP. 1+ BLLE edema which is symmetric.  EKG w/ new TWI lead III. First trop negative, CXR nml.  Cannot use PERC score due to risk factors.  Will get CTA chest ot r/o PE.   CTA negative. Delta trop negative. Symptoms atypical  for ACS< which I doubt, but will refer her to PCP for ex stress testing later this week due to FHx of CAD.  Return precautions given for new or worsening symptoms including worsening pain, SOB, fever.          Neta Ehlers, MD 04/21/14 1120

## 2014-04-20 NOTE — ED Notes (Addendum)
Ems brough pt from home with c/o chest pain/pressure x1 hour.  PT had hysterectomy 4 days ago no complications.  States felt chest tightness and fast heart rate.  No HX of cardiac.  HX of anxiety.  Allergy to shellfish.  EMS gave her 4 baby aspirin, nitro without relief.  22 g in L forearm.  CBG 117

## 2014-04-21 LAB — URINE CULTURE

## 2014-05-01 ENCOUNTER — Encounter: Payer: Self-pay | Admitting: *Deleted

## 2014-05-23 NOTE — Telephone Encounter (Signed)
NOTED

## 2014-06-17 DIAGNOSIS — Z0289 Encounter for other administrative examinations: Secondary | ICD-10-CM

## 2014-08-12 ENCOUNTER — Ambulatory Visit: Payer: Medicaid Other | Attending: Urology | Admitting: Physical Therapy

## 2014-08-12 DIAGNOSIS — M545 Low back pain: Secondary | ICD-10-CM | POA: Diagnosis present

## 2014-09-23 ENCOUNTER — Encounter (HOSPITAL_COMMUNITY): Payer: Self-pay | Admitting: Obstetrics and Gynecology

## 2014-10-03 ENCOUNTER — Other Ambulatory Visit: Payer: Self-pay | Admitting: Gastroenterology

## 2014-10-03 DIAGNOSIS — R1012 Left upper quadrant pain: Secondary | ICD-10-CM

## 2014-10-08 ENCOUNTER — Ambulatory Visit
Admission: RE | Admit: 2014-10-08 | Discharge: 2014-10-08 | Disposition: A | Discharge status: Home / Self Care | Payer: Medicaid Other | Source: Ambulatory Visit | Attending: Gastroenterology | Admitting: Gastroenterology

## 2014-10-08 DIAGNOSIS — R1012 Left upper quadrant pain: Secondary | ICD-10-CM

## 2014-12-16 ENCOUNTER — Telehealth: Payer: Self-pay | Admitting: Oncology

## 2014-12-16 NOTE — Telephone Encounter (Signed)
Chart delivered 12/16/14.  TG

## 2014-12-16 NOTE — Telephone Encounter (Signed)
Pt confirmed appt for 12/17/14 at 10:30 w/Shadad Dx: abnormal IFE Referring Dr. Estanislado Pandy

## 2014-12-17 ENCOUNTER — Other Ambulatory Visit: Payer: Medicaid Other

## 2014-12-17 ENCOUNTER — Telehealth: Payer: Self-pay | Admitting: Oncology

## 2014-12-17 ENCOUNTER — Ambulatory Visit (HOSPITAL_BASED_OUTPATIENT_CLINIC_OR_DEPARTMENT_OTHER): Payer: Medicaid Other | Admitting: Oncology

## 2014-12-17 ENCOUNTER — Encounter (INDEPENDENT_AMBULATORY_CARE_PROVIDER_SITE_OTHER): Payer: Self-pay

## 2014-12-17 ENCOUNTER — Encounter: Payer: Self-pay | Admitting: Oncology

## 2014-12-17 ENCOUNTER — Ambulatory Visit: Payer: Medicaid Other

## 2014-12-17 VITALS — BP 122/75 | HR 91 | Temp 98.5°F | Resp 19 | Ht 64.0 in | Wt 172.9 lb

## 2014-12-17 DIAGNOSIS — M898X9 Other specified disorders of bone, unspecified site: Secondary | ICD-10-CM

## 2014-12-17 DIAGNOSIS — D472 Monoclonal gammopathy: Secondary | ICD-10-CM

## 2014-12-17 DIAGNOSIS — G8929 Other chronic pain: Secondary | ICD-10-CM

## 2014-12-17 DIAGNOSIS — F411 Generalized anxiety disorder: Secondary | ICD-10-CM

## 2014-12-17 NOTE — Progress Notes (Signed)
Checked in new pt with no financial concerns at this time.  Pt's insurance should pay at 100% but she has Raquel's card for any billing questions or concerns.

## 2014-12-17 NOTE — Progress Notes (Signed)
Please see consult note.  

## 2014-12-17 NOTE — Telephone Encounter (Signed)
gv adn printed appt sched and avs for pt for Sept.... °

## 2014-12-17 NOTE — Consult Note (Signed)
Reason for Referral: Monoclonal protein.    HPI: 39 year old woman with history of anxiety and arthritis with chronic pain issues referred to me for evaluation of an abnormal serum protein electrophoresis. She was referred to rheumatology for the evaluation of lower back pain and scoliosis. She had an MRI which confirmed these findings. She was evaluated by rheumatology and had a extensive laboratory testing. Her labs on 11/26/2014 showed a normal hemoglobin of 13.7 and a normal white cell count of 7.0 with a platelet count of 372. Her creatinine was 0.76, total protein is 7.0 with a normal calcium and albumin. She had a normal quantitative immunoglobulins with IgG, IgA, and IgM levels within normal range. Her serum protein electrophoresis did pick up an M spike of 0.37 g/dL. Her immunofixation showed a monoclonal protein in the form of IgG kappa subtype. Clinically, she has chronic symptoms of back pain, hip pain and knee pain. She does not report any headaches, blurry vision, double vision or syncope or seizures. Her appetite had been reasonable but she did have about 9 pound weight loss. She does not report any fevers, chills or sweats. She does not report any lymphadenopathy or petechiae. She does not report any chest pain, shortness of breath, orthopnea or leg edema. Does not report any cough, wheezing or difficulty breathing. She does not report any nausea, vomiting she does report constipation but no diarrhea. She does not report any frequency, urgency or hesitancy. She has multiple skeletal issues as outlined above. She does not report any neuropathy or opportunistic infections. Rest of her review of systems unremarkable.    Past Medical History  Diagnosis Date  . GERD (gastroesophageal reflux disease)   . Anxiety   . Bladder neoplasm   . Urgency of urination   . Pelvic pain in female   . Abnormal uterine bleeding (AUB)   . Arthritis of back   . Pinched vertebral nerve   . Depression   . RLS  (restless legs syndrome)   . Nocturia   . H/O hiatal hernia   :  Past Surgical History  Procedure Laterality Date  . Essure tubal ligation  2010  . Negative sleep study  2013  . Cystoscopy with biopsy N/A 03/08/2014    Procedure: CYSTOSCOPY WITH  TURBT SMALL HYDROTENSION OF BLADDER INSTALLATION OF MARCAINE AND  PYRIDIUM;  Surgeon: [name], MD;  Location: [facility];  Service: Urology;  Laterality: N/A;  . Laparoscopic assisted vaginal hysterectomy Bilateral 04/17/2014    Procedure: LAPAROSCOPIC ASSISTED VAGINAL HYSTERECTOMY, Bilateral Salpingectomy;  Surgeon: [name], MD;  Location: [facility];  Service: Gynecology;  Laterality: Bilateral;  clean/contaminated  . Cystoscopy N/A 04/17/2014    Procedure: CYSTOSCOPY;  Surgeon: [name], MD;  Location: [facility];  Service: Gynecology;  Laterality: N/A;  clean/contaminated  :   Current outpatient prescriptions:  .  ALPRAZolam (XANAX) 1 MG tablet, Take 1 mg by mouth 3 (three) times daily as needed for anxiety., Disp: , Rfl:  .  buPROPion (WELLBUTRIN SR) 150 MG 12 hr tablet, Take 300 mg by mouth daily. Take 300 mg in am and 150 mg pm, Disp: , Rfl:  .  docusate sodium (COLACE) 100 MG capsule, Take 100 mg by mouth daily as needed for mild constipation., Disp: , Rfl:  .  ibuprofen (ADVIL,MOTRIN) 200 MG tablet, Take 600 mg by mouth every 6 (six) hours as needed for moderate pain. , Disp: , Rfl:  .  tiZANidine (ZANAFLEX) 2 MG tablet, Take 4 mg by  mouth 2 (two) times daily., Disp: , Rfl:  .  valACYclovir (VALTREX) 1000 MG tablet, Take 1,000 mg by mouth daily., Disp: , Rfl: :  Allergies  Allergen Reactions  . Neurontin [Gabapentin] Other (See Comments)    Caused chest pain  . Shellfish Allergy Hives  . Tramadol Itching and Rash  :  Family History  Problem Relation Age of Onset  . Diabetes Paternal Grandmother   . Diabetes Paternal Grandfather   . Hypertension Maternal Grandfather   :  History   Social History  .  Marital Status: Legally Separated    Spouse Name: N/A  . Number of Children: 4  . Years of Education: College   Occupational History  .      n/a   Social History Main Topics  . Smoking status: Never Smoker   . Smokeless tobacco: Never Used  . Alcohol Use: No  . Drug Use: No  . Sexual Activity: Not on file   Other Topics Concern  . Not on file   Social History Narrative   Patient is divorced.   Patient lives at home with her 4 children.    Patient has a college degree.   Patient is right-handed.   Patient drinks very little caffeine-coffee.           :  Pertinent items are noted in HPI.  Exam: Blood pressure 122/75, pulse 91, temperature 98.5 F (36.9 C), temperature source Oral, resp. rate 19, height '5\' 4"'  (1.626 m), weight 172 lb 14.4 oz (78.427 kg), last menstrual period 03/28/2014, SpO2 100 %. General appearance: alert and cooperative Head: Normocephalic, without obvious abnormality Nose: Nares normal. Septum midline. Mucosa normal. No drainage or sinus tenderness. Throat: lips, mucosa, and tongue normal; teeth and gums normal Neck: no adenopathy Back: negative Resp: clear to auscultation bilaterally Chest wall: no tenderness Cardio: regular rate and rhythm, S1, S2 normal, no murmur, click, rub or gallop GI: soft, non-tender; bowel sounds normal; no masses,  no organomegaly Extremities: extremities normal, atraumatic, no cyanosis or edema Pulses: 2+ and symmetric Skin: Skin color, texture, turgor normal. No rashes or lesions Lymph nodes: Cervical, supraclavicular, and axillary nodes normal. Neurologic: Grossly normal  CBC    Component Value Date/Time   WBC 7.6 04/20/2014 1618   RBC 3.64* 04/20/2014 1618   HGB 10.4* 04/20/2014 1618   HCT 32.3* 04/20/2014 1618   PLT 232 04/20/2014 1618   MCV 88.7 04/20/2014 1618   MCH 28.6 04/20/2014 1618   MCHC 32.2 04/20/2014 1618   RDW 13.4 04/20/2014 1618   LYMPHSABS 1.5 04/20/2014 1618   MONOABS 0.6 04/20/2014  1618   EOSABS 0.2 04/20/2014 1618   BASOSABS 0.0 04/20/2014 1618      Chemistry      Component Value Date/Time   NA 145 04/20/2014 1618   K 4.0 04/20/2014 1618   CL 105 04/20/2014 1618   CO2 31 04/20/2014 1618   BUN 5* 04/20/2014 1618   CREATININE 0.81 04/20/2014 1618      Component Value Date/Time   CALCIUM 8.6 04/20/2014 1618   ALKPHOS 69 04/16/2014 1358   AST 14 04/16/2014 1358   ALT 10 04/16/2014 1358   BILITOT 0.4 04/16/2014 1358     IMPRESSION: Abnormal MRI scan lumbar spine showing mild degenerative changes throughout most severe at L2-3 where there is extra foraminal disc protrusion to the left resulting in possible encroachment of the L2 nerve root. These changes appear to unchanged compared with previous MRI scan dated 02/12/2012  CLINICAL DATA: Four days post hysterectomy, presenting now with chest pain and pressure associated with tachycardia. Chest pain unrelieved with nitroglycerin.  EXAM: CT ANGIOGRAPHY CHEST WITH CONTRAST  TECHNIQUE: Multidetector CT imaging of the chest was performed using the standard protocol during bolus administration of intravenous contrast. Multiplanar CT image reconstructions and MIPs were obtained to evaluate the vascular anatomy.  CONTRAST: 23m OMNIPAQUE IOHEXOL 350 MG/ML IV.  COMPARISON: CTA chest 07/24/2012.  FINDINGS: Contrast opacification of the pulmonary arteries is very good. No filling defects within either main pulmonary artery or their branches in either lung to suggest pulmonary embolism. Heart size upper normal. No visible coronary atherosclerosis. No pericardial effusion. No visible atherosclerosis involving the thoracic or upper abdominal aorta or their branches. Bovine aortic arch anatomy (left common carotid artery arises from the innominate artery).  Pulmonary parenchyma clear without localized airspace consolidation, interstitial disease, or parenchymal nodules or masses. Expected dependent  atelectasis posteriorly in the lower lobes. Central airways patent without significant bronchial wall thickening. No pleural effusions. Note made of an accessory fissure in the right lower lobe.  Moderate-sized hiatal hernia, unchanged since the prior examination. No significant mediastinal, hilar, or axillary lymphadenopathy. Visualized thyroid gland unremarkable.  Visualized upper abdomen unremarkable for the early arterial phase of enhancement. Bone window images unremarkable.  Review of the MIP images confirms the above findings.  IMPRESSION: 1. No evidence of pulmonary embolism. 2. No acute cardiopulmonary disease. 3. Stable moderate-sized hiatal hernia.    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 She has normal quantitative immunoglobulins, normal chemistries and normal CBC. The differential diagnosis was discussed with the patient today which include monoclonal gammopathy of undetermined significance, reactive monoclonal protein, less likely multiple myeloma or amyloidosis. She had multiple imaging studies as outlined above without any evidence to suggest skeletal metastasis. I see no evidence to suggest end organ damage and I see this be in extremely unlikely to be a plasma cell disorder.  From a management standpoint, I recommend repeating her protein studies in 6 months to document stability. If her protein studies are increasing, we will stage her with a skeletal survey and possibly a bone marrow biopsy.  2. Diffuse bone pain: I do not see any evidence to suggest lytic bone lesions. This is likely related to her arthritis, scoliosis possibly fibromyalgia. She follows up with her primary care physician as well as rheumatology.  All questions were answered today to his satisfaction.

## 2015-01-03 DIAGNOSIS — Z0289 Encounter for other administrative examinations: Secondary | ICD-10-CM

## 2015-04-17 NOTE — Telephone Encounter (Signed)
Error

## 2015-06-19 ENCOUNTER — Telehealth: Payer: Self-pay | Admitting: Oncology

## 2015-06-19 ENCOUNTER — Ambulatory Visit (HOSPITAL_BASED_OUTPATIENT_CLINIC_OR_DEPARTMENT_OTHER): Payer: Medicaid Other | Admitting: Oncology

## 2015-06-19 ENCOUNTER — Other Ambulatory Visit (HOSPITAL_BASED_OUTPATIENT_CLINIC_OR_DEPARTMENT_OTHER): Payer: Medicaid Other

## 2015-06-19 VITALS — BP 117/80 | HR 72 | Temp 98.6°F | Resp 18 | Ht 64.0 in | Wt 179.8 lb

## 2015-06-19 DIAGNOSIS — R29898 Other symptoms and signs involving the musculoskeletal system: Secondary | ICD-10-CM | POA: Diagnosis not present

## 2015-06-19 DIAGNOSIS — D472 Monoclonal gammopathy: Secondary | ICD-10-CM | POA: Diagnosis not present

## 2015-06-19 LAB — COMPREHENSIVE METABOLIC PANEL (CC13)
ALT: 9 U/L (ref 0–55)
AST: 17 U/L (ref 5–34)
Albumin: 3.9 g/dL (ref 3.5–5.0)
Alkaline Phosphatase: 85 U/L (ref 40–150)
Anion Gap: 6 mEq/L (ref 3–11)
BUN: 9 mg/dL (ref 7.0–26.0)
CHLORIDE: 106 meq/L (ref 98–109)
CO2: 29 meq/L (ref 22–29)
CREATININE: 0.8 mg/dL (ref 0.6–1.1)
Calcium: 9.2 mg/dL (ref 8.4–10.4)
EGFR: >90 mL/min/{1.73_m2} (ref >90)
Glucose: 91 mg/dl (ref 70–140)
Potassium: 4.4 mEq/L (ref 3.5–5.1)
Sodium: 141 mEq/L (ref 136–145)
Total Bilirubin: 0.61 mg/dL (ref 0.20–1.20)
Total Protein: 7.2 g/dL (ref 6.4–8.3)

## 2015-06-19 LAB — CBC WITH DIFFERENTIAL/PLATELET
BASO%: 0.4 % (ref 0.0–2.0)
Basophils Absolute: 0 10*3/uL (ref 0.0–0.1)
EOS%: 5.9 % (ref 0.0–7.0)
Eosinophils Absolute: 0.3 10*3/uL (ref 0.0–0.5)
HCT: 38.9 % (ref 34.8–46.6)
HGB: 12.8 g/dL (ref 11.6–15.9)
LYMPH%: 35 % (ref 14.0–49.7)
MCH: 28.8 pg (ref 25.1–34.0)
MCHC: 32.9 g/dL (ref 31.5–36.0)
MCV: 87.6 fL (ref 79.5–101.0)
MONO#: 0.4 10*3/uL (ref 0.1–0.9)
MONO%: 7.1 % (ref 0.0–14.0)
NEUT#: 2.6 10*3/uL (ref 1.5–6.5)
NEUT%: 51.6 % (ref 38.4–76.8)
Platelets: 245 10*3/uL (ref 145–400)
RBC: 4.44 10*6/uL (ref 3.70–5.45)
RDW: 13.7 % (ref 11.2–14.5)
WBC: 5.1 10*3/uL (ref 3.9–10.3)
lymph#: 1.8 10*3/uL (ref 0.9–3.3)

## 2015-06-19 NOTE — Telephone Encounter (Signed)
per pof to sch pt appt-gave pt copy of avs °

## 2015-06-19 NOTE — Progress Notes (Signed)
Hematology and Oncology Follow Up Visit  Kayla Mendoza [id number] 1976/05/03 39 y.o. 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 She has normal quantitative immunoglobulins without any evidence of end organ damage. The differential diagnosis include a plasma cell disorder versus reactive findings.   Current therapy: Observation and surveillance.  Interim History: Kayla Mendoza presents today for a follow-up visit. She is a pleasant woman I saw in consultation back in March 2016. Since our last visit, she was diagnosed with fibromyalgia and takes Cymbalta which have helped her symptoms. She does report arthralgias and myalgias and bursitis. She has not reported any constitutional symptoms or peripheral neuropathy. She has not reported any pathological fractures or recurrent infections.  She does not report any headaches, blurry vision, double vision or syncope or seizures. Her appetite had been reasonable. She does not report any fevers, chills or sweats. She does not report any lymphadenopathy or petechiae. She does not report any chest pain, shortness of breath, orthopnea or leg edema. Does not report any cough, wheezing or difficulty breathing. She does not report any nausea, vomiting she does report constipation but no diarrhea. She does not report any frequency, urgency or hesitancy. She has multiple skeletal issues as outlined above. She does not report any neuropathy or opportunistic infections. Rest of her review of systems unremarkable.   Medications: I have reviewed the patient's current medications.  Current Outpatient Prescriptions  Medication Sig Dispense Refill  . ALPRAZolam (XANAX) 1 MG tablet Take 1 mg by mouth 3 (three) times daily as needed for anxiety.    . docusate sodium (COLACE) 100 MG capsule Take 100 mg by mouth daily as needed for mild constipation.    .  DULoxetine (CYMBALTA) 60 MG capsule Take 60 mg by mouth daily.  1  . ibuprofen (ADVIL,MOTRIN) 200 MG tablet Take 600 mg by mouth every 6 (six) hours as needed for moderate pain.     . SEROQUEL XR 150 MG 24 hr tablet Take 150 mg by mouth daily.  1  . tiZANidine (ZANAFLEX) 2 MG tablet Take 4 mg by mouth 2 (two) times daily.    . valACYclovir (VALTREX) 1000 MG tablet Take 1,000 mg by mouth daily.     No current facility-administered medications for this visit.     Allergies:  Allergies  Allergen Reactions  . Neurontin [Gabapentin] Other (See Comments)    Caused chest pain  . Shellfish Allergy Hives  . Tramadol Itching and Rash    Past Medical History, Surgical history, Social history, and Family History were reviewed and updated.  Physical Exam: Blood pressure 117/80, pulse 72, temperature 98.6 F (37 C), temperature source Oral, resp. rate 18, height '5\' 4"'  (1.626 m), weight 179 lb 12.8 oz (81.557 kg), last menstrual period 03/28/2014, SpO2 100 %. ECOG: 0 General appearance: alert and cooperative Head: Normocephalic, without obvious abnormality Neck: no adenopathy Lymph nodes: Cervical, supraclavicular, and axillary nodes normal. Heart:regular rate and rhythm, S1, S2 normal, no murmur, click, rub or gallop Lung:chest clear, no wheezing, rales, normal symmetric air entry Abdomin: soft, non-tender, without masses or organomegaly EXT:no erythema, induration, or nodules   Lab Results: Lab Results  Component Value Date   WBC 5.1 06/19/2015   HGB 12.8 06/19/2015   HCT 38.9 06/19/2015   MCV 87.6 06/19/2015   PLT 245 06/19/2015     Chemistry      Component Value  Date/Time   NA 145 04/20/2014 1618   K 4.0 04/20/2014 1618   CL 105 04/20/2014 1618   CO2 31 04/20/2014 1618   BUN 5* 04/20/2014 1618   CREATININE 0.81 04/20/2014 1618      Component Value Date/Time   CALCIUM 8.6 04/20/2014 1618   ALKPHOS 69 04/16/2014 1358   AST 14 04/16/2014 1358   ALT 10 04/16/2014 1358    BILITOT 0.4 04/16/2014 1358       Radiological Studies: EXAM: CT ANGIOGRAPHY CHEST WITH CONTRAST  TECHNIQUE: Multidetector CT imaging of the chest was performed using the standard protocol during bolus administration of intravenous contrast. Multiplanar CT image reconstructions and MIPs were obtained to evaluate the vascular anatomy.  CONTRAST: 4m OMNIPAQUE IOHEXOL 350 MG/ML IV.  COMPARISON: CTA chest 07/24/2012.  FINDINGS: Contrast opacification of the pulmonary arteries is very good. No filling defects within either main pulmonary artery or their branches in either lung to suggest pulmonary embolism. Heart size upper normal. No visible coronary atherosclerosis. No pericardial effusion. No visible atherosclerosis involving the thoracic or upper abdominal aorta or their branches. Bovine aortic arch anatomy (left common carotid artery arises from the innominate artery).  Pulmonary parenchyma clear without localized airspace consolidation, interstitial disease, or parenchymal nodules or masses. Expected dependent atelectasis posteriorly in the lower lobes. Central airways patent without significant bronchial wall thickening. No pleural effusions. Note made of an accessory fissure in the right lower lobe.  Moderate-sized hiatal hernia, unchanged since the prior examination. No significant mediastinal, hilar, or axillary lymphadenopathy. Visualized thyroid gland unremarkable.  Visualized upper abdomen unremarkable for the early arterial phase of enhancement. Bone window images unremarkable.  Review of the MIP images confirms the above findings.  IMPRESSION: 1. No evidence of pulmonary embolism. 2. No acute cardiopulmonary disease. 3. Stable moderate-sized hiatal hernia.  IMPRESSION: Abnormal MRI scan lumbar spine showing mild degenerative changes throughout most severe at L2-3 where there is extra foraminal disc protrusion to the left resulting in possible  encroachment of the L2 nerve root. 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 She has normal quantitative immunoglobulins, normal chemistries and normal CBC. She had multiple imaging studies detailed above including CT scan and MRI without any evidence of bone disease.  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 For the time being we'll continue observation and surveillance and repeat protein studies in 12 months. If there is evidence of end organ damage, we will consider restaging her with imaging studies and a possible bone marrow biopsy.  2. Diffuse bone pain: I do not see any evidence to suggest lytic bone lesions. This is likely related to her arthritis, scoliosis possibly fibromyalgia. She is currently on Cymbalta which have helped her symptoms. I doubt this is related to plasma cell disorder at this time.   [facility] 9/1/201610:05 AM

## 2015-06-24 LAB — SPEP & IFE WITH QIG
ABNORMAL PROTEIN BAND1: 0.5 g/dL
Albumin ELP: 4 g/dL (ref 3.8–4.8)
Alpha-1-Globulin: 0.3 g/dL (ref 0.2–0.3)
Alpha-2-Globulin: 0.7 g/dL (ref 0.5–0.9)
Beta 2: 0.4 g/dL (ref 0.2–0.5)
Beta Globulin: 0.4 g/dL (ref 0.4–0.6)
Gamma Globulin: 1.3 g/dL (ref 0.8–1.7)
IGA: 144 mg/dL (ref 69–380)
IGG (IMMUNOGLOBIN G), SERUM: 1480 mg/dL (ref 690–1700)
IgM, Serum: 108 mg/dL (ref 52–322)
TOTAL PROTEIN, SERUM ELECTROPHOR: 7 g/dL (ref 6.1–8.1)

## 2015-06-24 LAB — KAPPA/LAMBDA LIGHT CHAINS
KAPPA FREE LGHT CHN: 1.89 mg/dL (ref 0.33–1.94)
Kappa:Lambda Ratio: 1.37 (ref 0.26–1.65)
LAMBDA FREE LGHT CHN: 1.38 mg/dL (ref 0.57–2.63)

## 2015-08-13 ENCOUNTER — Emergency Department (HOSPITAL_BASED_OUTPATIENT_CLINIC_OR_DEPARTMENT_OTHER)
Admission: EM | Admit: 2015-08-13 | Discharge: 2015-08-13 | Disposition: A | Discharge status: Home / Self Care | Payer: Medicaid Other | Attending: Emergency Medicine | Admitting: Emergency Medicine

## 2015-08-13 ENCOUNTER — Encounter (HOSPITAL_BASED_OUTPATIENT_CLINIC_OR_DEPARTMENT_OTHER): Payer: Self-pay | Admitting: Emergency Medicine

## 2015-08-13 DIAGNOSIS — K219 Gastro-esophageal reflux disease without esophagitis: Secondary | ICD-10-CM | POA: Diagnosis not present

## 2015-08-13 DIAGNOSIS — R519 Headache, unspecified: Secondary | ICD-10-CM

## 2015-08-13 DIAGNOSIS — Z8679 Personal history of other diseases of the circulatory system: Secondary | ICD-10-CM | POA: Diagnosis not present

## 2015-08-13 DIAGNOSIS — Z8669 Personal history of other diseases of the nervous system and sense organs: Secondary | ICD-10-CM | POA: Insufficient documentation

## 2015-08-13 DIAGNOSIS — R51 Headache: Secondary | ICD-10-CM | POA: Diagnosis present

## 2015-08-13 DIAGNOSIS — Z87448 Personal history of other diseases of urinary system: Secondary | ICD-10-CM | POA: Insufficient documentation

## 2015-08-13 DIAGNOSIS — Z8742 Personal history of other diseases of the female genital tract: Secondary | ICD-10-CM | POA: Insufficient documentation

## 2015-08-13 DIAGNOSIS — M419 Scoliosis, unspecified: Secondary | ICD-10-CM | POA: Insufficient documentation

## 2015-08-13 DIAGNOSIS — Z79899 Other long term (current) drug therapy: Secondary | ICD-10-CM | POA: Diagnosis not present

## 2015-08-13 DIAGNOSIS — Z791 Long term (current) use of non-steroidal anti-inflammatories (NSAID): Secondary | ICD-10-CM | POA: Diagnosis not present

## 2015-08-13 DIAGNOSIS — F419 Anxiety disorder, unspecified: Secondary | ICD-10-CM | POA: Diagnosis not present

## 2015-08-13 DIAGNOSIS — F329 Major depressive disorder, single episode, unspecified: Secondary | ICD-10-CM | POA: Diagnosis not present

## 2015-08-13 HISTORY — DX: Other intervertebral disc degeneration, lumbar region: M51.36

## 2015-08-13 HISTORY — DX: Scoliosis, unspecified: M41.9

## 2015-08-13 HISTORY — DX: Other intervertebral disc degeneration, lumbar region without mention of lumbar back pain or lower extremity pain: M51.369

## 2015-08-13 HISTORY — DX: Fibromyalgia: M79.7

## 2015-08-13 HISTORY — DX: Migraine, unspecified, not intractable, without status migrainosus: G43.909

## 2015-08-13 MED ORDER — LORATADINE-PSEUDOEPHEDRINE ER 5-120 MG PO TB12: 1.0000 | ORAL_TABLET | Freq: Two times a day (BID) | ORAL | Status: DC

## 2015-08-13 MED ORDER — FLUTICASONE PROPIONATE 50 MCG/ACT NA SUSP: 1.0000 | Freq: Every day | NASAL | Status: DC

## 2015-08-13 MED ORDER — DIPHENHYDRAMINE HCL 50 MG/ML IJ SOLN
25.0000 mg | Freq: Once | INTRAMUSCULAR | Status: AC
  Administered 2015-08-13: 25 mg via INTRAMUSCULAR
  Filled 2015-08-13: qty 1

## 2015-08-13 MED ORDER — IBUPROFEN 600 MG PO TABS: 600.0000 mg | ORAL_TABLET | Freq: Four times a day (QID) | ORAL | Status: DC | PRN

## 2015-08-13 MED ORDER — KETOROLAC TROMETHAMINE 60 MG/2ML IM SOLN
60.0000 mg | Freq: Once | INTRAMUSCULAR | Status: AC
  Administered 2015-08-13: 60 mg via INTRAMUSCULAR
  Filled 2015-08-13: qty 2

## 2015-08-13 MED ORDER — METOCLOPRAMIDE HCL 5 MG/ML IJ SOLN
10.0000 mg | Freq: Once | INTRAMUSCULAR | Status: AC
  Administered 2015-08-13: 10 mg via INTRAMUSCULAR
  Filled 2015-08-13: qty 2

## 2015-08-13 NOTE — Discharge Instructions (Signed)
General Headache Without Cause A headache is pain or discomfort felt around the head or neck area. The specific cause of a headache may not be found. There are many causes and types of headaches. A few common ones are:  Tension headaches.  Migraine headaches.  Cluster headaches.  Chronic daily headaches. HOME CARE INSTRUCTIONS  Watch your condition for any changes. Take these steps to help with your condition: Managing Pain  Take over-the-counter and prescription medicines only as told by your health care provider.  Lie down in a dark, quiet room when you have a headache.  If directed, apply ice to the head and neck area:  Put ice in a plastic bag.  Place a towel between your skin and the bag.  Leave the ice on for 20 minutes, 2-3 times per day.  Use a heating pad or hot shower to apply heat to the head and neck area as told by your health care provider.  Keep lights dim if bright lights bother you or make your headaches worse. Eating and Drinking  Eat meals on a regular schedule.  Limit alcohol use.  Decrease the amount of caffeine you drink, or stop drinking caffeine. General Instructions  Keep all follow-up visits as told by your health care provider. This is important.  Keep a headache journal to help find out what may trigger your headaches. For example, write down:  What you eat and drink.  How much sleep you get.  Any change to your diet or medicines.  Try massage or other relaxation techniques.  Limit stress.  Sit up straight, and do not tense your muscles.  Do not use tobacco products, including cigarettes, chewing tobacco, or e-cigarettes. If you need help quitting, ask your health care provider.  Exercise regularly as told by your health care provider.  Sleep on a regular schedule. Get 7-9 hours of sleep, or the amount recommended by your health care provider. SEEK MEDICAL CARE IF:   Your symptoms are not helped by medicine.  You have a  headache that is different from the usual headache.  You have nausea or you vomit.  You have a fever. SEEK IMMEDIATE MEDICAL CARE IF:   Your headache becomes severe.  You have repeated vomiting.  You have a stiff neck.  You have a loss of vision.  You have problems with speech.  You have pain in the eye or ear.  You have muscular weakness or loss of muscle control.  You lose your balance or have trouble walking.  You feel faint or pass out.  You have confusion.   This information is not intended to replace advice given to you by your health care provider. Make sure you discuss any questions you have with your health care provider.   Document Released: 10/04/2005 Document Revised: 06/25/2015 Document Reviewed: 01/27/2015 Elsevier Interactive Patient Education 2016 [employer] Suspected Sinus Headache A sinus headache occurs when the paranasal sinuses become clogged or swollen. Paranasal sinuses are air pockets within the bones of the face. Sinus headaches can range from mild to severe. CAUSES A sinus headache can result from various conditions that affect the sinuses, such as:  Colds.  Sinus infections.  Allergies. SYMPTOMS The main symptom of this condition is a headache that may feel like pain or pressure in the face, forehead, ears, or upper teeth. People who have a sinus headache often have other symptoms, such as:  Congested or runny nose.  Fever.  Inability to smell. Weather changes can make  symptoms worse. DIAGNOSIS This condition may be diagnosed based on:  A physical exam and medical history.  Imaging tests, such as a CT scan and MRI, to check for problems with the sinuses.  A specialist may look into the sinuses with a tool that has a camera (endoscopy). TREATMENT Treatment for this condition depends on the cause.  Sinus pain that is caused by a sinus infection may be treated with antibiotic medicine.  Sinus pain that is caused by allergies  may be helped by allergy medicines (antihistamines) and medicated nasal sprays.  Sinus pain that is caused by congestion may be helped by flushing the nose and sinuses with saline solution. HOME CARE INSTRUCTIONS  Take medicines only as directed by your health care provider.  If you were prescribed an antibiotic medicine, finish all of it even if you start to feel better.  If you have congestion, use a nasal spray to help reduce pressure.  If directed, apply a warm, moist washcloth to your face to help relieve pain. SEEK MEDICAL CARE IF:  You have headaches more than one time each week.  You have sensitivity to light or sound.  You have a fever.  You feel sick to your stomach (nauseous) or you throw up (vomit).  Your headaches do not get better with treatment. Many people think that they have a sinus headache when they actually have migraines or tension headaches. SEEK IMMEDIATE MEDICAL CARE IF:  You have vision problems.  You have sudden, severe pain in your face or head.  You have a seizure.  You are confused.  You have a stiff neck.   This information is not intended to replace advice given to you by your health care provider. Make sure you discuss any questions you have with your health care provider.   Document Released: 11/11/2004 Document Revised: 02/18/2015 Document Reviewed: 09/30/2014 Elsevier Interactive Patient Education [employer].

## 2015-08-13 NOTE — ED Provider Notes (Signed)
CSN: [id number]     Arrival date & time 08/13/15  1705 History   First MD Initiated Contact with Patient 08/13/15 1730     Chief Complaint  Patient presents with  . Headache     (Consider location/radiation/quality/duration/timing/severity/associated sxs/prior Treatment) HPI Patient reports she's had a headache for about a week. It's frontal and sometimes at the back of her head. She reports she awakens in the morning with a frontal headache and pressure sensation. She reports symptoms are worse when she turns her head in certain directions. She then experiences a rushing kind of water sound in her left ear and fullness. She reports she gets more pressure behind her eyes if she tilts her head forward. Maximum temperature that she's measured has been 100. She reports she does get some aching on the tops of her shoulders and the base of her neck. She states she used to have migraines about 2 years ago. The headaches have not been very bad recently however. There is been no imbalance, no gait instability, and no focal weakness numbness or tingling, no visual dysfunction or photophobia. Past Medical History  Diagnosis Date  . GERD (gastroesophageal reflux disease)   . Anxiety   . Bladder neoplasm   . Urgency of urination   . Pelvic pain in female   . Abnormal uterine bleeding (AUB)   . Arthritis of back   . Pinched vertebral nerve   . Depression   . RLS (restless legs syndrome)   . Nocturia   . H/O hiatal hernia   . Fibromyalgia   . Scoliosis   . DDD (degenerative disc disease), lumbar   . Migraines    Past Surgical History  Procedure Laterality Date  . Essure tubal ligation  2010  . Negative sleep study  2013  . Cystoscopy with biopsy N/A 03/08/2014    Procedure: CYSTOSCOPY WITH  TURBT SMALL HYDROTENSION OF BLADDER INSTALLATION OF MARCAINE AND  PYRIDIUM;  Surgeon: [name], MD;  Location: [facility];  Service: Urology;  Laterality: N/A;  . Laparoscopic  assisted vaginal hysterectomy Bilateral 04/17/2014    Procedure: LAPAROSCOPIC ASSISTED VAGINAL HYSTERECTOMY, Bilateral Salpingectomy;  Surgeon: [name], MD;  Location: [facility];  Service: Gynecology;  Laterality: Bilateral;  clean/contaminated  . Cystoscopy N/A 04/17/2014    Procedure: CYSTOSCOPY;  Surgeon: [name], MD;  Location: [facility];  Service: Gynecology;  Laterality: N/A;  clean/contaminated  . Abdominal hysterectomy     Family History  Problem Relation Age of Onset  . Diabetes Paternal Grandmother   . Diabetes Paternal Grandfather   . Hypertension Maternal Grandfather    Social History  Substance Use Topics  . Smoking status: Never Smoker   . Smokeless tobacco: Never Used  . Alcohol Use: No   OB History    No data available     Review of Systems  10 Systems reviewed and are negative for acute change except as noted in the HPI.   Allergies  Neurontin; Shellfish allergy; and Tramadol  Home Medications   Prior to Admission medications   Medication Sig Start Date End Date Taking? Authorizing Provider  diclofenac sodium (VOLTAREN) 1 % GEL Apply topically 4 (four) times daily.   Yes Historical Provider, MD  omeprazole (PRILOSEC) 40 MG capsule Take 40 mg by mouth 2 (two) times daily.   Yes Historical Provider, MD  ALPRAZolam Duanne Moron) 1 MG tablet Take 1 mg by mouth 3 (three) times daily as needed for anxiety.    Historical Provider, MD  docusate sodium (COLACE) 100 MG capsule Take 100 mg by mouth daily as needed for mild constipation.    Historical Provider, MD  DULoxetine (CYMBALTA) 60 MG capsule Take 60 mg by mouth daily. 06/02/15   Historical Provider, MD  fluticasone (FLONASE) 50 MCG/ACT nasal spray Place 1 spray into both nostrils daily. 08/13/15   Charlesetta Shanks, MD  ibuprofen (ADVIL,MOTRIN) 200 MG tablet Take 600 mg by mouth every 6 (six) hours as needed for moderate pain.     Historical Provider, MD  ibuprofen (ADVIL,MOTRIN) 600 MG tablet Take 1 tablet (600 mg  total) by mouth every 6 (six) hours as needed. 08/13/15   Charlesetta Shanks, MD  loratadine-pseudoephedrine (CLARITIN-D 12 HOUR) 5-120 MG tablet Take 1 tablet by mouth 2 (two) times daily. 08/13/15   Charlesetta Shanks, MD  SEROQUEL XR 150 MG 24 hr tablet Take 150 mg by mouth daily. 05/05/15   Historical Provider, MD  tiZANidine (ZANAFLEX) 2 MG tablet Take 4 mg by mouth 2 (two) times daily. 06/28/13   Dennie Bible, NP  valACYclovir (VALTREX) 1000 MG tablet Take 1,000 mg by mouth daily.    Historical Provider, MD   BP 137/88 mmHg  Pulse 104  Temp(Src) 99.6 F (37.6 C) (Oral)  Resp 16  Ht 5\' 4"  (1.626 m)  Wt 188 lb (85.276 kg)  BMI 32.25 kg/m2  SpO2 99%  LMP 03/28/2014 Physical Exam  Constitutional: She is oriented to person, place, and time. She appears well-developed and well-nourished.  HENT:  Head: Normocephalic and atraumatic.  Bilateral TMs are normal. Nasal passages clear. No percussion and is over frontal sinuses. Dentition is in very good condition. No facial asymmetry or swelling. No cervical lymphadenopathy. Posterior or first widely patent.  Eyes: EOM are normal. Pupils are equal, round, and reactive to light.  Neck: Neck supple.  Cardiovascular: Normal rate, regular rhythm, normal heart sounds and intact distal pulses.   Pulmonary/Chest: Effort normal and breath sounds normal.  Abdominal: Soft. Bowel sounds are normal. She exhibits no distension. There is no tenderness.  Musculoskeletal: Normal range of motion. She exhibits no edema.  Neurological: She is alert and oriented to person, place, and time. She has normal strength. No cranial nerve deficit. She exhibits normal muscle tone. Coordination normal. GCS eye subscore is 4. GCS verbal subscore is 5. GCS motor subscore is 6.  Skin: Skin is warm, dry and intact.  Psychiatric: She has a normal mood and affect.    ED Course  Procedures (including critical care time) Labs Review Labs Reviewed - No data to display  Imaging  Review No results found. I have personally reviewed and evaluated these images and lab results as part of my medical decision-making.   EKG Interpretation None      MDM   Final diagnoses:  Acute nonintractable headache, unspecified headache type   Patient is well appearance. She's been getting morning headaches that her pressure and tight quality across her forehead. She does have some quality of tinnitus or ear fullness in association with this. No associated neurologic dysfunction. No visual dysfunction. At this time with pressure type headache that is worse with position and has been associated auditory sound with head movement and year pressure, I feel this is most likely a sinus pressure headache. The patient does not have symptoms of bacterial sinusitis. She does not have percussion tenderness, facial swelling, general constitutional symptoms. Gen. treatment will be initiated and the patient is to follow-up with her doctor. Patient also reports a migraine history  however this time with the headache waxing and waning and is positional nature, also is more likely to be of a likely sinus etiology. Patient was given a migraine cocktail emergency department for acute pain control.    Charlesetta Shanks, MD 08/13/15 [id number]

## 2015-08-13 NOTE — ED Notes (Signed)
Headache for a few days.  Worse today.  Chills.  Feeling of fluid in left ear x1 week.

## 2015-11-08 ENCOUNTER — Emergency Department (HOSPITAL_BASED_OUTPATIENT_CLINIC_OR_DEPARTMENT_OTHER)
Admission: EM | Admit: 2015-11-08 | Discharge: 2015-11-08 | Disposition: A | Discharge status: Home / Self Care | Payer: Medicaid Other | Attending: Emergency Medicine | Admitting: Emergency Medicine

## 2015-11-08 ENCOUNTER — Emergency Department (HOSPITAL_BASED_OUTPATIENT_CLINIC_OR_DEPARTMENT_OTHER): Payer: Medicaid Other

## 2015-11-08 ENCOUNTER — Encounter (HOSPITAL_BASED_OUTPATIENT_CLINIC_OR_DEPARTMENT_OTHER): Payer: Self-pay | Admitting: *Deleted

## 2015-11-08 DIAGNOSIS — Y9302 Activity, running: Secondary | ICD-10-CM | POA: Insufficient documentation

## 2015-11-08 DIAGNOSIS — M419 Scoliosis, unspecified: Secondary | ICD-10-CM | POA: Diagnosis not present

## 2015-11-08 DIAGNOSIS — Z8669 Personal history of other diseases of the nervous system and sense organs: Secondary | ICD-10-CM | POA: Diagnosis not present

## 2015-11-08 DIAGNOSIS — S93401A Sprain of unspecified ligament of right ankle, initial encounter: Secondary | ICD-10-CM | POA: Insufficient documentation

## 2015-11-08 DIAGNOSIS — F329 Major depressive disorder, single episode, unspecified: Secondary | ICD-10-CM | POA: Insufficient documentation

## 2015-11-08 DIAGNOSIS — Y9289 Other specified places as the place of occurrence of the external cause: Secondary | ICD-10-CM | POA: Insufficient documentation

## 2015-11-08 DIAGNOSIS — K219 Gastro-esophageal reflux disease without esophagitis: Secondary | ICD-10-CM | POA: Diagnosis not present

## 2015-11-08 DIAGNOSIS — M797 Fibromyalgia: Secondary | ICD-10-CM | POA: Diagnosis not present

## 2015-11-08 DIAGNOSIS — Z7951 Long term (current) use of inhaled steroids: Secondary | ICD-10-CM | POA: Diagnosis not present

## 2015-11-08 DIAGNOSIS — G8929 Other chronic pain: Secondary | ICD-10-CM | POA: Insufficient documentation

## 2015-11-08 DIAGNOSIS — Z79899 Other long term (current) drug therapy: Secondary | ICD-10-CM | POA: Diagnosis not present

## 2015-11-08 DIAGNOSIS — Z8679 Personal history of other diseases of the circulatory system: Secondary | ICD-10-CM | POA: Diagnosis not present

## 2015-11-08 DIAGNOSIS — M1990 Unspecified osteoarthritis, unspecified site: Secondary | ICD-10-CM | POA: Insufficient documentation

## 2015-11-08 DIAGNOSIS — Z8551 Personal history of malignant neoplasm of bladder: Secondary | ICD-10-CM | POA: Insufficient documentation

## 2015-11-08 DIAGNOSIS — Y998 Other external cause status: Secondary | ICD-10-CM | POA: Insufficient documentation

## 2015-11-08 DIAGNOSIS — S99911A Unspecified injury of right ankle, initial encounter: Secondary | ICD-10-CM | POA: Diagnosis present

## 2015-11-08 DIAGNOSIS — F419 Anxiety disorder, unspecified: Secondary | ICD-10-CM | POA: Insufficient documentation

## 2015-11-08 DIAGNOSIS — W1839XA Other fall on same level, initial encounter: Secondary | ICD-10-CM | POA: Diagnosis not present

## 2015-11-08 MED ORDER — IBUPROFEN 800 MG PO TABS
800.0000 mg | ORAL_TABLET | Freq: Once | ORAL | Status: AC
  Administered 2015-11-08: 800 mg via ORAL
  Filled 2015-11-08: qty 1

## 2015-11-08 MED ORDER — IBUPROFEN 800 MG PO TABS: 800.0000 mg | ORAL_TABLET | Freq: Three times a day (TID) | ORAL | Status: DC

## 2015-11-08 NOTE — Discharge Instructions (Signed)
1. Medications: alternate ibuprofen and tylenol for pain control, usual home medications 2. Treatment: rest, ice, elevate and use brace, drink plenty of fluids, gentle stretching 3. Follow Up: Please followup with orthopedics as directed or your PCP in 1 week if no improvement for discussion of your diagnoses and further evaluation after today's visit; if you do not have a primary care doctor use the resource guide provided to find one; Please return to the ER for worsening symptoms or other concerns    Ankle Sprain An ankle sprain is an injury to the strong, fibrous tissues (ligaments) that hold the bones of your ankle joint together.  CAUSES An ankle sprain is usually caused by a fall or by twisting your ankle. Ankle sprains most commonly occur when you step on the outer edge of your foot, and your ankle turns inward. People who participate in sports are more prone to these types of injuries.  SYMPTOMS   Pain in your ankle. The pain may be present at rest or only when you are trying to stand or walk.  Swelling.  Bruising. Bruising may develop immediately or within 1 to 2 days after your injury.  Difficulty standing or walking, particularly when turning corners or changing directions. DIAGNOSIS  Your caregiver will ask you details about your injury and perform a physical exam of your ankle to determine if you have an ankle sprain. During the physical exam, your caregiver will press on and apply pressure to specific areas of your foot and ankle. Your caregiver will try to move your ankle in certain ways. An X-ray exam may be done to be sure a bone was not broken or a ligament did not separate from one of the bones in your ankle (avulsion fracture).  TREATMENT  Certain types of braces can help stabilize your ankle. Your caregiver can make a recommendation for this. Your caregiver may recommend the use of medicine for pain. If your sprain is severe, your caregiver may refer you to a surgeon who  helps to restore function to parts of your skeletal system (orthopedist) or a physical therapist. Bland ice to your injury for 1-2 days or as directed by your caregiver. Applying ice helps to reduce inflammation and pain.  Put ice in a plastic bag.  Place a towel between your skin and the bag.  Leave the ice on for 15-20 minutes at a time, every 2 hours while you are awake.  Only take over-the-counter or prescription medicines for pain, discomfort, or fever as directed by your caregiver.  Elevate your injured ankle above the level of your heart as much as possible for 2-3 days.  If your caregiver recommends crutches, use them as instructed. Gradually put weight on the affected ankle. Continue to use crutches or a cane until you can walk without feeling pain in your ankle.  If you have a plaster splint, wear the splint as directed by your caregiver. Do not rest it on anything harder than a pillow for the first 24 hours. Do not put weight on it. Do not get it wet. You may take it off to take a shower or bath.  You may have been given an elastic bandage to wear around your ankle to provide support. If the elastic bandage is too tight (you have numbness or tingling in your foot or your foot becomes cold and blue), adjust the bandage to make it comfortable.  If you have an air splint, you may blow more  air into it or let air out to make it more comfortable. You may take your splint off at night and before taking a shower or bath. Wiggle your toes in the splint several times per day to decrease swelling. SEEK MEDICAL CARE IF:   You have rapidly increasing bruising or swelling.  Your toes feel extremely cold or you lose feeling in your foot.  Your pain is not relieved with medicine. SEEK IMMEDIATE MEDICAL CARE IF:  Your toes are numb or blue.  You have severe pain that is increasing. MAKE SURE YOU:   Understand these instructions.  Will watch your  condition.  Will get help right away if you are not doing well or get worse.   This information is not intended to replace advice given to you by your health care provider. Make sure you discuss any questions you have with your health care provider.   Document Released: 10/04/2005 Document Revised: 10/25/2014 Document Reviewed: 10/16/2011 Elsevier Interactive Patient Education [employer].

## 2015-11-08 NOTE — ED Provider Notes (Signed)
CSN: [id number]     Arrival date & time 11/08/15  1150 History   First MD Initiated Contact with Patient 11/08/15 1252     Chief Complaint  Patient presents with  . Ankle Pain     (Consider location/radiation/quality/duration/timing/severity/associated sxs/prior Treatment) The history is provided by the patient and medical records. No language interpreter was used.     Kayla Mendoza is a 40 y.o. female  with a hx of GERD, anxiety, chronic low back pain presents to the Emergency Department complaining of gradual, persistent, progressively worsening right ankle pain onset last night after a stumble and fall.  She did not hit her head or have an LOC.  Pt was ambulatory without difficulty afterwards, but pain was worse this morning.  Pt reports ice usage last night, but no other treatments PTA.  Associated symptoms include swelling without ecchymosis.  Nothing makes it better and walking and palpation makes it worse.  Pt denies fever, chills, weakness, numbness, hx of gout or recent illness.     Past Medical History  Diagnosis Date  . GERD (gastroesophageal reflux disease)   . Anxiety   . Bladder neoplasm   . Urgency of urination   . Pelvic pain in female   . Abnormal uterine bleeding (AUB)   . Arthritis of back   . Pinched vertebral nerve   . Depression   . RLS (restless legs syndrome)   . Nocturia   . H/O hiatal hernia   . Fibromyalgia   . Scoliosis   . DDD (degenerative disc disease), lumbar   . Migraines    Past Surgical History  Procedure Laterality Date  . Essure tubal ligation  2010  . Negative sleep study  2013  . Cystoscopy with biopsy N/A 03/08/2014    Procedure: CYSTOSCOPY WITH  TURBT SMALL HYDROTENSION OF BLADDER INSTALLATION OF MARCAINE AND  PYRIDIUM;  Surgeon: [name], MD;  Location: [facility];  Service: Urology;  Laterality: N/A;  . Laparoscopic assisted vaginal hysterectomy Bilateral 04/17/2014    Procedure: LAPAROSCOPIC ASSISTED VAGINAL  HYSTERECTOMY, Bilateral Salpingectomy;  Surgeon: [name], MD;  Location: [facility];  Service: Gynecology;  Laterality: Bilateral;  clean/contaminated  . Cystoscopy N/A 04/17/2014    Procedure: CYSTOSCOPY;  Surgeon: [name], MD;  Location: [facility];  Service: Gynecology;  Laterality: N/A;  clean/contaminated  . Abdominal hysterectomy     Family History  Problem Relation Age of Onset  . Diabetes Paternal Grandmother   . Diabetes Paternal Grandfather   . Hypertension Maternal Grandfather    Social History  Substance Use Topics  . Smoking status: Never Smoker   . Smokeless tobacco: Never Used  . Alcohol Use: No   OB History    No data available     Review of Systems  Constitutional: Negative for fever and chills.  Gastrointestinal: Negative for nausea and vomiting.  Musculoskeletal: Positive for joint swelling and arthralgias. Negative for back pain, neck pain and neck stiffness.  Skin: Negative for wound.  Neurological: Negative for numbness.  Hematological: Does not bruise/bleed easily.  Psychiatric/Behavioral: The patient is not nervous/anxious.   All other systems reviewed and are negative.     Allergies  Neurontin; Shellfish allergy; and Tramadol  Home Medications   Prior to Admission medications   Medication Sig Start Date End Date Taking? Authorizing Provider  ALPRAZolam Duanne Moron) 1 MG tablet Take 1 mg by mouth 3 (three) times daily as needed for anxiety.   Yes Historical Provider, MD  diclofenac sodium (  VOLTAREN) 1 % GEL Apply topically 4 (four) times daily.   Yes Historical Provider, MD  DULoxetine (CYMBALTA) 60 MG capsule Take 60 mg by mouth daily. 06/02/15  Yes Historical Provider, MD  omeprazole (PRILOSEC) 40 MG capsule Take 40 mg by mouth 2 (two) times daily.   Yes Historical Provider, MD  SEROQUEL XR 150 MG 24 hr tablet Take 150 mg by mouth daily. 05/05/15  Yes Historical Provider, MD  tiZANidine (ZANAFLEX) 2 MG tablet Take 4 mg by mouth 2 (two) times daily.  06/28/13  Yes Dennie Bible, NP  valACYclovir (VALTREX) 1000 MG tablet Take 1,000 mg by mouth daily.   Yes Historical Provider, MD  docusate sodium (COLACE) 100 MG capsule Take 100 mg by mouth daily as needed for mild constipation.    Historical Provider, MD  fluticasone (FLONASE) 50 MCG/ACT nasal spray Place 1 spray into both nostrils daily. 08/13/15   Charlesetta Shanks, MD  ibuprofen (ADVIL,MOTRIN) 800 MG tablet Take 1 tablet (800 mg total) by mouth 3 (three) times daily. 11/08/15   Selma Mink, PA-C  loratadine-pseudoephedrine (CLARITIN-D 12 HOUR) 5-120 MG tablet Take 1 tablet by mouth 2 (two) times daily. 08/13/15   Charlesetta Shanks, MD   BP 137/92 mmHg  Pulse 104  Temp(Src) 98.1 F (36.7 C) (Oral)  Resp 18  Ht 5\' 4"  (1.626 m)  Wt 92.987 kg  BMI 35.17 kg/m2  SpO2 100%  LMP 03/28/2014 Physical Exam  Constitutional: She appears well-developed and well-nourished. No distress.  HENT:  Head: Normocephalic and atraumatic.  Eyes: Conjunctivae are normal.  Neck: Normal range of motion.  Cardiovascular: Normal rate, regular rhythm and intact distal pulses.   Capillary refill < 3 sec  Pulmonary/Chest: Effort normal and breath sounds normal.  Musculoskeletal: She exhibits tenderness. She exhibits no edema.   Right ankle: tenderness to palpation over the lateral malleolus with mild swelling but no significant ecchymosis , no open wounds, no deformities , no erythema , induration or increased warmth, full range of motion   full range of motion of the right knee and all toes of the right foot  Neurological: She is alert. Coordination normal.  Sensation intact in the RLE Strength 5/5 in the RLE including dorsiflexion and plantar flexion  Skin: Skin is warm and dry. She is not diaphoretic.  No tenting of the skin  Psychiatric: She has a normal mood and affect.  Nursing note and vitals reviewed.   ED Course  Procedures (including critical care time)  Imaging Review Dg Ankle  Complete Right  11/08/2015  CLINICAL DATA:  40 year old female with right lateral ankle pain after tripping and falling. Pain with ambulation. EXAM: RIGHT ANKLE - COMPLETE 3+ VIEW COMPARISON:  None. FINDINGS: There is no evidence of fracture, dislocation, or joint effusion. There is no evidence of arthropathy or other focal bone abnormality. Soft tissue swelling overlies the lateral malleolus. IMPRESSION: Soft tissue swelling overlying the lateral malleolus without evidence of underlying fracture or malalignment. Electronically Signed   By: Jacqulynn Cadet M.D.   On: 11/08/2015 13:29   I have personally reviewed and evaluated these images and lab results as part of my medical decision-making.    MDM   Final diagnoses:  Right ankle sprain, initial encounter   SHERRIL ZABLOCKI presents with right ankle pain.  Patient X-Ray negative for obvious fracture or dislocation. Pain managed in ED. Pt advised to follow up with orthopedics if symptoms persist for possibility of missed fracture diagnosis. Patient given brace while in ED,  conservative therapy recommended and discussed. Patient will be dc home & is agreeable with above plan.    Jarrett Soho Lyell Clugston, PA-C 11/08/15 [place], MD 11/09/15 [phone]

## 2015-11-08 NOTE — ED Notes (Signed)
Reports running last night. States "someone stepped on my foot and i fell". Ambulates with limp. Swelling noted

## 2016-06-18 ENCOUNTER — Telehealth: Payer: Self-pay | Admitting: Oncology

## 2016-06-18 ENCOUNTER — Ambulatory Visit (HOSPITAL_BASED_OUTPATIENT_CLINIC_OR_DEPARTMENT_OTHER): Payer: Medicaid Other | Admitting: Oncology

## 2016-06-18 ENCOUNTER — Other Ambulatory Visit (HOSPITAL_BASED_OUTPATIENT_CLINIC_OR_DEPARTMENT_OTHER): Payer: Medicaid Other

## 2016-06-18 VITALS — BP 126/78 | HR 77 | Temp 98.1°F | Resp 16 | Ht 64.0 in | Wt 216.6 lb

## 2016-06-18 DIAGNOSIS — D472 Monoclonal gammopathy: Secondary | ICD-10-CM | POA: Diagnosis present

## 2016-06-18 DIAGNOSIS — M797 Fibromyalgia: Secondary | ICD-10-CM

## 2016-06-18 LAB — COMPREHENSIVE METABOLIC PANEL
ALBUMIN: 3.5 g/dL (ref 3.5–5.0)
ALK PHOS: 117 U/L (ref 40–150)
ALT: 11 U/L (ref 0–55)
ANION GAP: 10 meq/L (ref 3–11)
AST: 20 U/L (ref 5–34)
BILIRUBIN TOTAL: 0.6 mg/dL (ref 0.20–1.20)
BUN: 5.9 mg/dL — ABNORMAL LOW (ref 7.0–26.0)
CALCIUM: 9.4 mg/dL (ref 8.4–10.4)
CO2: 27 meq/L (ref 22–29)
CREATININE: 0.9 mg/dL (ref 0.6–1.1)
Chloride: 106 mEq/L (ref 98–109)
Glucose: 100 mg/dl (ref 70–140)
Potassium: 4.1 mEq/L (ref 3.5–5.1)
Sodium: 143 mEq/L (ref 136–145)
TOTAL PROTEIN: 7.8 g/dL (ref 6.4–8.3)

## 2016-06-18 LAB — CBC WITH DIFFERENTIAL/PLATELET
BASO%: 0.6 % (ref 0.0–2.0)
Basophils Absolute: 0 10*3/uL (ref 0.0–0.1)
EOS%: 5.3 % (ref 0.0–7.0)
Eosinophils Absolute: 0.3 10*3/uL (ref 0.0–0.5)
HEMATOCRIT: 39.5 % (ref 34.8–46.6)
HEMOGLOBIN: 12.8 g/dL (ref 11.6–15.9)
LYMPH#: 2 10*3/uL (ref 0.9–3.3)
LYMPH%: 32.7 % (ref 14.0–49.7)
MCH: 28 pg (ref 25.1–34.0)
MCHC: 32.5 g/dL (ref 31.5–36.0)
MCV: 86 fL (ref 79.5–101.0)
MONO#: 0.4 10*3/uL (ref 0.1–0.9)
MONO%: 6.6 % (ref 0.0–14.0)
NEUT%: 54.8 % (ref 38.4–76.8)
NEUTROS ABS: 3.3 10*3/uL (ref 1.5–6.5)
Platelets: 314 10*3/uL (ref 145–400)
RBC: 4.59 10*6/uL (ref 3.70–5.45)
RDW: 14.6 % — ABNORMAL HIGH (ref 11.2–14.5)
WBC: 6 10*3/uL (ref 3.9–10.3)

## 2016-06-18 NOTE — Telephone Encounter (Signed)
Gave patient avs report and appointments for August 2018 °

## 2016-06-18 NOTE — Progress Notes (Signed)
Hematology and Oncology Follow Up Visit  Kayla Mendoza [id number] Mar 29, 1976 40 y.o. 06/18/2016 9:32 AM   Principle Diagnosis: 40 year old woman with a monoclonal protein detected by immunofixation with an M spike of 0.37 g/dL IgG kappa subtype. She has normal quantitative immunoglobulins without any evidence of end organ damage. The differential diagnosis include a plasma cell disorder versus reactive findings.   Current therapy: Observation and surveillance.  Interim History: Kayla Mendoza presents today for a follow-up visit. Since the last visit, she reports no major changes in her health. She continues to report diffuse arthralgias and myalgias associated with fibromyalgia. She takes Cymbalta which have helped her symptoms.  She has not reported any constitutional symptoms or peripheral neuropathy. She has not reported any pathological fractures or recurrent infections. She does not report any other neurological symptoms. Her appetite remain excellent and have gained more weight.  She does not report any headaches, blurry vision, double vision or syncope or seizures.She does not report any fevers, chills or sweats. She does not report any lymphadenopathy or petechiae. She does not report any chest pain, shortness of breath, orthopnea or leg edema. Does not report any cough, wheezing or difficulty breathing. She does not report any nausea, vomiting she does report constipation but no diarrhea. She does not report any frequency, urgency or hesitancy. Rest of her review of systems unremarkable.   Medications: I have reviewed the patient's current medications.  Current Outpatient Prescriptions  Medication Sig Dispense Refill  . ALPRAZolam (XANAX) 1 MG tablet Take 1 mg by mouth 3 (three) times daily as needed for anxiety.    . diclofenac sodium (VOLTAREN) 1 % GEL Apply topically 4 (four) times daily.    Marland Kitchen docusate sodium (COLACE) 100 MG capsule Take 100 mg by mouth daily as needed for mild  constipation.    . DULoxetine (CYMBALTA) 60 MG capsule Take 120 mg by mouth daily.    . fluticasone (FLONASE) 50 MCG/ACT nasal spray Place 1 spray into both nostrils daily. 16 g 0  . ibuprofen (ADVIL,MOTRIN) 800 MG tablet Take 1 tablet (800 mg total) by mouth 3 (three) times daily. 21 tablet 0  . omeprazole (PRILOSEC) 40 MG capsule Take 40 mg by mouth 2 (two) times daily.    . QUEtiapine (SEROQUEL) 400 MG tablet Take 400 mg by mouth at bedtime.    Marland Kitchen tiZANidine (ZANAFLEX) 2 MG tablet Take 4 mg by mouth 2 (two) times daily.    . valACYclovir (VALTREX) 1000 MG tablet Take 1,000 mg by mouth daily.     No current facility-administered medications for this visit.      Allergies:  Allergies  Allergen Reactions  . Neurontin [Gabapentin] Other (See Comments)    Caused chest pain  . Shellfish Allergy Hives  . Tramadol Itching and Rash    Past Medical History, Surgical history, Social history, and Family History were reviewed and updated.  Physical Exam: Blood pressure 126/78, pulse 77, temperature 98.1 F (36.7 C), temperature source Oral, resp. rate 16, height 5\' 4"  (1.626 m), weight 216 lb 9.6 oz (98.2 kg), last menstrual period 03/28/2014, SpO2 99 %. ECOG: 0 General appearance: alert and cooperative appeared without distress. Head: Normocephalic, without obvious abnormality oral ulcers or lesions. Neck: no adenopathy Lymph nodes: Cervical, supraclavicular, and axillary nodes normal. Heart:regular rate and rhythm, S1, S2 normal, no murmur, click, rub or gallop Lung:chest clear, no wheezing, rales, normal symmetric air entry Abdomin: soft, non-tender, without masses or organomegaly no rebound or guarding. EXT:no erythema, induration,  or nodules   Lab Results: Lab Results  Component Value Date   WBC 6.0 06/18/2016   HGB 12.8 06/18/2016   HCT 39.5 06/18/2016   MCV 86.0 06/18/2016   PLT 314 06/18/2016     Chemistry      Component Value Date/Time   NA 141 06/19/2015 0938   K 4.4  06/19/2015 0938   CL 105 04/20/2014 1618   CO2 29 06/19/2015 0938   BUN 9.0 06/19/2015 0938   CREATININE 0.8 06/19/2015 0938      Component Value Date/Time   CALCIUM 9.2 06/19/2015 0938   ALKPHOS 85 06/19/2015 0938   AST 17 06/19/2015 0938   ALT 9 06/19/2015 0938   BILITOT 0.61 06/19/2015 0938        Results for Kayla Mendoza (MRN [id number]) as of 06/18/2016 09:17  Ref. Range 06/19/2015 09:38  Abnormal Protein Band2 Latest Units: g/dL NOT DET  Abnormal Protein Band3 Latest Units: g/dL NOT DET  SPE Interp. 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 She has normal quantitative immunoglobulins, normal chemistries and normal CBC. She had multiple imaging studies detailed above including CT scan and MRI without any evidence of bone disease.  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 Her physical examination, laboratory data do not suggest any symptomatic plasma cell disorder. I suggested continuous monitoring for the time being and repeat protein studies in one year. If her M spike continues to be very miniscule, no further follow-up may be needed.  2. Diffuse bone pain: This is related to fibromyalgia rather than a plasma cell disorder.  3. Follow-up: Will be in one year and as needed after that.   [facility], MD 9/1/20179:32 AM

## 2016-06-24 LAB — MULTIPLE MYELOMA PANEL, SERUM
ALBUMIN SERPL ELPH-MCNC: 3.6 g/dL (ref 2.9–4.4)
Albumin/Glob SerPl: 1.1 (ref 0.7–1.7)
Alpha 1: 0.2 g/dL (ref 0.0–0.4)
Alpha2 Glob SerPl Elph-Mcnc: 0.7 g/dL (ref 0.4–1.0)
B-GLOBULIN SERPL ELPH-MCNC: 1.1 g/dL (ref 0.7–1.3)
Gamma Glob SerPl Elph-Mcnc: 1.5 g/dL (ref 0.4–1.8)
Globulin, Total: 3.5 g/dL (ref 2.2–3.9)
IGA/IMMUNOGLOBULIN A, SERUM: 128 mg/dL (ref 87–352)
IGM (IMMUNOGLOBIN M), SRM: 90 mg/dL (ref 26–217)
IgG, Qn, Serum: 1619 mg/dL — ABNORMAL HIGH (ref 700–1600)
M PROTEIN SERPL ELPH-MCNC: 0.6 g/dL — AB
TOTAL PROTEIN: 7.1 g/dL (ref 6.0–8.5)

## 2016-08-26 DIAGNOSIS — S82401A Unspecified fracture of shaft of right fibula, initial encounter for closed fracture: Secondary | ICD-10-CM | POA: Insufficient documentation

## 2016-08-27 DIAGNOSIS — S12101A Unspecified nondisplaced fracture of second cervical vertebra, initial encounter for closed fracture: Secondary | ICD-10-CM | POA: Insufficient documentation

## 2016-08-29 DIAGNOSIS — N3000 Acute cystitis without hematuria: Secondary | ICD-10-CM | POA: Insufficient documentation

## 2016-09-08 DIAGNOSIS — S83511A Sprain of anterior cruciate ligament of right knee, initial encounter: Secondary | ICD-10-CM | POA: Insufficient documentation

## 2016-09-08 DIAGNOSIS — S83211A Bucket-handle tear of medial meniscus, current injury, right knee, initial encounter: Secondary | ICD-10-CM | POA: Insufficient documentation

## 2016-10-13 ENCOUNTER — Ambulatory Visit (HOSPITAL_COMMUNITY)
Admission: EM | Admit: 2016-10-13 | Discharge: 2016-10-13 | Disposition: A | Discharge status: Home / Self Care | Payer: Medicaid Other | Attending: Family Medicine | Admitting: Family Medicine

## 2016-10-13 ENCOUNTER — Encounter (HOSPITAL_COMMUNITY): Payer: Self-pay | Admitting: Emergency Medicine

## 2016-10-13 DIAGNOSIS — S29011A Strain of muscle and tendon of front wall of thorax, initial encounter: Secondary | ICD-10-CM

## 2016-10-13 MED ORDER — IBUPROFEN 800 MG PO TABS: 800.0000 mg | ORAL_TABLET | Freq: Three times a day (TID) | ORAL | 0 refills | Status: DC

## 2016-10-13 MED ORDER — KETOROLAC TROMETHAMINE 60 MG/2ML IM SOLN
60.0000 mg | Freq: Once | INTRAMUSCULAR | Status: AC
  Administered 2016-10-13: 60 mg via INTRAMUSCULAR

## 2016-10-13 MED ORDER — KETOROLAC TROMETHAMINE 60 MG/2ML IM SOLN
INTRAMUSCULAR | Status: AC
  Filled 2016-10-13: qty 2

## 2016-10-13 NOTE — ED Triage Notes (Signed)
PT reports right shoulder and right chest pain. PT reports pain is worse with palpation. PT reports pain is worse with certain movements. PT denies any accompanying symptoms.

## 2016-10-13 NOTE — ED Provider Notes (Signed)
CSN: [id number]     Arrival date & time 10/13/16  1925 History   First MD Initiated Contact with Patient 10/13/16 2032     Chief Complaint  Patient presents with  . Chest Pain   (Consider location/radiation/quality/duration/timing/severity/associated sxs/prior Treatment) Patient c/o right sided chest pain.  She states she has more discomfort when she moves her right upper extremity.   The history is provided by the patient.  Chest Pain  Pain location:  R chest Pain quality: aching   Pain severity:  Moderate Duration:  2 days Timing:  Intermittent Progression:  Worsening   Past Medical History:  Diagnosis Date  . Abnormal uterine bleeding (AUB)   . Anxiety   . Arthritis of back   . Bladder neoplasm   . DDD (degenerative disc disease), lumbar   . Depression   . Fibromyalgia   . GERD (gastroesophageal reflux disease)   . H/O hiatal hernia   . Migraines   . Nocturia   . Pelvic pain in female   . Pinched vertebral nerve   . RLS (restless legs syndrome)   . Scoliosis   . Urgency of urination    Past Surgical History:  Procedure Laterality Date  . ABDOMINAL HYSTERECTOMY    . CYSTOSCOPY N/A 04/17/2014   Procedure: CYSTOSCOPY;  Surgeon: [name], MD;  Location: [facility];  Service: Gynecology;  Laterality: N/A;  clean/contaminated  . CYSTOSCOPY WITH BIOPSY N/A 03/08/2014   Procedure: CYSTOSCOPY WITH  TURBT SMALL HYDROTENSION OF BLADDER INSTALLATION OF MARCAINE AND  PYRIDIUM;  Surgeon: [name], MD;  Location: [facility];  Service: Urology;  Laterality: N/A;  . ESSURE TUBAL LIGATION  2010  . LAPAROSCOPIC ASSISTED VAGINAL HYSTERECTOMY Bilateral 04/17/2014   Procedure: LAPAROSCOPIC ASSISTED VAGINAL HYSTERECTOMY, Bilateral Salpingectomy;  Surgeon: [name], MD;  Location: [facility];  Service: Gynecology;  Laterality: Bilateral;  clean/contaminated  . NEGATIVE SLEEP STUDY  2013   Family History  Problem Relation Age of Onset  . Diabetes Paternal  Grandmother   . Diabetes Paternal Grandfather   . Hypertension Maternal Grandfather    Social History  Substance Use Topics  . Smoking status: Never Smoker  . Smokeless tobacco: Never Used  . Alcohol use No   OB History    No data available     Review of Systems  Constitutional: Negative.   HENT: Negative.   Eyes: Negative.   Respiratory: Negative.   Cardiovascular: Positive for chest pain.  Gastrointestinal: Negative.   Endocrine: Negative.   Genitourinary: Negative.   Musculoskeletal: Negative.   Allergic/Immunologic: Negative.   Neurological: Negative.   Hematological: Negative.     Allergies  Neurontin [gabapentin]; Shellfish allergy; and Tramadol  Home Medications   Prior to Admission medications   Medication Sig Start Date End Date Taking? Authorizing Provider  ALPRAZolam Duanne Moron) 1 MG tablet Take 1 mg by mouth 3 (three) times daily as needed for anxiety.    Historical Provider, MD  diclofenac sodium (VOLTAREN) 1 % GEL Apply topically 4 (four) times daily.    Historical Provider, MD  docusate sodium (COLACE) 100 MG capsule Take 100 mg by mouth daily as needed for mild constipation.    Historical Provider, MD  DULoxetine (CYMBALTA) 60 MG capsule Take 120 mg by mouth daily.    Historical Provider, MD  fluticasone (FLONASE) 50 MCG/ACT nasal spray Place 1 spray into both nostrils daily. 08/13/15   Charlesetta Shanks, MD  ibuprofen (ADVIL,MOTRIN) 800 MG tablet Take 1 tablet (800 mg total) by mouth  3 (three) times daily. 10/13/16   Lysbeth Penner, FNP  omeprazole (PRILOSEC) 40 MG capsule Take 40 mg by mouth 2 (two) times daily.    Historical Provider, MD  QUEtiapine (SEROQUEL) 400 MG tablet Take 400 mg by mouth at bedtime.    Historical Provider, MD  tiZANidine (ZANAFLEX) 2 MG tablet Take 4 mg by mouth 2 (two) times daily. 06/28/13   Dennie Bible, NP  valACYclovir (VALTREX) 1000 MG tablet Take 1,000 mg by mouth daily.    Historical Provider, MD   Meds Ordered and  Administered this Visit   Medications  ketorolac (TORADOL) injection 60 mg (not administered)    BP 146/88   Pulse 110   Temp 99.3 F (37.4 C) (Oral)   Resp 16   Ht 5\' 4"  (1.626 m)   Wt 220 lb (99.8 kg)   LMP 03/28/2014   SpO2 100%   BMI 37.76 kg/m  No data found.   Physical Exam  Constitutional: She appears well-developed and well-nourished.  HENT:  Head: Normocephalic.  Eyes: Conjunctivae and EOM are normal. Pupils are equal, round, and reactive to light.  Neck: Normal range of motion. Neck supple.  Cardiovascular: Normal rate, regular rhythm and normal heart sounds.   Pulmonary/Chest: Effort normal.  Musculoskeletal: She exhibits tenderness.  Right pectoral discomfort with palpation  Nursing note and vitals reviewed.   Urgent Care Course   Clinical Course     Procedures (including critical care time)  Labs Review Labs Reviewed - No data to display  Imaging Review No results found.   Visual Acuity Review  Right Eye Distance:   Left Eye Distance:   Bilateral Distance:    Right Eye Near:   Left Eye Near:    Bilateral Near:         MDM   1. Strain of right pectoralis muscle, initial encounter    Toradol 60mg  IM Ibuprofen 800mg  one po tid x 10 days #30      Lysbeth Penner, FNP 10/13/16 2043

## 2016-10-21 ENCOUNTER — Ambulatory Visit: Payer: Medicaid Other | Admitting: Rheumatology

## 2016-11-10 DIAGNOSIS — Z8669 Personal history of other diseases of the nervous system and sense organs: Secondary | ICD-10-CM | POA: Insufficient documentation

## 2016-11-10 DIAGNOSIS — M19042 Primary osteoarthritis, left hand: Secondary | ICD-10-CM | POA: Insufficient documentation

## 2016-11-10 DIAGNOSIS — R5383 Other fatigue: Secondary | ICD-10-CM | POA: Insufficient documentation

## 2016-11-10 DIAGNOSIS — M5136 Other intervertebral disc degeneration, lumbar region: Secondary | ICD-10-CM | POA: Insufficient documentation

## 2016-11-10 DIAGNOSIS — Z8659 Personal history of other mental and behavioral disorders: Secondary | ICD-10-CM | POA: Insufficient documentation

## 2016-11-10 DIAGNOSIS — F5101 Primary insomnia: Secondary | ICD-10-CM | POA: Insufficient documentation

## 2016-11-10 DIAGNOSIS — E559 Vitamin D deficiency, unspecified: Secondary | ICD-10-CM | POA: Insufficient documentation

## 2016-11-10 DIAGNOSIS — M19041 Primary osteoarthritis, right hand: Secondary | ICD-10-CM | POA: Insufficient documentation

## 2016-11-10 DIAGNOSIS — M170 Bilateral primary osteoarthritis of knee: Secondary | ICD-10-CM | POA: Insufficient documentation

## 2016-11-10 DIAGNOSIS — M797 Fibromyalgia: Secondary | ICD-10-CM | POA: Insufficient documentation

## 2016-11-10 NOTE — Progress Notes (Deleted)
Office Visit Note  Patient: Kayla Mendoza             Date of Birth: 02-17-1976           MRN: [id number]             PCP: Braulio Conte, FNP Referring: Braulio Conte, FNP Visit Date: 11/11/2016 Occupation: @GUAROCC @    Subjective:  No chief complaint on file.   History of Present Illness: Kayla Mendoza is a 41 y.o. female ***   Activities of Daily Living:  Patient reports morning stiffness for *** {minute/hour:19697}.   Patient {ACTIONS;DENIES/REPORTS:21021675::"Denies"} nocturnal pain.  Difficulty dressing/grooming: {ACTIONS;DENIES/REPORTS:21021675::"Denies"} Difficulty climbing stairs: {ACTIONS;DENIES/REPORTS:21021675::"Denies"} Difficulty getting out of chair: {ACTIONS;DENIES/REPORTS:21021675::"Denies"} Difficulty using hands for taps, buttons, cutlery, and/or writing: {ACTIONS;DENIES/REPORTS:21021675::"Denies"}   No Rheumatology ROS completed.   PMFS History:  Patient Active Problem List   Diagnosis Date Noted  . Fibromyalgia 11/10/2016  . Other fatigue 11/10/2016  . Primary insomnia 11/10/2016  . Primary osteoarthritis of both knees 11/10/2016  . Primary osteoarthritis of both hands 11/10/2016  . Vitamin D deficiency 11/10/2016  . Spondylosis of lumbar region without myelopathy or radiculopathy 11/10/2016  . History of anxiety 11/10/2016  . History of restless legs syndrome 11/10/2016  . Pelvic pain in female 04/17/2014  . Back pain 07/23/2013  . Lumbar radiculopathy 07/23/2013  . Migraine without aura, with intractable migraine, so stated, without mention of status migrainosus 06/28/2013  . Tension headache 06/28/2013  . HYPERTHYROIDISM 10/27/2010  . GENERALIZED ANXIETY DISORDER 10/27/2010  . GERD 10/27/2010  . ANKLE SPRAIN, LEFT 10/27/2010    Past Medical History:  Diagnosis Date  . Abnormal uterine bleeding (AUB)   . Anxiety   . Arthritis of back   . Bladder neoplasm   . DDD (degenerative disc disease), lumbar   . Depression   .  Fibromyalgia   . GERD (gastroesophageal reflux disease)   . H/O hiatal hernia   . Migraines   . Nocturia   . Pelvic pain in female   . Pinched vertebral nerve   . RLS (restless legs syndrome)   . Scoliosis   . Urgency of urination     Family History  Problem Relation Age of Onset  . Diabetes Paternal Grandmother   . Diabetes Paternal Grandfather   . Hypertension Maternal Grandfather    Past Surgical History:  Procedure Laterality Date  . ABDOMINAL HYSTERECTOMY    . CYSTOSCOPY N/A 04/17/2014   Procedure: CYSTOSCOPY;  Surgeon: [name], MD;  Location: [facility];  Service: Gynecology;  Laterality: N/A;  clean/contaminated  . CYSTOSCOPY WITH BIOPSY N/A 03/08/2014   Procedure: CYSTOSCOPY WITH  TURBT SMALL HYDROTENSION OF BLADDER INSTALLATION OF MARCAINE AND  PYRIDIUM;  Surgeon: [name], MD;  Location: [facility];  Service: Urology;  Laterality: N/A;  . ESSURE TUBAL LIGATION  2010  . LAPAROSCOPIC ASSISTED VAGINAL HYSTERECTOMY Bilateral 04/17/2014   Procedure: LAPAROSCOPIC ASSISTED VAGINAL HYSTERECTOMY, Bilateral Salpingectomy;  Surgeon: [name], MD;  Location: [facility];  Service: Gynecology;  Laterality: Bilateral;  clean/contaminated  . NEGATIVE SLEEP STUDY  2013   Social History   Social History Narrative   Patient is divorced.   Patient lives at home with her 4 children.    Patient has a college degree.   Patient is right-handed.   Patient drinks very little caffeine-coffee.              Objective: Vital Signs: LMP 03/28/2014    Physical Exam   Musculoskeletal  Exam: ***  CDAI Exam: No CDAI exam completed.    Investigation: Findings:  January 2015:  MRI of her lumbar spine showed mild disk disease with most severe at L2-3.  L2 nerve encroachment and no interval change from April 2013.  X-ray of bilateral hands, 2 views, today, showed minimal PIP narrowing.  No MCP changes, intercarpal joint changes or erosive changes were noted.  These  findings were consistent with early osteoarthritis.  Bilateral knee joint x-rays showed bilateral moderate medial compartment narrowing, bilateral mild patellofemoral narrowing without any chondrocalcinosis consistent with moderate osteoarthritis.  Bilateral hip joint x-rays were within normal limits without any joint space narrowing.    Labs from November 26, 2014, show CMP with GFR normal.  CBC with diff is normal.  Sed rate is normal.  Uric acid is normal.  CK is normal  ACE is normal.  TSH is normal.  Urinalysis was normal.  IgG, IgM, IgA are normal.  Rheumatoid factor is normal.  ANA normal.  SPEP M-spike is normal.  CCP is normal.  IFE is positive for IgG kappa protein.  She has already seen Dr. Alen Blew and has a followup appointment in 6 months.  Vitamin D is low at 1 and we have treated her for this in early part of February for a 19-month span.     Imaging: No results found.  Speciality Comments: No specialty comments available.    Procedures:  No procedures performed Allergies: Neurontin [gabapentin]; Shellfish allergy; and Tramadol   Assessment / Plan:     Visit Diagnoses: Fibromyalgia  Other fatigue  Primary insomnia  Primary osteoarthritis of both knees  Primary osteoarthritis of both hands  Vitamin D deficiency  Spondylosis of lumbar region without myelopathy or radiculopathy  History of anxiety  History of restless legs syndrome    Orders: No orders of the defined types were placed in this encounter.  No orders of the defined types were placed in this encounter.   Face-to-face time spent with patient was *** minutes. 50% of time was spent in counseling and coordination of care.  Follow-Up Instructions: No Follow-up on file.   Amy Littrell, RT  Note - This record has been created using [employer].  Chart creation errors have been sought, but may not always  have been located. Such creation errors do not reflect on  the standard of medical care.

## 2016-11-11 ENCOUNTER — Ambulatory Visit: Payer: Medicaid Other | Admitting: Rheumatology

## 2016-11-24 DIAGNOSIS — R600 Localized edema: Secondary | ICD-10-CM | POA: Insufficient documentation

## 2016-11-24 DIAGNOSIS — R011 Cardiac murmur, unspecified: Secondary | ICD-10-CM | POA: Insufficient documentation
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
## 2016-12-17 ENCOUNTER — Encounter: Payer: Self-pay | Admitting: Rheumatology

## 2016-12-17 ENCOUNTER — Ambulatory Visit (INDEPENDENT_AMBULATORY_CARE_PROVIDER_SITE_OTHER): Payer: Medicaid Other | Admitting: Rheumatology

## 2016-12-17 VITALS — BP 120/92 | HR 88 | Resp 14 | Ht 64.0 in | Wt 234.0 lb

## 2016-12-17 DIAGNOSIS — F5101 Primary insomnia: Secondary | ICD-10-CM | POA: Diagnosis not present

## 2016-12-17 DIAGNOSIS — M797 Fibromyalgia: Secondary | ICD-10-CM

## 2016-12-17 DIAGNOSIS — R5383 Other fatigue: Secondary | ICD-10-CM

## 2016-12-17 DIAGNOSIS — M170 Bilateral primary osteoarthritis of knee: Secondary | ICD-10-CM

## 2016-12-17 MED ORDER — DICLOFENAC SODIUM 1 % TD GEL: 3.0000 g | Freq: Four times a day (QID) | TRANSDERMAL | 3 refills | Status: DC

## 2016-12-20 ENCOUNTER — Telehealth: Payer: Self-pay

## 2016-12-20 NOTE — Telephone Encounter (Signed)
Received a prior authorization request for patient's Rx for Diclofenac Gel. Checked the [place] Medicaid preferred drug list and found that brand name voltaren gel is preferred vs the generic. Spoke with Santiago Glad from [facility] who ran a test claim with the brand and got a paid claim for the patient.    Spoke with patient who voiced understanding and denied any questions at this time.   Kayla Mendoza 9:58 AM

## 2017-02-07 ENCOUNTER — Other Ambulatory Visit: Payer: Self-pay | Admitting: General Surgery

## 2017-02-14 ENCOUNTER — Encounter (HOSPITAL_BASED_OUTPATIENT_CLINIC_OR_DEPARTMENT_OTHER): Payer: Self-pay | Admitting: *Deleted

## 2017-02-16 ENCOUNTER — Ambulatory Visit (INDEPENDENT_AMBULATORY_CARE_PROVIDER_SITE_OTHER): Payer: Medicaid Other | Admitting: Rheumatology

## 2017-02-16 DIAGNOSIS — M25562 Pain in left knee: Secondary | ICD-10-CM | POA: Diagnosis not present

## 2017-02-16 DIAGNOSIS — M25561 Pain in right knee: Secondary | ICD-10-CM | POA: Diagnosis not present

## 2017-02-16 DIAGNOSIS — G8929 Other chronic pain: Secondary | ICD-10-CM

## 2017-02-16 DIAGNOSIS — M170 Bilateral primary osteoarthritis of knee: Secondary | ICD-10-CM | POA: Diagnosis not present

## 2017-02-16 MED ORDER — LIDOCAINE HCL 1 % IJ SOLN
2.0000 mL | INTRAMUSCULAR | Status: AC | PRN
  Administered 2017-02-16: 2 mL

## 2017-02-16 MED ORDER — SODIUM HYALURONATE (VISCOSUP) 20 MG/2ML IX SOSY
20.0000 mg | PREFILLED_SYRINGE | INTRA_ARTICULAR | Status: AC | PRN
  Administered 2017-02-16: 20 mg via INTRA_ARTICULAR

## 2017-02-16 NOTE — Progress Notes (Signed)
   Procedure Note  Patient: Kayla Mendoza             Date of Birth: 02-08-1976           MRN: [id number]             Visit Date: 02/16/2017  Procedures: Visit Diagnoses: No diagnosis found.  Large Joint Inj Date/Time: 02/16/2017 3:19 PM Performed by: Eliezer Lofts Authorized by: Eliezer Lofts   Consent Given by:  Patient Site marked: the procedure site was marked   Timeout: prior to procedure the correct patient, procedure, and site was verified   Indications:  Pain and joint swelling Location:  Knee Site:  R knee Prep: patient was prepped and draped in usual sterile fashion   Needle Size:  27 G Needle Length:  1.5 inches Approach:  Medial Ultrasound Guidance: No   Fluoroscopic Guidance: No   Arthrogram: No   Medications:  2 mL lidocaine 1 %; 20 mg Sodium Hyaluronate 20 MG/2ML Aspiration Attempted: Yes   Patient tolerance:  Patient tolerated the procedure well with no immediate complications   Patient purchased Euflexxa No. 1 to bilateral knees  Note: Patient has had 2 other series of Euflex in the past and has done well. Today we are beginning series #3 Large Joint Inj Date/Time: 02/16/2017 3:21 PM Performed by: Eliezer Lofts Authorized by: Eliezer Lofts   Consent Given by:  Patient Site marked: the procedure site was marked   Timeout: prior to procedure the correct patient, procedure, and site was verified   Indications:  Pain and joint swelling Location:  Knee Site:  L knee Prep: patient was prepped and draped in usual sterile fashion   Needle Size:  27 G Needle Length:  1.5 inches Approach:  Medial Ultrasound Guidance: No   Fluoroscopic Guidance: No   Arthrogram: No   Medications:  20 mg Sodium Hyaluronate 20 MG/2ML; 2 mL lidocaine 1 % Aspiration Attempted: Yes   Aspirate amount (mL):  0 Patient tolerance:  Patient tolerated the procedure well with no immediate complications  Patient purchased Euflexxa No. 1 to bilateral knees  Note: Patient has  had 2 other series of Euflex in the past and has done well. Today we are beginning series #3

## 2017-02-18 ENCOUNTER — Encounter (HOSPITAL_BASED_OUTPATIENT_CLINIC_OR_DEPARTMENT_OTHER)
Admission: RE | Admit: 2017-02-18 | Discharge: 2017-02-18 | Disposition: A | Discharge status: Home / Self Care | Payer: Medicaid Other | Source: Ambulatory Visit | Attending: General Surgery | Admitting: General Surgery

## 2017-02-18 DIAGNOSIS — F419 Anxiety disorder, unspecified: Secondary | ICD-10-CM | POA: Diagnosis not present

## 2017-02-18 DIAGNOSIS — Z79899 Other long term (current) drug therapy: Secondary | ICD-10-CM | POA: Diagnosis not present

## 2017-02-18 DIAGNOSIS — Z01812 Encounter for preprocedural laboratory examination: Secondary | ICD-10-CM | POA: Diagnosis present

## 2017-02-18 DIAGNOSIS — K219 Gastro-esophageal reflux disease without esophagitis: Secondary | ICD-10-CM | POA: Diagnosis not present

## 2017-02-18 DIAGNOSIS — D171 Benign lipomatous neoplasm of skin and subcutaneous tissue of trunk: Secondary | ICD-10-CM | POA: Diagnosis not present

## 2017-02-18 DIAGNOSIS — Z79891 Long term (current) use of opiate analgesic: Secondary | ICD-10-CM | POA: Diagnosis not present

## 2017-02-18 DIAGNOSIS — M797 Fibromyalgia: Secondary | ICD-10-CM | POA: Diagnosis not present

## 2017-02-18 DIAGNOSIS — G4730 Sleep apnea, unspecified: Secondary | ICD-10-CM | POA: Diagnosis not present

## 2017-02-18 DIAGNOSIS — D472 Monoclonal gammopathy: Secondary | ICD-10-CM | POA: Diagnosis not present

## 2017-02-18 DIAGNOSIS — F329 Major depressive disorder, single episode, unspecified: Secondary | ICD-10-CM | POA: Diagnosis not present

## 2017-02-18 DIAGNOSIS — Z6841 Body Mass Index (BMI) 40.0 and over, adult: Secondary | ICD-10-CM | POA: Diagnosis not present

## 2017-02-18 LAB — CBC WITH DIFFERENTIAL/PLATELET
Basophils Absolute: 0 10*3/uL (ref 0.0–0.1)
Basophils Relative: 0 %
EOS PCT: 3 %
Eosinophils Absolute: 0.4 10*3/uL (ref 0.0–0.7)
HEMATOCRIT: 34.6 % — AB (ref 36.0–46.0)
Hemoglobin: 11.5 g/dL — ABNORMAL LOW (ref 12.0–15.0)
LYMPHS ABS: 3.8 10*3/uL (ref 0.7–4.0)
LYMPHS PCT: 34 %
MCH: 28.2 pg (ref 26.0–34.0)
MCHC: 33.2 g/dL (ref 30.0–36.0)
MCV: 84.8 fL (ref 78.0–100.0)
MONO ABS: 0.2 10*3/uL (ref 0.1–1.0)
MONOS PCT: 2 %
NEUTROS ABS: 6.7 10*3/uL (ref 1.7–7.7)
Neutrophils Relative %: 61 %
PLATELETS: 372 10*3/uL (ref 150–400)
RBC: 4.08 MIL/uL (ref 3.87–5.11)
RDW: 14.5 % (ref 11.5–15.5)
WBC: 11.1 10*3/uL — ABNORMAL HIGH (ref 4.0–10.5)

## 2017-02-18 LAB — COMPREHENSIVE METABOLIC PANEL
ALT: 16 U/L (ref 14–54)
AST: 20 U/L (ref 15–41)
Albumin: 3.7 g/dL (ref 3.5–5.0)
Alkaline Phosphatase: 118 U/L (ref 38–126)
Anion gap: 10 (ref 5–15)
BILIRUBIN TOTAL: 0.6 mg/dL (ref 0.3–1.2)
BUN: 5 mg/dL — AB (ref 6–20)
CHLORIDE: 100 mmol/L — AB (ref 101–111)
CO2: 28 mmol/L (ref 22–32)
CREATININE: 0.76 mg/dL (ref 0.44–1.00)
Calcium: 8.8 mg/dL — ABNORMAL LOW (ref 8.9–10.3)
Glucose, Bld: 84 mg/dL (ref 65–99)
POTASSIUM: 4 mmol/L (ref 3.5–5.1)
Sodium: 138 mmol/L (ref 135–145)
TOTAL PROTEIN: 8.1 g/dL (ref 6.5–8.1)
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
## 2017-02-22 ENCOUNTER — Ambulatory Visit (HOSPITAL_BASED_OUTPATIENT_CLINIC_OR_DEPARTMENT_OTHER)
Admission: RE | Admit: 2017-02-22 | Discharge: 2017-02-22 | Disposition: A | Discharge status: Home / Self Care | Payer: Medicaid Other | Source: Ambulatory Visit | Attending: General Surgery | Admitting: General Surgery

## 2017-02-22 ENCOUNTER — Ambulatory Visit (HOSPITAL_BASED_OUTPATIENT_CLINIC_OR_DEPARTMENT_OTHER): Payer: Medicaid Other | Admitting: Anesthesiology

## 2017-02-22 ENCOUNTER — Encounter (HOSPITAL_BASED_OUTPATIENT_CLINIC_OR_DEPARTMENT_OTHER)
Admission: RE | Disposition: A | Discharge status: Home / Self Care | Payer: Self-pay | Source: Ambulatory Visit | Attending: General Surgery

## 2017-02-22 ENCOUNTER — Encounter (HOSPITAL_BASED_OUTPATIENT_CLINIC_OR_DEPARTMENT_OTHER): Payer: Self-pay | Admitting: Anesthesiology

## 2017-02-22 DIAGNOSIS — K219 Gastro-esophageal reflux disease without esophagitis: Secondary | ICD-10-CM | POA: Insufficient documentation

## 2017-02-22 DIAGNOSIS — Z6841 Body Mass Index (BMI) 40.0 and over, adult: Secondary | ICD-10-CM | POA: Insufficient documentation

## 2017-02-22 DIAGNOSIS — D472 Monoclonal gammopathy: Secondary | ICD-10-CM | POA: Insufficient documentation

## 2017-02-22 DIAGNOSIS — F419 Anxiety disorder, unspecified: Secondary | ICD-10-CM | POA: Insufficient documentation

## 2017-02-22 DIAGNOSIS — D171 Benign lipomatous neoplasm of skin and subcutaneous tissue of trunk: Secondary | ICD-10-CM | POA: Diagnosis present

## 2017-02-22 DIAGNOSIS — G4730 Sleep apnea, unspecified: Secondary | ICD-10-CM | POA: Insufficient documentation

## 2017-02-22 DIAGNOSIS — F329 Major depressive disorder, single episode, unspecified: Secondary | ICD-10-CM | POA: Insufficient documentation

## 2017-02-22 DIAGNOSIS — Z79891 Long term (current) use of opiate analgesic: Secondary | ICD-10-CM | POA: Insufficient documentation

## 2017-02-22 DIAGNOSIS — M797 Fibromyalgia: Secondary | ICD-10-CM | POA: Insufficient documentation

## 2017-02-22 DIAGNOSIS — Z79899 Other long term (current) drug therapy: Secondary | ICD-10-CM | POA: Insufficient documentation

## 2017-02-22 HISTORY — DX: Sleep apnea, unspecified: G47.30

## 2017-02-22 HISTORY — PX: EXCISION OF BACK LESION: SHX6597

## 2017-02-22 SURGERY — EXCISION, LESION, BACK
Anesthesia: General | Site: Back | Laterality: Right

## 2017-02-22 MED ORDER — CHLORHEXIDINE GLUCONATE CLOTH 2 % EX PADS: 6.0000 | MEDICATED_PAD | Freq: Once | CUTANEOUS | Status: DC

## 2017-02-22 MED ORDER — ACETAMINOPHEN 500 MG PO TABS
1000.0000 mg | ORAL_TABLET | ORAL | Status: AC
  Administered 2017-02-22: 1000 mg via ORAL

## 2017-02-22 MED ORDER — PHENYLEPHRINE HCL 10 MG/ML IJ SOLN
INTRAMUSCULAR | Status: DC | PRN
  Administered 2017-02-22 (×2): 80 ug via INTRAVENOUS

## 2017-02-22 MED ORDER — ACETAMINOPHEN 500 MG PO TABS
ORAL_TABLET | ORAL | Status: AC
  Filled 2017-02-22: qty 2

## 2017-02-22 MED ORDER — DEXAMETHASONE SODIUM PHOSPHATE 10 MG/ML IJ SOLN
INTRAMUSCULAR | Status: AC
  Filled 2017-02-22: qty 1

## 2017-02-22 MED ORDER — MIDAZOLAM HCL 2 MG/2ML IJ SOLN
1.0000 mg | INTRAMUSCULAR | Status: DC | PRN
  Administered 2017-02-22: 2 mg via INTRAVENOUS

## 2017-02-22 MED ORDER — FENTANYL CITRATE (PF) 100 MCG/2ML IJ SOLN
INTRAMUSCULAR | Status: AC
  Filled 2017-02-22: qty 2

## 2017-02-22 MED ORDER — ONDANSETRON HCL 4 MG/2ML IJ SOLN
INTRAMUSCULAR | Status: AC
  Filled 2017-02-22: qty 2

## 2017-02-22 MED ORDER — MEPERIDINE HCL 25 MG/ML IJ SOLN: 6.2500 mg | INTRAMUSCULAR | Status: DC | PRN

## 2017-02-22 MED ORDER — LIDOCAINE HCL 1 % IJ SOLN
INTRAMUSCULAR | Status: AC
  Filled 2017-02-22: qty 20

## 2017-02-22 MED ORDER — FENTANYL CITRATE (PF) 100 MCG/2ML IJ SOLN
50.0000 ug | INTRAMUSCULAR | Status: DC | PRN
  Administered 2017-02-22: 100 ug via INTRAVENOUS
  Administered 2017-02-22: 50 ug via INTRAVENOUS

## 2017-02-22 MED ORDER — CEFAZOLIN SODIUM-DEXTROSE 2-4 GM/100ML-% IV SOLN
INTRAVENOUS | Status: AC
  Filled 2017-02-22: qty 100

## 2017-02-22 MED ORDER — PROPOFOL 10 MG/ML IV BOLUS
INTRAVENOUS | Status: DC | PRN
  Administered 2017-02-22: 180 mg via INTRAVENOUS

## 2017-02-22 MED ORDER — LACTATED RINGERS IV SOLN: INTRAVENOUS | Status: DC

## 2017-02-22 MED ORDER — BUPIVACAINE-EPINEPHRINE 0.5% -1:200000 IJ SOLN
INTRAMUSCULAR | Status: DC | PRN
  Administered 2017-02-22: 10 mL

## 2017-02-22 MED ORDER — BUPIVACAINE-EPINEPHRINE (PF) 0.5% -1:200000 IJ SOLN
INTRAMUSCULAR | Status: AC
  Filled 2017-02-22: qty 30

## 2017-02-22 MED ORDER — OXYCODONE HCL 5 MG PO TABS
5.0000 mg | ORAL_TABLET | ORAL | Status: DC | PRN
  Administered 2017-02-22: 5 mg via ORAL

## 2017-02-22 MED ORDER — DEXAMETHASONE SODIUM PHOSPHATE 4 MG/ML IJ SOLN
INTRAMUSCULAR | Status: DC | PRN
  Administered 2017-02-22: 10 mg via INTRAVENOUS

## 2017-02-22 MED ORDER — SODIUM CHLORIDE 0.9 % IV SOLN: 250.0000 mL | INTRAVENOUS | Status: DC | PRN

## 2017-02-22 MED ORDER — FENTANYL CITRATE (PF) 100 MCG/2ML IJ SOLN: 25.0000 ug | INTRAMUSCULAR | Status: DC | PRN

## 2017-02-22 MED ORDER — SCOPOLAMINE 1 MG/3DAYS TD PT72: 1.0000 | MEDICATED_PATCH | Freq: Once | TRANSDERMAL | Status: DC | PRN

## 2017-02-22 MED ORDER — LIDOCAINE-EPINEPHRINE (PF) 1 %-1:200000 IJ SOLN
INTRAMUSCULAR | Status: AC
  Filled 2017-02-22: qty 30

## 2017-02-22 MED ORDER — ONDANSETRON HCL 4 MG/2ML IJ SOLN
INTRAMUSCULAR | Status: DC | PRN
  Administered 2017-02-22: 4 mg via INTRAVENOUS

## 2017-02-22 MED ORDER — OXYCODONE HCL 5 MG PO TABS
ORAL_TABLET | ORAL | Status: AC
  Filled 2017-02-22: qty 1

## 2017-02-22 MED ORDER — CELECOXIB 200 MG PO CAPS
ORAL_CAPSULE | ORAL | Status: AC
  Filled 2017-02-22: qty 2

## 2017-02-22 MED ORDER — LACTATED RINGERS IV SOLN
INTRAVENOUS | Status: DC
  Administered 2017-02-22: 14:00:00 via INTRAVENOUS

## 2017-02-22 MED ORDER — SUCCINYLCHOLINE CHLORIDE 20 MG/ML IJ SOLN
INTRAMUSCULAR | Status: DC | PRN
  Administered 2017-02-22: 100 mg via INTRAVENOUS

## 2017-02-22 MED ORDER — MIDAZOLAM HCL 2 MG/2ML IJ SOLN
INTRAMUSCULAR | Status: AC
  Filled 2017-02-22: qty 2

## 2017-02-22 MED ORDER — HYDROCODONE-ACETAMINOPHEN 5-325 MG PO TABS: 1.0000 | ORAL_TABLET | Freq: Four times a day (QID) | ORAL | 0 refills | Status: DC | PRN

## 2017-02-22 MED ORDER — CEFAZOLIN SODIUM-DEXTROSE 2-4 GM/100ML-% IV SOLN
2.0000 g | INTRAVENOUS | Status: AC
  Administered 2017-02-22: 2 g via INTRAVENOUS

## 2017-02-22 MED ORDER — EPHEDRINE SULFATE 50 MG/ML IJ SOLN
INTRAMUSCULAR | Status: DC | PRN
  Administered 2017-02-22 (×2): 10 mg via INTRAVENOUS

## 2017-02-22 MED ORDER — LIDOCAINE 2% (20 MG/ML) 5 ML SYRINGE
INTRAMUSCULAR | Status: AC
  Filled 2017-02-22: qty 5

## 2017-02-22 MED ORDER — CELECOXIB 400 MG PO CAPS
400.0000 mg | ORAL_CAPSULE | ORAL | Status: AC
  Administered 2017-02-22: 400 mg via ORAL

## 2017-02-22 MED ORDER — METOCLOPRAMIDE HCL 5 MG/ML IJ SOLN: 10.0000 mg | Freq: Once | INTRAMUSCULAR | Status: DC | PRN

## 2017-02-22 MED ORDER — ACETAMINOPHEN 650 MG RE SUPP: 650.0000 mg | RECTAL | Status: DC | PRN

## 2017-02-22 MED ORDER — PROPOFOL 10 MG/ML IV BOLUS
INTRAVENOUS | Status: AC
  Filled 2017-02-22: qty 20

## 2017-02-22 MED ORDER — SODIUM CHLORIDE 0.9% FLUSH: 3.0000 mL | Freq: Two times a day (BID) | INTRAVENOUS | Status: DC

## 2017-02-22 MED ORDER — SODIUM CHLORIDE 0.9% FLUSH: 3.0000 mL | INTRAVENOUS | Status: DC | PRN

## 2017-02-22 MED ORDER — FENTANYL CITRATE (PF) 100 MCG/2ML IJ SOLN
25.0000 ug | INTRAMUSCULAR | Status: DC | PRN
  Administered 2017-02-22: 25 ug via INTRAVENOUS

## 2017-02-22 MED ORDER — SODIUM BICARBONATE 4 % IV SOLN
INTRAVENOUS | Status: AC
  Filled 2017-02-22: qty 5

## 2017-02-22 MED ORDER — ACETAMINOPHEN 325 MG PO TABS: 650.0000 mg | ORAL_TABLET | ORAL | Status: DC | PRN

## 2017-02-22 SURGICAL SUPPLY — 54 items
BANDAGE ACE 6X5 VEL STRL LF (GAUZE/BANDAGES/DRESSINGS) IMPLANT
BENZOIN TINCTURE PRP APPL 2/3 (GAUZE/BANDAGES/DRESSINGS) IMPLANT
BLADE HEX COATED 2.75 (ELECTRODE) ×2 IMPLANT
BLADE SURG 15 STRL LF DISP TIS (BLADE) ×1 IMPLANT
BLADE SURG 15 STRL SS (BLADE) ×1
CANISTER SUCT 1200ML W/VALVE (MISCELLANEOUS) ×2 IMPLANT
CHLORAPREP W/TINT 26ML (MISCELLANEOUS) ×2 IMPLANT
COVER BACK TABLE 60X90IN (DRAPES) ×2 IMPLANT
COVER MAYO STAND STRL (DRAPES) ×2 IMPLANT
DECANTER SPIKE VIAL GLASS SM (MISCELLANEOUS) IMPLANT
DERMABOND ADVANCED (GAUZE/BANDAGES/DRESSINGS) ×1
DERMABOND ADVANCED .7 DNX12 (GAUZE/BANDAGES/DRESSINGS) ×1 IMPLANT
DRAPE LAPAROTOMY 100X72 PEDS (DRAPES) ×2 IMPLANT
DRAPE LAPAROTOMY TRNSV 102X78 (DRAPE) IMPLANT
DRAPE UTILITY XL STRL (DRAPES) ×2 IMPLANT
ELECT REM PT RETURN 9FT ADLT (ELECTROSURGICAL) ×2
ELECTRODE REM PT RTRN 9FT ADLT (ELECTROSURGICAL) ×1 IMPLANT
GAUZE SPONGE 4X4 12PLY STRL LF (GAUZE/BANDAGES/DRESSINGS) IMPLANT
GAUZE SPONGE 4X4 16PLY XRAY LF (GAUZE/BANDAGES/DRESSINGS) IMPLANT
GLOVE BIO SURGEON STRL SZ7.5 (GLOVE) ×2 IMPLANT
GLOVE BIOGEL PI IND STRL 6.5 (GLOVE) ×1 IMPLANT
GLOVE BIOGEL PI INDICATOR 6.5 (GLOVE) ×1
GLOVE EUDERMIC 7 POWDERFREE (GLOVE) ×2 IMPLANT
GOWN STRL REUS W/ TWL LRG LVL3 (GOWN DISPOSABLE) IMPLANT
GOWN STRL REUS W/ TWL XL LVL3 (GOWN DISPOSABLE) ×2 IMPLANT
GOWN STRL REUS W/TWL LRG LVL3 (GOWN DISPOSABLE)
GOWN STRL REUS W/TWL XL LVL3 (GOWN DISPOSABLE) ×2
NEEDLE HYPO 22GX1.5 SAFETY (NEEDLE) IMPLANT
NEEDLE HYPO 25X1 1.5 SAFETY (NEEDLE) ×2 IMPLANT
NS IRRIG 1000ML POUR BTL (IV SOLUTION) ×2 IMPLANT
PACK BASIN DAY SURGERY FS (CUSTOM PROCEDURE TRAY) ×2 IMPLANT
PENCIL BUTTON HOLSTER BLD 10FT (ELECTRODE) ×2 IMPLANT
SHEET MEDIUM DRAPE 40X70 STRL (DRAPES) IMPLANT
SLEEVE SCD COMPRESS KNEE MED (MISCELLANEOUS) ×2 IMPLANT
SPONGE LAP 4X18 X RAY DECT (DISPOSABLE) ×2 IMPLANT
STAPLER VISISTAT 35W (STAPLE) IMPLANT
STRIP CLOSURE SKIN 1/2X4 (GAUZE/BANDAGES/DRESSINGS) IMPLANT
SUT ETHILON 4 0 PS 2 18 (SUTURE) IMPLANT
SUT MNCRL AB 4-0 PS2 18 (SUTURE) IMPLANT
SUT SILK 2 0 SH (SUTURE) ×2 IMPLANT
SUT VIC AB 2-0 SH 27 (SUTURE)
SUT VIC AB 2-0 SH 27XBRD (SUTURE) IMPLANT
SUT VIC AB 3-0 FS2 27 (SUTURE) IMPLANT
SUT VIC AB 4-0 P-3 18XBRD (SUTURE) IMPLANT
SUT VIC AB 4-0 P3 18 (SUTURE)
SUT VICRYL 3-0 CR8 SH (SUTURE) ×2 IMPLANT
SUT VICRYL 4-0 PS2 18IN ABS (SUTURE) IMPLANT
SYR 10ML LL (SYRINGE) ×2 IMPLANT
SYR BULB 3OZ (MISCELLANEOUS) IMPLANT
TAPE HYPAFIX 4 X10 (GAUZE/BANDAGES/DRESSINGS) IMPLANT
TOWEL OR 17X24 6PK STRL BLUE (TOWEL DISPOSABLE) ×2 IMPLANT
TOWEL OR NON WOVEN STRL DISP B (DISPOSABLE) ×2 IMPLANT
TUBE CONNECTING 20X1/4 (TUBING) ×2 IMPLANT
YANKAUER SUCT BULB TIP NO VENT (SUCTIONS) ×2 IMPLANT

## 2017-02-22 NOTE — Op Note (Addendum)
Patient Name:           Kayla Mendoza   Date of Surgery:        02/22/2017  Pre op Diagnosis:      Recurrent soft tissue mass right flank, 8-10 cm diameter, suspect lipoma  Post op Diagnosis:    Same  Procedure:                 Excision recurrent lipoma right flank  Surgeon:                     Edsel Petrin. Dalbert Batman, M.D., FACS  Assistant:                      OR staff   Indication for Assistant: n/a  Operative Indications:    . This is a 41 year old African-American female, referred by Dr. Ilene Qua in [facility] for evaluation of what looks like a recurrent lipoma of the right back. Dr. Willis Modena is her OB/GYN. 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 She does not remember the surgeon's name. She was told this was a benign fatty tumor. This has recurred and is growing and is much bigger than it was before. There's been no inflammatory changes. No real pain. No drainage. She states she had a mammogram at [place] this year and was told that was normal. She was this area excised because it's in the bra strap and growing.      Past history consistent with fibromyalgia, chronic lower extremity edema on Lasix, GERD. 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 Monoclonal gammopathy.       I discussed the differential diagnosis with her. Probably a lipoma. She was told that surgery is elective. Because it will probably continue to grow and is already bothering her at her bra strap line, excision is reasonable. She will be scheduled for excision of recurrent 8 cm soft tissue mass of right back under anesthesia in the near future.She agrees with this plan.  Operative Findings:       I made a 10 cm long by 2 cm wide elliptical incision excising the old scar.  There was a 7-8 cm subcutaneous mass that went  down to the muscle fascia but appeared to be a benign  lipoma.  This was completely dissected and removed en bloc.  Procedure in Detail:          Following the induction of general endotracheal anesthesia the patient was positioned in a left lateral decubitus position using appropriate padding and beanbag.  The skin good exposure to the right flank.    The old scar and tumor mass were in the posterior axillary line immediately lateral and posterior to the breast.  A transverse elliptical incision was made.  Dissection was carried down through subcutaneous tissue.  I encountered the capsule of the lipoma and with traction and countertraction exposed this and resected the lipoma taking his capsule with it.  The specimen was removed and sent to the lab.  Hemostasis excellent.  Wound was irrigated.  The subcutaneous tissue was closed with interrupted sutures of 3-0 Vicryl closing down to the muscle fascia.  The skin was closed with a running subcuticular 4-0 Monocryl and Dermabond.  The patient tolerated the procedure well was taken to PACU in stable condition.  EBL 10-20 mL.  Counts correct.  Complications none.     Edsel Petrin. Dalbert Batman, M.D., FACS General and Minimally Invasive Surgery Breast and Colorectal Surgery   Addendum: The patient was given a prescription for Norco for pain.  I logged on to the [employer] and reviewed her prescription medication history.  02/22/2017 3:10 PM

## 2017-02-22 NOTE — Transfer of Care (Signed)
Immediate Anesthesia Transfer of Care Note  Patient: Kayla Mendoza  Procedure(s) Performed: Procedure(s): EXCISION OF MASS RIGHT BACK (Right)  Patient Location: PACU  Anesthesia Type:General  Level of Consciousness: sedated  Airway & Oxygen Therapy: Patient Spontanous Breathing and Patient connected to face mask oxygen  Post-op Assessment: Report given to RN and Post -op Vital signs reviewed and stable  Post vital signs: Reviewed and stable  Last Vitals:  Vitals:   02/22/17 1331 02/22/17 1520  BP: 120/78 120/80  Pulse: 89 93  Resp: 18 15  Temp: 36.9 C 36.7 C    Last Pain:  Vitals:   02/22/17 1331  TempSrc: Oral         Complications: No apparent anesthesia complications

## 2017-02-22 NOTE — Anesthesia Preprocedure Evaluation (Signed)
Anesthesia Evaluation  Patient identified by MRN, date of birth, ID band Patient awake    Reviewed: Allergy & Precautions, NPO status , Patient's Chart, lab work & pertinent test results  Airway Mallampati: II  TM Distance: >3 FB Neck ROM: Full    Dental no notable dental hx.    Pulmonary sleep apnea ,    Pulmonary exam normal breath sounds clear to auscultation       Cardiovascular negative cardio ROS Normal cardiovascular exam Rhythm:Regular Rate:Normal     Neuro/Psych negative neurological ROS  negative psych ROS   GI/Hepatic negative GI ROS, Neg liver ROS, hiatal hernia, GERD  Medicated and Controlled,  Endo/Other  Hyperthyroidism Morbid obesity  Renal/GU negative Renal ROS  negative genitourinary   Musculoskeletal  (+) Fibromyalgia -  Abdominal   Peds negative pediatric ROS (+)  Hematology negative hematology ROS (+)   Anesthesia Other Findings   Reproductive/Obstetrics negative OB ROS                             Anesthesia Physical Anesthesia Plan  ASA: III  Anesthesia Plan: General   Post-op Pain Management:    Induction: Intravenous  Airway Management Planned: Oral ETT  Additional Equipment:   Intra-op Plan:   Post-operative Plan: Extubation in OR  Informed Consent: I have reviewed the patients History and Physical, chart, labs and discussed the procedure including the risks, benefits and alternatives for the proposed anesthesia with the patient or authorized representative who has indicated his/her understanding and acceptance.   Dental advisory given  Plan Discussed with: CRNA  Anesthesia Plan Comments:         Anesthesia Quick Evaluation

## 2017-02-22 NOTE — Discharge Instructions (Signed)
Stay inside the house for 24 hours. You may walk around the house from room to room. Watch your step carefully to avoid tripping.  Ice pack over the wound for 10 minutes at a time, then given a rest.  Do this 2 or 3 times an hour for 24 hours.  You may take a shower, starting Thursday.  No tub baths.  The clear plastic superglue over the wound will flake off in about 3 weeks  Drink lots of fluids.  Take something for constipation.  We will call the pathology report to you, most likely on Friday.  No sports or heavy lifting for 3-4 weeks.  Be sure to make an appointment to see Dr. Dalbert Batman in 3 weeks  Post Anesthesia Home Care Instructions  Activity: Get plenty of rest for the remainder of the day. A responsible individual must stay with you for 24 hours following the procedure.  For the next 24 hours, DO NOT: -Drive a car -Paediatric nurse -Drink alcoholic beverages -Take any medication unless instructed by your physician -Make any legal decisions or sign important papers.  Meals: Start with liquid foods such as gelatin or soup. Progress to regular foods as tolerated. Avoid greasy, spicy, heavy foods. If nausea and/or vomiting occur, drink only clear liquids until the nausea and/or vomiting subsides. Call your physician if vomiting continues.  Special Instructions/Symptoms: Your throat may feel dry or sore from the anesthesia or the breathing tube placed in your throat during surgery. If this causes discomfort, gargle with warm salt water. The discomfort should disappear within 24 hours.  If you had a scopolamine patch placed behind your ear for the management of post- operative nausea and/or vomiting:  1. The medication in the patch is effective for 72 hours, after which it should be removed.  Wrap patch in a tissue and discard in the trash. Wash hands thoroughly with soap and water. 2. You may remove the patch earlier than 72 hours if you experience unpleasant side effects  which may include dry mouth, dizziness or visual disturbances. 3. Avoid touching the patch. Wash your hands with soap and water after contact with the patch.

## 2017-02-22 NOTE — Interval H&P Note (Signed)
History and Physical Interval Note:  02/22/2017 1:38 PM  Kayla Mendoza  has presented today for surgery, with the diagnosis of Back mass, right  The various methods of treatment have been discussed with the patient and family. After consideration of risks, benefits and other options for treatment, the patient has consented to  Procedure(s): EXCISION OF MASS RIGHT BACK (N/A) as a surgical intervention .  The patient's history has been reviewed, patient examined, no change in status, stable for surgery.  I have reviewed the patient's chart and labs.  Questions were answered to the patient's satisfaction.     Adin Hector

## 2017-02-22 NOTE — Anesthesia Postprocedure Evaluation (Signed)
Anesthesia Post Note  Patient: Kayla Mendoza  Procedure(s) Performed: Procedure(s) (LRB): EXCISION OF MASS RIGHT BACK (Right)  Patient location during evaluation: PACU Anesthesia Type: General Level of consciousness: awake and alert Pain management: pain level controlled Vital Signs Assessment: post-procedure vital signs reviewed and stable Respiratory status: spontaneous breathing, nonlabored ventilation and respiratory function stable Cardiovascular status: blood pressure returned to baseline and stable Postop Assessment: no signs of nausea or vomiting Anesthetic complications: no       Last Vitals:  Vitals:   02/22/17 1600 02/22/17 1636  BP: (!) 140/96 (!) 160/82  Pulse: 87 94  Resp: 16 18  Temp:  36.7 C    Last Pain:  Vitals:   02/22/17 1636  TempSrc:   PainSc: 2                  Lynda Rainwater

## 2017-02-22 NOTE — Anesthesia Procedure Notes (Signed)
Procedure Name: Intubation Date/Time: 02/22/2017 2:28 PM Performed by: Maryella Shivers Pre-anesthesia Checklist: Patient identified, Emergency Drugs available, Suction available and Patient being monitored Patient Re-evaluated:Patient Re-evaluated prior to inductionOxygen Delivery Method: Circle system utilized Preoxygenation: Pre-oxygenation with 100% oxygen Intubation Type: IV induction Ventilation: Mask ventilation without difficulty Laryngoscope Size: Mac and 3 Grade View: Grade I Tube type: Oral Tube size: 7.0 mm Number of attempts: 1 Airway Equipment and Method: Stylet and Oral airway Placement Confirmation: ETT inserted through vocal cords under direct vision,  positive ETCO2 and breath sounds checked- equal and bilateral Secured at: 20 cm Tube secured with: Tape Dental Injury: Teeth and Oropharynx as per pre-operative assessment

## 2017-02-23 ENCOUNTER — Encounter (HOSPITAL_BASED_OUTPATIENT_CLINIC_OR_DEPARTMENT_OTHER): Payer: Self-pay | Admitting: General Surgery

## 2017-02-23 ENCOUNTER — Ambulatory Visit (INDEPENDENT_AMBULATORY_CARE_PROVIDER_SITE_OTHER): Payer: Medicaid Other | Admitting: Rheumatology

## 2017-02-23 DIAGNOSIS — M170 Bilateral primary osteoarthritis of knee: Secondary | ICD-10-CM

## 2017-02-23 DIAGNOSIS — M25562 Pain in left knee: Secondary | ICD-10-CM | POA: Diagnosis not present

## 2017-02-23 DIAGNOSIS — G8929 Other chronic pain: Secondary | ICD-10-CM

## 2017-02-23 DIAGNOSIS — M25561 Pain in right knee: Principal | ICD-10-CM

## 2017-02-23 MED ORDER — LIDOCAINE HCL 1 % IJ SOLN
1.5000 mL | INTRAMUSCULAR | Status: AC | PRN
  Administered 2017-02-23: 1.5 mL

## 2017-02-23 MED ORDER — SODIUM HYALURONATE (VISCOSUP) 20 MG/2ML IX SOSY
20.0000 mg | PREFILLED_SYRINGE | INTRA_ARTICULAR | Status: AC | PRN
  Administered 2017-02-23: 20 mg via INTRA_ARTICULAR

## 2017-02-23 NOTE — Progress Notes (Signed)
   Procedure Note  Patient: Kayla Mendoza             Date of Birth: 08/07/1976           MRN: [id number]             Visit Date: 02/23/2017  Procedures: Visit Diagnoses: No diagnosis found.  Large Joint Inj Date/Time: 02/23/2017 2:45 PM Performed by: Eliezer Lofts Authorized by: Eliezer Lofts   Consent Given by:  Patient Site marked: the procedure site was marked   Timeout: prior to procedure the correct patient, procedure, and site was verified   Indications:  Pain and joint swelling Location:  Knee Site:  R knee Prep: patient was prepped and draped in usual sterile fashion   Needle Size:  27 G Needle Length:  1.5 inches Approach:  Medial Ultrasound Guidance: No   Fluoroscopic Guidance: No   Arthrogram: No   Medications:  1.5 mL lidocaine 1 %; 20 mg Sodium Hyaluronate 20 MG/2ML Aspiration Attempted: Yes   Aspirate amount (mL):  0 Patient tolerance:  Patient tolerated the procedure well with no immediate complications  Euflex and #2 bilateral knees After the first injection, patient has 50% improvement to her knees.  Patient recently had a lipoma taken out by Wynn Maudlin this week. Patient is doing well post surgery. Large Joint Inj Date/Time: 02/23/2017 2:48 PM Performed by: Eliezer Lofts Authorized by: Eliezer Lofts   Consent Given by:  Patient Site marked: the procedure site was marked   Timeout: prior to procedure the correct patient, procedure, and site was verified   Indications:  Pain and joint swelling Location:  Knee Site:  L knee Prep: patient was prepped and draped in usual sterile fashion   Needle Size:  27 G Needle Length:  1.5 inches Approach:  Medial Ultrasound Guidance: No   Fluoroscopic Guidance: No   Arthrogram: No   Medications:  1.5 mL lidocaine 1 %; 20 mg Sodium Hyaluronate 20 MG/2ML Aspiration Attempted: Yes   Aspirate amount (mL):  0 Patient tolerance:  Patient tolerated the procedure well with no immediate complications  Euflex and #2 bilateral knees After the first injection, patient has 50% improvement to her knees.  Patient recently had a lipoma taken out by Wynn Maudlin this week. Patient is doing well post surgery.

## 2017-02-23 NOTE — Progress Notes (Signed)
Inform patient of Pathology report,.  Inform her that final pathology shows a benign lipoma.  I will discuss with her in detail at next office visit  hmi

## 2017-03-02 ENCOUNTER — Ambulatory Visit: Payer: Medicaid Other | Admitting: Rheumatology

## 2017-03-10 ENCOUNTER — Ambulatory Visit (INDEPENDENT_AMBULATORY_CARE_PROVIDER_SITE_OTHER): Payer: Medicaid Other | Admitting: Rheumatology

## 2017-03-10 DIAGNOSIS — M170 Bilateral primary osteoarthritis of knee: Secondary | ICD-10-CM

## 2017-03-10 DIAGNOSIS — M25562 Pain in left knee: Secondary | ICD-10-CM

## 2017-03-10 DIAGNOSIS — G8929 Other chronic pain: Secondary | ICD-10-CM

## 2017-03-10 DIAGNOSIS — M25561 Pain in right knee: Secondary | ICD-10-CM

## 2017-03-10 MED ORDER — SODIUM HYALURONATE (VISCOSUP) 20 MG/2ML IX SOSY
20.0000 mg | PREFILLED_SYRINGE | INTRA_ARTICULAR | Status: AC | PRN
  Administered 2017-03-10: 20 mg via INTRA_ARTICULAR

## 2017-03-10 MED ORDER — LIDOCAINE HCL 1 % IJ SOLN
2.0000 mL | INTRAMUSCULAR | Status: AC | PRN
Start: 2017-03-10 — End: 2017-03-10
  Administered 2017-03-10: 2 mL

## 2017-03-10 MED ORDER — LIDOCAINE HCL 1 % IJ SOLN
2.0000 mL | INTRAMUSCULAR | Status: AC | PRN
  Administered 2017-03-10: 2 mL

## 2017-03-10 NOTE — Progress Notes (Signed)
   Procedure Note  Patient: Kayla Mendoza             Date of Birth: 1976/09/21           MRN: [id number]             Visit Date: 03/10/2017  Procedures: Visit Diagnoses: No diagnosis found.  Large Joint Inj Date/Time: 03/10/2017 8:45 AM Performed by: Eliezer Lofts Authorized by: Eliezer Lofts   Consent Given by:  Patient Site marked: the procedure site was marked   Timeout: prior to procedure the correct patient, procedure, and site was verified   Indications:  Pain and joint swelling Location:  Knee Site:  R knee Prep: patient was prepped and draped in usual sterile fashion   Needle Size:  27 G Needle Length:  1.5 inches Approach:  Medial Ultrasound Guidance: No   Fluoroscopic Guidance: No   Arthrogram: No   Medications:  2 mL lidocaine 1 %; 20 mg Sodium Hyaluronate 20 MG/2ML Aspiration Attempted: Yes   Aspirate amount (mL):  0 Patient tolerance:  Patient tolerated the procedure well with no immediate complications  After second Euflex injection, patient has an improvement in the right knee by 75% and the left knee has an improvement by 90%.  Today, patient is getting her third Euflex injection to each knee. Large Joint Inj Date/Time: 03/10/2017 8:48 AM Performed by: Eliezer Lofts Authorized by: Eliezer Lofts   Consent Given by:  Patient Site marked: the procedure site was marked   Timeout: prior to procedure the correct patient, procedure, and site was verified   Indications:  Pain and joint swelling Location:  Knee Site:  L knee Prep: patient was prepped and draped in usual sterile fashion   Needle Size:  27 G Needle Length:  1.5 inches Approach:  Medial Ultrasound Guidance: No   Fluoroscopic Guidance: No   Arthrogram: No   Medications:  2 mL lidocaine 1 % Aspiration Attempted: Yes   Aspirate amount (mL):  0 Patient tolerance:  Patient tolerated the procedure well with no immediate complications  After second Euflex injection, patient has an  improvement in the right knee by 75% and the left knee has an improvement by 90%.  Today, patient is getting her third Euflex injection to each knee.

## 2017-03-31 ENCOUNTER — Other Ambulatory Visit: Payer: Self-pay

## 2017-03-31 DIAGNOSIS — R600 Localized edema: Secondary | ICD-10-CM

## 2017-05-01 ENCOUNTER — Telehealth: Payer: Self-pay

## 2017-05-01 NOTE — Telephone Encounter (Signed)
Called patient and she is aware of her new appt due to dr fs out of office

## 2017-05-24 ENCOUNTER — Encounter: Payer: Self-pay | Admitting: Vascular Surgery

## 2017-06-01 ENCOUNTER — Encounter: Payer: Medicaid Other | Admitting: Vascular Surgery

## 2017-06-01 ENCOUNTER — Ambulatory Visit (HOSPITAL_COMMUNITY): Payer: Medicaid Other

## 2017-06-15 NOTE — Progress Notes (Deleted)
Office Visit Note  Patient: Kayla Mendoza             Date of Birth: 09/15/1976           MRN: [id number]             PCP: Braulio Conte, FNP Referring: Braulio Conte, FNP Visit Date: 06/21/2017 Occupation: @GUAROCC @    Subjective:  No chief complaint on file.   History of Present Illness: Kayla Mendoza is a 41 y.o. female ***   Activities of Daily Living:  Patient reports morning stiffness for *** {minute/hour:19697}.   Patient {ACTIONS;DENIES/REPORTS:21021675::"Denies"} nocturnal pain.  Difficulty dressing/grooming: {ACTIONS;DENIES/REPORTS:21021675::"Denies"} Difficulty climbing stairs: {ACTIONS;DENIES/REPORTS:21021675::"Denies"} Difficulty getting out of chair: {ACTIONS;DENIES/REPORTS:21021675::"Denies"} Difficulty using hands for taps, buttons, cutlery, and/or writing: {ACTIONS;DENIES/REPORTS:21021675::"Denies"}   No Rheumatology ROS completed.   PMFS History:  Patient Active Problem List   Diagnosis Date Noted  . Lipoma of back 02/22/2017  . Fibromyalgia syndrome 11/10/2016  . Other fatigue 11/10/2016  . Primary insomnia 11/10/2016  . Primary osteoarthritis of both knees 11/10/2016  . Primary osteoarthritis of both hands 11/10/2016  . Vitamin D deficiency 11/10/2016  . DDD (degenerative disc disease), lumbar 11/10/2016  . History of anxiety 11/10/2016  . History of restless legs syndrome 11/10/2016  . Pelvic pain in female 04/17/2014  . Back pain 07/23/2013  . Lumbar radiculopathy 07/23/2013  . Migraine without aura, with intractable migraine, so stated, without mention of status migrainosus 06/28/2013  . Tension headache 06/28/2013  . HYPERTHYROIDISM 10/27/2010  . GENERALIZED ANXIETY DISORDER 10/27/2010  . GERD 10/27/2010  . ANKLE SPRAIN, LEFT 10/27/2010    Past Medical History:  Diagnosis Date  . Abnormal uterine bleeding (AUB)   . Anxiety   . Arthritis of back   . Bladder neoplasm   . DDD (degenerative disc disease), lumbar   . Depression    . Fibromyalgia   . GERD (gastroesophageal reflux disease)   . H/O hiatal hernia   . Migraines   . Nocturia   . Pelvic pain in female   . Pinched vertebral nerve   . RLS (restless legs syndrome)   . Scoliosis   . Sleep apnea   . Urgency of urination     Family History  Problem Relation Age of Onset  . Diabetes Paternal Grandmother   . Diabetes Paternal Grandfather   . Hypertension Maternal Grandfather    Past Surgical History:  Procedure Laterality Date  . ABDOMINAL HYSTERECTOMY    . CYSTOSCOPY N/A 04/17/2014   Procedure: CYSTOSCOPY;  Surgeon: [name], MD;  Location: [facility];  Service: Gynecology;  Laterality: N/A;  clean/contaminated  . CYSTOSCOPY WITH BIOPSY N/A 03/08/2014   Procedure: CYSTOSCOPY WITH  TURBT SMALL HYDROTENSION OF BLADDER INSTALLATION OF MARCAINE AND  PYRIDIUM;  Surgeon: [name], MD;  Location: [facility];  Service: Urology;  Laterality: N/A;  . ESSURE TUBAL LIGATION  2010  . EXCISION OF BACK LESION Right 02/22/2017   Procedure: EXCISION OF MASS RIGHT BACK;  Surgeon: [name], MD;  Location: [facility];  Service: General;  Laterality: Right;  . LAPAROSCOPIC ASSISTED VAGINAL HYSTERECTOMY Bilateral 04/17/2014   Procedure: LAPAROSCOPIC ASSISTED VAGINAL HYSTERECTOMY, Bilateral Salpingectomy;  Surgeon: [name], MD;  Location: [facility];  Service: Gynecology;  Laterality: Bilateral;  clean/contaminated  . NEGATIVE SLEEP STUDY  2013   Social History   Social History Narrative   Patient is divorced.   Patient lives at home with her 4 children.    Patient  has a college degree.   Patient is right-handed.   Patient drinks very little caffeine-coffee.              Objective: Vital Signs: LMP 03/28/2014    Physical Exam   Musculoskeletal Exam: ***  CDAI Exam: No CDAI exam completed.    Investigation: No additional findings. CBC Latest Ref Rng & Units 02/18/2017 06/18/2016 06/19/2015  WBC 4.0 - 10.5 K/uL  11.1(H) 6.0 5.1  Hemoglobin 12.0 - 15.0 g/dL 11.5(L) 12.8 12.8  Hematocrit 36.0 - 46.0 % 34.6(L) 39.5 38.9  Platelets 150 - 400 K/uL 372 314 245   CMP Latest Ref Rng & Units 02/18/2017 06/18/2016 06/18/2016  Glucose 65 - 99 mg/dL 84 100 -  BUN 6 - 20 mg/dL 5(L) 5.9(L) -  Creatinine 0.44 - 1.00 mg/dL 0.76 0.9 -  Sodium 135 - 145 mmol/L 138 143 -  Potassium 3.5 - 5.1 mmol/L 4.0 4.1 -  Chloride 101 - 111 mmol/L 100(L) - -  CO2 22 - 32 mmol/L 28 27 -  Calcium 8.9 - 10.3 mg/dL 8.8(L) 9.4 -  Total Protein 6.5 - 8.1 g/dL 8.1 7.8 7.1  Total Bilirubin 0.3 - 1.2 mg/dL 0.6 0.60 -  Alkaline Phos 38 - 126 U/L 118 117 -  AST 15 - 41 U/L 20 20 -  ALT 14 - 54 U/L 16 11 -    Imaging: No results found.  Speciality Comments: No specialty comments available.    Procedures:  No procedures performed Allergies: Neurontin [gabapentin]; Shellfish allergy; and Tramadol   Assessment / Plan:     Visit Diagnoses: Fibromyalgia syndrome  Other fatigue  Primary insomnia  Primary osteoarthritis of both hands  Primary osteoarthritis of both knees  DDD (degenerative disc disease), lumbar  Vitamin D deficiency  History of anxiety  History of restless legs syndrome  History of migraine  History of gastroesophageal reflux (GERD)  History of hyperthyroidism    Orders: No orders of the defined types were placed in this encounter.  No orders of the defined types were placed in this encounter.   Face-to-face time spent with patient was *** minutes. 50% of time was spent in counseling and coordination of care.  Follow-Up Instructions: No Follow-up on file.   Senovia Gauer, RT  Note - This record has been created using [employer].  Chart creation errors have been sought, but may not always  have been located. Such creation errors do not reflect on  the standard of medical care.

## 2017-06-17 ENCOUNTER — Other Ambulatory Visit: Payer: Medicaid Other

## 2017-06-17 ENCOUNTER — Ambulatory Visit: Payer: Medicaid Other | Admitting: Oncology

## 2017-06-20 ENCOUNTER — Encounter (HOSPITAL_BASED_OUTPATIENT_CLINIC_OR_DEPARTMENT_OTHER): Payer: Self-pay

## 2017-06-20 ENCOUNTER — Emergency Department (HOSPITAL_BASED_OUTPATIENT_CLINIC_OR_DEPARTMENT_OTHER)
Admission: EM | Admit: 2017-06-20 | Discharge: 2017-06-21 | Disposition: A | Discharge status: Home / Self Care | Payer: Medicaid Other | Attending: Emergency Medicine | Admitting: Emergency Medicine

## 2017-06-20 DIAGNOSIS — Z79899 Other long term (current) drug therapy: Secondary | ICD-10-CM | POA: Diagnosis not present

## 2017-06-20 DIAGNOSIS — G43409 Hemiplegic migraine, not intractable, without status migrainosus: Secondary | ICD-10-CM | POA: Diagnosis not present

## 2017-06-20 DIAGNOSIS — R51 Headache: Secondary | ICD-10-CM | POA: Diagnosis present

## 2017-06-20 MED ORDER — METOCLOPRAMIDE HCL 5 MG/ML IJ SOLN
10.0000 mg | Freq: Once | INTRAMUSCULAR | Status: AC
  Administered 2017-06-21: 10 mg via INTRAVENOUS
  Filled 2017-06-20: qty 2

## 2017-06-20 MED ORDER — DIPHENHYDRAMINE HCL 50 MG/ML IJ SOLN
25.0000 mg | Freq: Once | INTRAMUSCULAR | Status: AC
  Administered 2017-06-21: 25 mg via INTRAVENOUS
  Filled 2017-06-20: qty 1

## 2017-06-20 MED ORDER — KETOROLAC TROMETHAMINE 15 MG/ML IJ SOLN
15.0000 mg | Freq: Once | INTRAMUSCULAR | Status: AC
  Administered 2017-06-21: 15 mg via INTRAVENOUS
  Filled 2017-06-20: qty 1

## 2017-06-20 MED ORDER — SODIUM CHLORIDE 0.9 % IV BOLUS (SEPSIS)
1000.0000 mL | Freq: Once | INTRAVENOUS | Status: AC
  Administered 2017-06-21: 1000 mL via INTRAVENOUS

## 2017-06-20 NOTE — ED Triage Notes (Signed)
C/o HA x 2 days-checked BP today was 164/108-denies hx of HTN-NAD-steady gait

## 2017-06-20 NOTE — ED Provider Notes (Signed)
[facility] Provider Note: Georgena Spurling, MD, FACEP  CSN: [id number] MRN: [id number] ARRIVAL: 06/20/17 at 2231 ROOM: [place]  Headache   HISTORY OF PRESENT ILLNESS  06/20/17 11:51 PM Kayla Mendoza is a 41 y.o. female with a history of migraines. She is here with a headache for 2 days. The headache is located frontally and she describes it as a pressure. She rates her pain as a 7 out of 10. Symptoms are similar to previous migraines but more severe. She has had associated photophobia but no nausea or vomiting. She has taken ibuprofen and vinegar without relief.   Past Medical History:  Diagnosis Date  . Abnormal uterine bleeding (AUB)   . Anxiety   . Arthritis of back   . Bladder neoplasm   . DDD (degenerative disc disease), lumbar   . Depression   . Fibromyalgia   . GERD (gastroesophageal reflux disease)   . H/O hiatal hernia   . Migraines   . Nocturia   . Pelvic pain in female   . Pinched vertebral nerve   . RLS (restless legs syndrome)   . Scoliosis   . Sleep apnea   . Urgency of urination     Past Surgical History:  Procedure Laterality Date  . ABDOMINAL HYSTERECTOMY    . CYSTOSCOPY N/A 04/17/2014   Procedure: CYSTOSCOPY;  Surgeon: [name], MD;  Location: [facility];  Service: Gynecology;  Laterality: N/A;  clean/contaminated  . CYSTOSCOPY WITH BIOPSY N/A 03/08/2014   Procedure: CYSTOSCOPY WITH  TURBT SMALL HYDROTENSION OF BLADDER INSTALLATION OF MARCAINE AND  PYRIDIUM;  Surgeon: [name], MD;  Location: [facility];  Service: Urology;  Laterality: N/A;  . ESSURE TUBAL LIGATION  2010  . EXCISION OF BACK LESION Right 02/22/2017   Procedure: EXCISION OF MASS RIGHT BACK;  Surgeon: [name], MD;  Location: [facility];  Service: General;  Laterality: Right;  . LAPAROSCOPIC ASSISTED VAGINAL HYSTERECTOMY Bilateral 04/17/2014   Procedure: LAPAROSCOPIC ASSISTED VAGINAL HYSTERECTOMY, Bilateral  Salpingectomy;  Surgeon: [name], MD;  Location: [facility];  Service: Gynecology;  Laterality: Bilateral;  clean/contaminated  . LIPOMA EXCISION    . NEGATIVE SLEEP STUDY  2013    Family History  Problem Relation Age of Onset  . Diabetes Paternal Grandmother   . Diabetes Paternal Grandfather   . Hypertension Maternal Grandfather     Social History  Substance Use Topics  . Smoking status: Never Smoker  . Smokeless tobacco: Never Used  . Alcohol use No    Prior to Admission medications   Medication Sig Start Date End Date Taking? Authorizing Provider  buPROPion (WELLBUTRIN XL) 300 MG 24 hr tablet Take 300 mg by mouth daily.   Yes [provider]  ALPRAZolam Duanne Moron) 1 MG tablet Take 1 mg by mouth 3 (three) times daily as needed for anxiety.    [provider]  diclofenac sodium (VOLTAREN) 1 % GEL Apply 4 g topically 4 (four) times daily. 12/17/16   Panwala, Naitik, PA-C  diphenhydrAMINE (BENADRYL) 25 MG tablet Take 25 mg by mouth. 09/15/16   [provider]  docusate sodium (COLACE) 100 MG capsule Take 100 mg by mouth daily as needed for mild constipation.    [provider]  DULoxetine (CYMBALTA) 60 MG capsule Take 120 mg by mouth daily.    [provider]  furosemide (LASIX) 20 MG tablet TAKE 1 TABLET EVERY DAY AS NEEDED FOR SWELLING 08/24/16   [provider]  metoCLOPramide (REGLAN) 10 MG tablet Take 10 mg by mouth 4 (four) times daily.    [provider]  Pantoprazole Sodium (PROTONIX PO) Take 40 mg by mouth daily.    [provider]  QUEtiapine (SEROQUEL) 400 MG tablet Take 400 mg by mouth at bedtime.    [provider]  valACYclovir (VALTREX) 1000 MG tablet Take 1,000 mg by mouth daily.    [provider]    Allergies Neurontin [gabapentin]; Shellfish allergy; and Tramadol   REVIEW OF SYSTEMS  Negative except as noted here or in the History of Present Illness.   PHYSICAL  EXAMINATION  Initial Vital Signs Blood pressure (!) 143/99, pulse 88, temperature 98.3 F (36.8 C), temperature source Oral, resp. rate 16, height 5\' 3"  (1.6 m), weight 105 kg (231 lb 7.7 oz), last menstrual period 03/28/2014, SpO2 100 %.  Examination General: Well-developed, well-nourished female in no acute distress; appearance consistent with age of record HENT: normocephalic; atraumatic Eyes: pupils equal, round and reactive to light; extraocular muscles intact; photophobia Neck: supple Heart: regular rate and rhythm Lungs: clear to auscultation bilaterally Abdomen: soft; nondistended; nontender; bowel sounds present Extremities: No deformity; full range of motion; pulses normal Neurologic: Awake, alert and oriented; motor function intact in all extremities and symmetric; no facial droop; normal coordination and speech Skin: Warm and dry Psychiatric: Flat affect   RESULTS  Summary of this visit's results, reviewed by myself:   EKG Interpretation  Date/Time:    Ventricular Rate:    PR Interval:    QRS Duration:   QT Interval:    QTC Calculation:   R Axis:     Text Interpretation:        Laboratory Studies: No results found for this or any previous visit (from the past 24 hour(s)). Imaging Studies: No results found.  ED COURSE  Nursing notes and initial vitals signs, including pulse oximetry, reviewed.  Vitals:   06/20/17 2240  BP: (!) 143/99  Pulse: 88  Resp: 16  Temp: 98.3 F (36.8 C)  TempSrc: Oral  SpO2: 100%  Weight: 105 kg (231 lb 7.7 oz)  Height: 5\' 3"  (1.6 m)   1:08 AM Headache resolved after IV fluids and medications.  PROCEDURES    ED DIAGNOSES     ICD-10-CM   1. Sporadic migraine G43.[place]lin [place], MD 06/21/17 [phone]

## 2017-06-20 NOTE — ED Notes (Signed)
ED Provider at bedside. 

## 2017-06-21 ENCOUNTER — Ambulatory Visit: Payer: Medicaid Other | Admitting: Rheumatology

## 2017-06-23 ENCOUNTER — Ambulatory Visit (HOSPITAL_BASED_OUTPATIENT_CLINIC_OR_DEPARTMENT_OTHER): Payer: Medicaid Other | Admitting: Oncology

## 2017-06-23 ENCOUNTER — Other Ambulatory Visit (HOSPITAL_BASED_OUTPATIENT_CLINIC_OR_DEPARTMENT_OTHER): Payer: Medicaid Other

## 2017-06-23 ENCOUNTER — Telehealth: Payer: Self-pay | Admitting: Oncology

## 2017-06-23 VITALS — BP 140/100 | HR 79 | Temp 98.5°F | Resp 20 | Ht 63.0 in | Wt 236.4 lb

## 2017-06-23 DIAGNOSIS — D472 Monoclonal gammopathy: Secondary | ICD-10-CM

## 2017-06-23 DIAGNOSIS — M898X9 Other specified disorders of bone, unspecified site: Secondary | ICD-10-CM

## 2017-06-23 LAB — CBC WITH DIFFERENTIAL/PLATELET
BASO%: 1 % (ref 0.0–2.0)
Basophils Absolute: 0.1 10*3/uL (ref 0.0–0.1)
EOS%: 6.7 % (ref 0.0–7.0)
Eosinophils Absolute: 0.4 10*3/uL (ref 0.0–0.5)
HCT: 35.6 % (ref 34.8–46.6)
HGB: 12 g/dL (ref 11.6–15.9)
LYMPH#: 1.5 10*3/uL (ref 0.9–3.3)
LYMPH%: 26.7 % (ref 14.0–49.7)
MCH: 28.8 pg (ref 25.1–34.0)
MCHC: 33.6 g/dL (ref 31.5–36.0)
MCV: 85.7 fL (ref 79.5–101.0)
MONO#: 0.3 10*3/uL (ref 0.1–0.9)
MONO%: 4.9 % (ref 0.0–14.0)
NEUT%: 60.7 % (ref 38.4–76.8)
NEUTROS ABS: 3.4 10*3/uL (ref 1.5–6.5)
PLATELETS: 285 10*3/uL (ref 145–400)
RBC: 4.15 10*6/uL (ref 3.70–5.45)
RDW: 14 % (ref 11.2–14.5)
WBC: 5.5 10*3/uL (ref 3.9–10.3)

## 2017-06-23 LAB — COMPREHENSIVE METABOLIC PANEL
ALT: 11 U/L (ref 0–55)
ANION GAP: 8 meq/L (ref 3–11)
AST: 17 U/L (ref 5–34)
Albumin: 3.5 g/dL (ref 3.5–5.0)
Alkaline Phosphatase: 116 U/L (ref 40–150)
BILIRUBIN TOTAL: 0.4 mg/dL (ref 0.20–1.20)
BUN: 8 mg/dL (ref 7.0–26.0)
CHLORIDE: 104 meq/L (ref 98–109)
CO2: 28 meq/L (ref 22–29)
CREATININE: 0.8 mg/dL (ref 0.6–1.1)
Calcium: 9 mg/dL (ref 8.4–10.4)
EGFR: >90 mL/min/{1.73_m2} (ref >90)
Glucose: 103 mg/dl (ref 70–140)
Potassium: 3.9 mEq/L (ref 3.5–5.1)
Sodium: 140 mEq/L (ref 136–145)
TOTAL PROTEIN: 7.4 g/dL (ref 6.4–8.3)
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
## 2017-06-23 NOTE — Telephone Encounter (Signed)
Gave patient avs report and appointments for September 2019.

## 2017-06-24 LAB — KAPPA/LAMBDA LIGHT CHAINS
IG KAPPA FREE LIGHT CHAIN: 26.3 mg/L — AB (ref 3.3–19.4)
Ig Lambda Free Light Chain: 13.2 mg/L (ref 5.7–26.3)
Kappa/Lambda FluidC Ratio: 1.99 — ABNORMAL HIGH (ref 0.26–1.65)

## 2017-06-27 LAB — MULTIPLE MYELOMA PANEL, SERUM
ALBUMIN SERPL ELPH-MCNC: 3.8 g/dL (ref 2.9–4.4)
ALPHA2 GLOB SERPL ELPH-MCNC: 0.7 g/dL (ref 0.4–1.0)
Albumin/Glob SerPl: 1.2 (ref 0.7–1.7)
Alpha 1: 0.2 g/dL (ref 0.0–0.4)
B-GLOBULIN SERPL ELPH-MCNC: 1 g/dL (ref 0.7–1.3)
Gamma Glob SerPl Elph-Mcnc: 1.5 g/dL (ref 0.4–1.8)
Globulin, Total: 3.4 g/dL (ref 2.2–3.9)
IgA, Qn, Serum: 110 mg/dL (ref 87–352)
IgM, Qn, Serum: 79 mg/dL (ref 26–217)
M PROTEIN SERPL ELPH-MCNC: 0.8 g/dL — AB
TOTAL PROTEIN: 7.2 g/dL (ref 6.0–8.5)

## 2017-07-27 ENCOUNTER — Encounter (HOSPITAL_COMMUNITY): Payer: Medicaid Other

## 2017-07-27 ENCOUNTER — Encounter: Payer: Medicaid Other | Admitting: Vascular Surgery

## 2017-09-14 DIAGNOSIS — I10 Essential (primary) hypertension: Secondary | ICD-10-CM | POA: Insufficient documentation

## 2017-09-14 DIAGNOSIS — F419 Anxiety disorder, unspecified: Secondary | ICD-10-CM | POA: Insufficient documentation

## 2017-09-14 DIAGNOSIS — R002 Palpitations: Secondary | ICD-10-CM | POA: Insufficient documentation

## 2018-01-15 ENCOUNTER — Ambulatory Visit (HOSPITAL_COMMUNITY)
Admission: EM | Admit: 2018-01-15 | Discharge: 2018-01-15 | Disposition: A | Discharge status: Home / Self Care | Payer: Medicaid Other

## 2018-01-15 ENCOUNTER — Encounter (HOSPITAL_COMMUNITY): Payer: Self-pay | Admitting: Emergency Medicine

## 2018-01-15 ENCOUNTER — Ambulatory Visit (INDEPENDENT_AMBULATORY_CARE_PROVIDER_SITE_OTHER): Payer: Medicaid Other

## 2018-01-15 ENCOUNTER — Other Ambulatory Visit: Payer: Self-pay

## 2018-01-15 DIAGNOSIS — J181 Lobar pneumonia, unspecified organism: Secondary | ICD-10-CM

## 2018-01-15 DIAGNOSIS — R059 Cough, unspecified: Secondary | ICD-10-CM

## 2018-01-15 DIAGNOSIS — R509 Fever, unspecified: Secondary | ICD-10-CM

## 2018-01-15 DIAGNOSIS — R05 Cough: Secondary | ICD-10-CM

## 2018-01-15 DIAGNOSIS — J189 Pneumonia, unspecified organism: Secondary | ICD-10-CM

## 2018-01-15 LAB — POCT URINALYSIS DIP (DEVICE)
BILIRUBIN URINE: NEGATIVE
Glucose, UA: NEGATIVE mg/dL
HGB URINE DIPSTICK: NEGATIVE
LEUKOCYTES UA: NEGATIVE
Nitrite: NEGATIVE
Protein, ur: 30 mg/dL — AB
SPECIFIC GRAVITY, URINE: 1.025 (ref 1.005–1.030)
Urobilinogen, UA: 1 mg/dL (ref 0.0–1.0)
pH: 5.5 (ref 5.0–8.0)

## 2018-01-15 LAB — POCT I-STAT, CHEM 8
BUN: 6 mg/dL (ref 6–20)
Calcium, Ion: 1.15 mmol/L (ref 1.15–1.40)
Chloride: 98 mmol/L — ABNORMAL LOW (ref 101–111)
Creatinine, Ser: 0.9 mg/dL (ref 0.44–1.00)
Glucose, Bld: 157 mg/dL — ABNORMAL HIGH (ref 65–99)
HEMATOCRIT: 42 % (ref 36.0–46.0)
Hemoglobin: 14.3 g/dL (ref 12.0–15.0)
Potassium: 4 mmol/L (ref 3.5–5.1)
SODIUM: 138 mmol/L (ref 135–145)
TCO2: 26 mmol/L (ref 22–32)

## 2018-01-15 MED ORDER — NAPROXEN 500 MG PO TABS: 500.0000 mg | ORAL_TABLET | Freq: Two times a day (BID) | ORAL | 0 refills | Status: DC

## 2018-01-15 MED ORDER — LEVOFLOXACIN 750 MG PO TABS: 750.0000 mg | ORAL_TABLET | Freq: Every day | ORAL | 0 refills | Status: DC

## 2018-01-15 NOTE — ED Provider Notes (Addendum)
01/15/2018 2:43 PM   DOB: 1975/11/06 / MRN: [id number]  SUBJECTIVE:  Kayla Mendoza is a 42 y.o. 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  Tells me she was diagnosed with the flu 1 week ago and was given Marlis Edelson reports that she felt much better 3-4 days later and was able to go back to work on Friday (2 days ago).  Tells me she has a "nasty cough" splitting headache at this time.  Denies weakness in the extremities, shortness of breath, DOE, chest pain, abdominal pain, dysuria.  Denies leg swelling and posterior calf pain.  She has had a hysterectomy.  She is allergic to neurontin [gabapentin]; shellfish allergy; and tramadol.   She  has a past medical history of Abnormal uterine bleeding (AUB), Anxiety, Arthritis of back, Bladder neoplasm, DDD (degenerative disc disease), lumbar, Depression, Fibromyalgia, GERD (gastroesophageal reflux disease), H/O hiatal hernia, Migraines, Nocturia, Pelvic pain in female, Pinched vertebral nerve, RLS (restless legs syndrome), Scoliosis, Sleep apnea, and Urgency of urination.    She  reports that she has never smoked. She has never used smokeless tobacco. She reports that she does not drink alcohol or use drugs. She  has no sexual activity history on file. The patient  has a past surgical history that includes Essure tubal ligation (2010); NEGATIVE SLEEP STUDY (2013); Cystoscopy with biopsy (N/A, 03/08/2014); Laparoscopic assisted vaginal hysterectomy (Bilateral, 04/17/2014); Cystoscopy (N/A, 04/17/2014); Abdominal hysterectomy; Excision of back lesion (Right, 02/22/2017); and Lipoma excision.  Her family history includes Diabetes in her paternal grandfather and paternal grandmother; Hypertension in her maternal grandfather.  Review of Systems  Constitutional: Negative for chills, diaphoresis and fever.  Respiratory: Negative for shortness of breath.   Cardiovascular: Negative for chest pain, orthopnea and leg swelling.   Gastrointestinal: Negative for nausea.  Skin: Negative for rash.  Neurological: Negative for dizziness.    OBJECTIVE:  BP 109/76 (BP Location: Right Arm)   Pulse 95   Temp (!) 100.6 F (38.1 C) (Oral)   Resp 18   LMP 03/28/2014   SpO2 99%   Wt Readings from Last 3 Encounters:  06/23/17 236 lb 6.4 oz (107.2 kg)  06/20/17 231 lb 7.7 oz (105 kg)  02/22/17 240 lb (108.9 kg)   Temp Readings from Last 3 Encounters:  01/15/18 (!) 100.6 F (38.1 C) (Oral)  06/23/17 98.5 F (36.9 C) (Oral)  06/20/17 98.3 F (36.8 C) (Oral)   BP Readings from Last 3 Encounters:  01/15/18 109/76  06/23/17 (!) 140/100  06/21/17 116/69   Pulse Readings from Last 3 Encounters:  01/15/18 95  06/23/17 79  06/21/17 92     Physical Exam  Constitutional:  Non-toxic appearance. She does not have a sickly appearance. She appears ill. No distress.  Cardiovascular: Normal rate and regular rhythm.  Pulmonary/Chest: Effort normal and breath sounds normal. No respiratory distress. She has no wheezes. She has no rales. She exhibits no tenderness.  Abdominal: Soft.    Results for orders placed or performed during the hospital encounter of 01/15/18 (from the past 72 hour(s))  I-STAT, chem 8     Status: Abnormal   Collection Time: 01/15/18  1:25 PM  Result Value Ref Range   Sodium 138 135 - 145 mmol/L   Potassium 4.0 3.5 - 5.1 mmol/L   Chloride 98 (L) 101 - 111 mmol/L   BUN 6 6 - 20 mg/dL   Creatinine, Ser 0.90 0.44 - 1.00 mg/dL   Glucose, Bld 157 (H) 65 -  99 mg/dL   Calcium, Ion 1.15 1.15 - 1.40 mmol/L   TCO2 26 22 - 32 mmol/L   Hemoglobin 14.3 12.0 - 15.0 g/dL   HCT 42.0 36.0 - 46.0 %  POCT urinalysis dip (device)     Status: Abnormal   Collection Time: 01/15/18  1:27 PM  Result Value Ref Range   Glucose, UA NEGATIVE NEGATIVE mg/dL   Bilirubin Urine NEGATIVE NEGATIVE   Ketones, ur TRACE (A) NEGATIVE mg/dL   Specific Gravity, Urine 1.025 1.005 - 1.030   Hgb urine dipstick NEGATIVE NEGATIVE    pH 5.5 5.0 - 8.0   Protein, ur 30 (A) NEGATIVE mg/dL   Urobilinogen, UA 1.0 0.0 - 1.0 mg/dL   Nitrite NEGATIVE NEGATIVE   Leukocytes, UA NEGATIVE NEGATIVE    Comment: Biochemical Testing Only. Please order routine urinalysis from main lab if confirmatory testing is needed.    Dg Chest 2 View  Result Date: 01/15/2018 CLINICAL DATA:  Cough and fever for 1 week. EXAM: CHEST - 2 VIEW COMPARISON:  04/20/2014 and prior radiographs FINDINGS: Airspace disease within the SUPERIOR segment LEFT LOWER lobe noted compatible with pneumonia. The cardiomediastinal silhouette is unremarkable. Mild peribronchial thickening again noted. No evidence of pleural effusion, pneumothorax or acute bony abnormality. IMPRESSION: LEFT LOWER lobe airspace disease compatible with pneumonia. No evidence of pleural effusion. Electronically Signed   By: Margarette Canada M.D.   On: 01/15/2018 14:21    ASSESSMENT AND PLAN:  Orders Placed This Encounter  Procedures  . DG Chest 2 View    Standing Status:   Standing    Number of Occurrences:   1    Order Specific Question:   Reason for Exam (SYMPTOM  OR DIAGNOSIS REQUIRED)    Answer:   Cough.  Flu one week ago with double sickening.  . I-STAT, chem 8    Standing Status:   Standing    Number of Occurrences:   1  . POCT urinalysis dip (device)    Standing Status:   Standing    Number of Occurrences:   1     Cough  Fever, unspecified fever cause  Pneumonia of left lower lobe due to infectious organism [facility]): See rads.  Vital signs stable.  Starting Levaquin and advising that she come back here for recheck in the next 3 to 4 days.  Have given her naproxen for headache as well.      The patient is advised to call or return to clinic if she does not see an improvement in symptoms, or to seek the care of the closest emergency department if she worsens with the above plan.   Philis Fendt, MHS, PA-C 01/15/2018 2:43 PM    [name], PA-C 01/15/18 1445    [name], PA-C 01/15/18 1446

## 2018-01-15 NOTE — ED Triage Notes (Signed)
Started feeling bad on Monday and saw your pcp on Monday.  Patient says she was prescribed samples of a medicine for the flu, not tamiflu.  Patient said she felt better, only cough lingered.  Last night symptoms worsened with cough increasing, body aches, fever.

## 2018-01-15 NOTE — Discharge Instructions (Signed)
X-rays show that you have a pneumonia.  I am going to treat you with a medicine called Levaquin for 5 days.  I would like for you to come back here for recheck in 3 days.  If at any point you feel like you are getting worse please come back here or go straight to the emergency department I prescribe some medicine to help you with pain and headache as well.

## 2018-01-17 ENCOUNTER — Other Ambulatory Visit: Payer: Self-pay

## 2018-01-17 ENCOUNTER — Emergency Department (HOSPITAL_COMMUNITY): Payer: Medicaid Other

## 2018-01-17 ENCOUNTER — Emergency Department (HOSPITAL_COMMUNITY)
Admission: EM | Admit: 2018-01-17 | Discharge: 2018-01-17 | Disposition: A | Discharge status: Home / Self Care | Payer: Medicaid Other | Attending: Emergency Medicine | Admitting: Emergency Medicine

## 2018-01-17 ENCOUNTER — Encounter (HOSPITAL_COMMUNITY): Payer: Self-pay | Admitting: Emergency Medicine

## 2018-01-17 ENCOUNTER — Encounter (HOSPITAL_COMMUNITY): Payer: Self-pay

## 2018-01-17 ENCOUNTER — Ambulatory Visit (HOSPITAL_COMMUNITY)
Admission: EM | Admit: 2018-01-17 | Discharge: 2018-01-17 | Disposition: A | Discharge status: Home / Self Care | Payer: Medicaid Other | Attending: Family Medicine | Admitting: Family Medicine

## 2018-01-17 DIAGNOSIS — Z79899 Other long term (current) drug therapy: Secondary | ICD-10-CM | POA: Diagnosis not present

## 2018-01-17 DIAGNOSIS — J181 Lobar pneumonia, unspecified organism: Secondary | ICD-10-CM | POA: Insufficient documentation

## 2018-01-17 DIAGNOSIS — J189 Pneumonia, unspecified organism: Secondary | ICD-10-CM

## 2018-01-17 DIAGNOSIS — Z8551 Personal history of malignant neoplasm of bladder: Secondary | ICD-10-CM | POA: Diagnosis not present

## 2018-01-17 DIAGNOSIS — R509 Fever, unspecified: Secondary | ICD-10-CM | POA: Insufficient documentation

## 2018-01-17 DIAGNOSIS — R0609 Other forms of dyspnea: Secondary | ICD-10-CM | POA: Diagnosis not present

## 2018-01-17 DIAGNOSIS — R042 Hemoptysis: Secondary | ICD-10-CM

## 2018-01-17 LAB — CBC WITH DIFFERENTIAL/PLATELET
BASOS PCT: 0 %
Basophils Absolute: 0 10*3/uL (ref 0.0–0.1)
EOS ABS: 0.2 10*3/uL (ref 0.0–0.7)
Eosinophils Relative: 2 %
HEMATOCRIT: 37.4 % (ref 36.0–46.0)
HEMOGLOBIN: 12.2 g/dL (ref 12.0–15.0)
Lymphocytes Relative: 23 %
Lymphs Abs: 2.5 10*3/uL (ref 0.7–4.0)
MCH: 28.6 pg (ref 26.0–34.0)
MCHC: 32.6 g/dL (ref 30.0–36.0)
MCV: 87.6 fL (ref 78.0–100.0)
MONO ABS: 0.6 10*3/uL (ref 0.1–1.0)
MONOS PCT: 6 %
Neutro Abs: 7.6 10*3/uL (ref 1.7–7.7)
Neutrophils Relative %: 69 %
Platelets: 324 10*3/uL (ref 150–400)
RBC: 4.27 MIL/uL (ref 3.87–5.11)
RDW: 14.7 % (ref 11.5–15.5)
WBC: 11 10*3/uL — ABNORMAL HIGH (ref 4.0–10.5)

## 2018-01-17 LAB — COMPREHENSIVE METABOLIC PANEL
ALBUMIN: 3.2 g/dL — AB (ref 3.5–5.0)
ALK PHOS: 90 U/L (ref 38–126)
ALT: 14 U/L (ref 14–54)
ANION GAP: 9 (ref 5–15)
AST: 16 U/L (ref 15–41)
BILIRUBIN TOTAL: 0.6 mg/dL (ref 0.3–1.2)
BUN: 12 mg/dL (ref 6–20)
CALCIUM: 8.3 mg/dL — AB (ref 8.9–10.3)
CO2: 26 mmol/L (ref 22–32)
Chloride: 102 mmol/L (ref 101–111)
Creatinine, Ser: 1.19 mg/dL — ABNORMAL HIGH (ref 0.44–1.00)
GFR calc non Af Amer: 55 mL/min — ABNORMAL LOW (ref >60)
GLUCOSE: 113 mg/dL — AB (ref 65–99)
POTASSIUM: 3.6 mmol/L (ref 3.5–5.1)
Sodium: 137 mmol/L (ref 135–145)
TOTAL PROTEIN: 7.7 g/dL (ref 6.5–8.1)

## 2018-01-17 MED ORDER — IOPAMIDOL (ISOVUE-370) INJECTION 76%
INTRAVENOUS | Status: AC
  Filled 2018-01-17: qty 100

## 2018-01-17 MED ORDER — HYDROCODONE-HOMATROPINE 5-1.5 MG/5ML PO SYRP: 5.0000 mL | ORAL_SOLUTION | Freq: Four times a day (QID) | ORAL | 0 refills | Status: DC | PRN

## 2018-01-17 MED ORDER — BENZONATATE 100 MG PO CAPS: 100.0000 mg | ORAL_CAPSULE | Freq: Three times a day (TID) | ORAL | 0 refills | Status: DC

## 2018-01-17 MED ORDER — IOPAMIDOL (ISOVUE-370) INJECTION 76%
100.0000 mL | Freq: Once | INTRAVENOUS | Status: AC | PRN
  Administered 2018-01-17: 100 mL via INTRAVENOUS

## 2018-01-17 MED ORDER — LEVOFLOXACIN 750 MG PO TABS: 750.0000 mg | ORAL_TABLET | Freq: Every day | ORAL | 0 refills | Status: DC

## 2018-01-17 NOTE — Discharge Instructions (Addendum)
Given xray showed consolidation, and Levaquin has not improved your symptoms, and now coughing up blood that is more then blood tinged, please go to the emergency department for further evaluation needed.

## 2018-01-17 NOTE — ED Provider Notes (Signed)
[facility] EMERGENCY DEPARTMENT Provider Note   CSN: [id number] Arrival date & time: 01/17/18  1328     History   Chief Complaint Chief Complaint  Patient presents with  . Hemoptysis    HPI Kayla Mendoza is a 42 y.o. female.  HPI Kayla Mendoza is a 42 y.o. female with history of fibromyalgia, restless leg syndrome, migraines, presents to emergency department complaining of hemoptysis.  Patient states she had been diagnosed with influenza last week.  She states she felt better up until 2 days ago when she woke up with worsening cough and fever.  She went to urgent care and was diagnosed with a pneumonia by chest x-ray.  She was started on Levaquin.  She had her third dose of Levaquin today.  She states this morning she has developed bright red blood in her sputum.  She states this alarmed her and that is what brought her here.  She states that her fever has resolved.  She continues to cough, states it is about the same as 2 days ago.  She does admit to some shortness of breath on exertion.  Denies any recent travel or surgeries.  No lower extremity swelling.  No prior lung issues.  Past Medical History:  Diagnosis Date  . Abnormal uterine bleeding (AUB)   . Anxiety   . Arthritis of back   . Bladder neoplasm   . DDD (degenerative disc disease), lumbar   . Depression   . Fibromyalgia   . GERD (gastroesophageal reflux disease)   . H/O hiatal hernia   . Migraines   . Nocturia   . Pelvic pain in female   . Pinched vertebral nerve   . RLS (restless legs syndrome)   . Scoliosis   . Sleep apnea   . Urgency of urination     Patient Active Problem List   Diagnosis Date Noted  . Lipoma of back 02/22/2017  . Fibromyalgia syndrome 11/10/2016  . Other fatigue 11/10/2016  . Primary insomnia 11/10/2016  . Primary osteoarthritis of both knees 11/10/2016  . Primary osteoarthritis of both hands 11/10/2016  . Vitamin D deficiency 11/10/2016  . DDD (degenerative disc  disease), lumbar 11/10/2016  . History of anxiety 11/10/2016  . History of restless legs syndrome 11/10/2016  . Pelvic pain in female 04/17/2014  . Back pain 07/23/2013  . Lumbar radiculopathy 07/23/2013  . Migraine without aura, with intractable migraine, so stated, without mention of status migrainosus 06/28/2013  . Tension headache 06/28/2013  . HYPERTHYROIDISM 10/27/2010  . GENERALIZED ANXIETY DISORDER 10/27/2010  . GERD 10/27/2010  . ANKLE SPRAIN, LEFT 10/27/2010    Past Surgical History:  Procedure Laterality Date  . ABDOMINAL HYSTERECTOMY    . CYSTOSCOPY N/A 04/17/2014   Procedure: CYSTOSCOPY;  Surgeon: [name], MD;  Location: [facility];  Service: Gynecology;  Laterality: N/A;  clean/contaminated  . CYSTOSCOPY WITH BIOPSY N/A 03/08/2014   Procedure: CYSTOSCOPY WITH  TURBT SMALL HYDROTENSION OF BLADDER INSTALLATION OF MARCAINE AND  PYRIDIUM;  Surgeon: [name], MD;  Location: [facility];  Service: Urology;  Laterality: N/A;  . ESSURE TUBAL LIGATION  2010  . EXCISION OF BACK LESION Right 02/22/2017   Procedure: EXCISION OF MASS RIGHT BACK;  Surgeon: [name], MD;  Location: [facility];  Service: General;  Laterality: Right;  . LAPAROSCOPIC ASSISTED VAGINAL HYSTERECTOMY Bilateral 04/17/2014   Procedure: LAPAROSCOPIC ASSISTED VAGINAL HYSTERECTOMY, Bilateral Salpingectomy;  Surgeon: [name], MD;  Location: [facility];  Service: Gynecology;  Laterality: Bilateral;  clean/contaminated  . LIPOMA EXCISION    . NEGATIVE SLEEP STUDY  2013     OB History   None      Home Medications    Prior to Admission medications   Medication Sig Start Date End Date Taking? Authorizing Provider  acetaminophen (TYLENOL) 325 MG tablet Take 650 mg by mouth as needed. 09/15/16  Yes [provider]  ALPRAZolam Duanne Moron) 1 MG tablet Take 1 mg by mouth 3 (three) times daily as needed for anxiety.   Yes [provider]  buPROPion  (WELLBUTRIN XL) 300 MG 24 hr tablet Take 300 mg by mouth daily.   Yes [provider]  diclofenac sodium (VOLTAREN) 1 % GEL Apply 4 g topically 4 (four) times daily. 12/17/16  Yes Panwala, Naitik, PA-C  DULoxetine (CYMBALTA) 60 MG capsule Take 120 mg by mouth daily.   Yes [provider]  levofloxacin (LEVAQUIN) 750 MG tablet Take 1 tablet (750 mg total) by mouth daily. 01/15/18  Yes [name], PA-C  lisinopril (PRINIVIL,ZESTRIL) 40 MG tablet Take 40 mg by mouth daily.    Yes [provider]  Pantoprazole Sodium (PROTONIX PO) Take 40 mg by mouth daily.   Yes [provider]  propranolol (INDERAL) 60 MG tablet Take 60 mg by mouth daily.    Yes [provider]  QUEtiapine (SEROQUEL) 400 MG tablet Take 400 mg by mouth at bedtime.   Yes [provider]  ranitidine (ZANTAC) 150 MG tablet Take 150 mg by mouth at bedtime. 12/16/17  Yes [provider]  tizanidine (ZANAFLEX) 2 MG capsule Take 2 mg by mouth daily.    Yes [provider]  valACYclovir (VALTREX) 1000 MG tablet Take 1,000 mg by mouth daily.   Yes [provider]  Vitamin D, Ergocalciferol, (DRISDOL) 50000 units CAPS capsule Take 50,000 Units by mouth once a week. 10/19/17  Yes [provider]  naproxen (NAPROSYN) 500 MG tablet Take 1 tablet (500 mg total) by mouth 2 (two) times daily. 01/15/18   [name], PA-C    Family History Family History  Problem Relation Age of Onset  . Diabetes Paternal Grandmother   . Diabetes Paternal Grandfather   . Hypertension Maternal Grandfather     Social History Social History   Tobacco Use  . Smoking status: Never Smoker  . Smokeless tobacco: Never Used  Substance Use Topics  . Alcohol use: No  . Drug use: No     Allergies   Neurontin [gabapentin]; Shellfish allergy; and Tramadol   Review of Systems Review of Systems  Constitutional: Negative for chills and fever.  HENT: Negative for  congestion and sore throat.   Respiratory: Positive for cough and shortness of breath. Negative for chest tightness.        Positive for hemoptysis  Cardiovascular: Negative for chest pain, palpitations and leg swelling.  Gastrointestinal: Negative for abdominal pain, diarrhea, nausea and vomiting.  Genitourinary: Negative for dysuria, flank pain, pelvic pain, vaginal bleeding, vaginal discharge and vaginal pain.  Musculoskeletal: Negative for arthralgias, myalgias, neck pain and neck stiffness.  Skin: Negative for rash.  Neurological: Negative for dizziness, weakness and headaches.  All other systems reviewed and are negative.    Physical Exam Updated Vital Signs BP (!) 102/59 (BP Location: Right Arm)   Pulse 90   Temp 98.3 F (36.8 C) (Oral)   Resp 16   LMP 03/28/2014   SpO2 98%   Physical Exam  Constitutional: She appears well-developed and well-nourished. No distress.  HENT:  Head: Normocephalic.  Eyes: Conjunctivae are normal.  Neck: Neck supple.  Cardiovascular: Normal rate, regular rhythm and normal heart sounds.  Pulmonary/Chest: Effort normal and breath sounds normal. No stridor. No respiratory distress. She has no wheezes. She has no rales.  Abdominal: Soft. Bowel sounds are normal. She exhibits no distension. There is no tenderness. There is no rebound.  Musculoskeletal: She exhibits no edema.  Neurological: She is alert.  Skin: Skin is warm and dry.  Psychiatric: She has a normal mood and affect. Her behavior is normal.  Nursing note and vitals reviewed.    ED Treatments / Results  Labs (all labs ordered are listed, but only abnormal results are displayed) Labs Reviewed  CBC WITH DIFFERENTIAL/PLATELET - Abnormal; Notable for the following components:      Result Value   WBC 11.0 (*)    All other components within normal limits  COMPREHENSIVE METABOLIC PANEL - Abnormal; Notable for the following components:   Glucose, Bld 113 (*)    Creatinine, Ser 1.19  (*)    Calcium 8.3 (*)    Albumin 3.2 (*)    GFR calc non Af Amer 55 (*)    All other components within normal limits    EKG None  Radiology Ct Angio Chest Pe W And/or Wo Contrast  Result Date: 01/17/2018 CLINICAL DATA:  History of left-sided pneumonia with hemoptysis EXAM: CT ANGIOGRAPHY CHEST WITH CONTRAST TECHNIQUE: Multidetector CT imaging of the chest was performed using the standard protocol during bolus administration of intravenous contrast. Multiplanar CT image reconstructions and MIPs were obtained to evaluate the vascular anatomy. CONTRAST:  175mL ISOVUE-370 IOPAMIDOL (ISOVUE-370) INJECTION 76% COMPARISON:  12/01/2016 CT of the chest, 01/15/2018 plain film FINDINGS: Cardiovascular: Thoracic aorta is within normal limits. No significant atherosclerotic changes are noted. No aneurysmal dilatation is seen. Mild cardiomegaly is noted. The pulmonary artery shows a normal branching pattern without intraluminal filling defect to suggest pulmonary embolism. No significant coronary calcifications are noted. Mediastinum/Nodes: Thoracic inlet is within normal limits. Moderate hiatal hernia is again noted. Some right as ago esophageal adenopathy is noted. Additionally prominent subcarinal lymph nodes are seen measuring 17 mm in short axis. These may be reactive in nature given the changes in the lungs. Stable axillary and subpectoral lymph nodes are noted when compared with the prior exam. Lungs/Pleura: The lungs are well aerated bilaterally. There is significant consolidation in the left lower lobe involving primarily the superior segment but extending inferiorly. No sizable effusion is seen. No parenchymal nodules are noted. Upper Abdomen: Within normal limits. Musculoskeletal: Within normal limits. Review of the MIP images confirms the above findings. IMPRESSION: No evidence of pulmonary emboli. Significant infiltrate in the left lower lobe primarily within the superior segment. This corresponds to  that seen on recent plain film examination. Scattered stable lymph nodes are identified Electronically Signed   By: Inez Catalina M.D.   On: 01/17/2018 14:32    Procedures Procedures (including critical care time)  Medications Ordered in ED Medications  iopamidol (ISOVUE-370) 76 % injection (has no administration in time range)  iopamidol (ISOVUE-370) 76 % injection 100 mL (100 mLs Intravenous Contrast Given 01/17/18 1408)     Initial Impression / Assessment and Plan / ED Course  I have reviewed the triage vital signs and the nursing notes.  Pertinent labs & imaging results that were available during my care of the patient were reviewed by me and considered in my medical  decision making (see chart for details).     Sent in emergency department with no pneumonia, on Levaquin, 3 doses, last dose this morning, was triaged upfront and CT angios was ordered.  CT angios showed no pulmonary embolism but it did confirm significant infiltrate in the left lower lobe.  Patient's vital signs are all within normal.  She is afebrile.  She is nontoxic-appearing.  She is not hypoxic, tachypneic.  Blood work shows white blood cell count of 11, otherwise unremarkable labs.  6:48 PM Discussed with Dr. Ralene Bathe, pt's VS remain normal. She has not had much hemoptosis in ED. Hgb normal. Stable for dc home. Will add 2 more days of levaquin to make it total of 7 days of treatment. I think that since pt's fever has resolved and she is not worsening, antibiotics need more time to work. She is requesting cough medication which I think is appropriate. Will dc home with close outpatient follow up.   Vitals:   01/17/18 1333 01/17/18 1616 01/17/18 1753  BP: 110/77 133/84 (!) 102/59  Pulse: 91 88 90  Resp: 18 16 16   Temp: 98.6 F (37 C)  98.3 F (36.8 C)  TempSrc: Oral  Oral  SpO2: 97% 99% 98%     Final Clinical Impressions(s) / ED Diagnoses   Final diagnoses:  Hemoptysis  Community acquired pneumonia of left  lower lobe of lung [facility])    ED Discharge Orders    None       Jeannett Senior, PA-C 01/18/18 0032    Quintella Reichert, MD 01/18/18 [phone]

## 2018-01-17 NOTE — ED Triage Notes (Signed)
States was seen here on Sunday, Dx with pnumenuonia and "coughing up blood today"

## 2018-01-17 NOTE — ED Provider Notes (Signed)
Patient placed in Quick Look pathway, seen and evaluated   Chief Complaint: cough, with bloody sputum  HPI:   Kayla Mendoza is a 42 y.o. female with hx of fibromyalgia, pneumonia, Hyperthyroidism, and multiple other chronic health problems who presents to the ED with cough that started a week ago.patient also being followed by Hematology/oncology for a "blood problem".  Patient reports going to Urgent Care and being dx with pneumonia and taking Levaquin. Patient returned to Urgent Care today due to coughing up blood and was sent to the ED for further evaluation. Patient reports it is not just streaks of blood that it is a large amount.  ROS: Resp: cough, hemoptysis      Physical Exam:  BP 110/77 (BP Location: Right Arm)   Pulse 91   Temp 98.6 F (37 C) (Oral)   Resp 18   LMP 03/28/2014   SpO2 97%    Gen: No distress  Neuro: Awake and Alert  Skin: Warm and dry  Lungs: rales LLL  Heart: regular rate and rhythm  Abdomen: soft, no tenderness with palpation.   Focused Exam:    Initiation of care has begun. The patient has been counseled on the process, plan, and necessity for staying for the completion/evaluation, and the remainder of the medical screening examination    Ashley Murrain, NP 01/17/18 1350    Carmin Muskrat, MD 01/17/18 [phone]

## 2018-01-17 NOTE — Discharge Instructions (Signed)
Continue antibiotics until finish total 7 days/7 doses. Take cough medications as prescribed. Follow up with family doctor for recheck in 3-5 days. Return if worsening.

## 2018-01-17 NOTE — ED Triage Notes (Signed)
Pt presents for evaluation of cough, dx with PNA on Sunday at [facility], started on levaquin at that time. Now pt reports she is coughing up gross amount of blood since yesterday.

## 2018-01-17 NOTE — ED Provider Notes (Signed)
[facility]    CSN: [id number] Arrival date & time: 01/17/18  1228     History   Chief Complaint Chief Complaint  Patient presents with  . Cough    HPI Kayla Mendoza is a 42 y.o. female.   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 States that continued to have productive cough after starting Levaquin 2 days ago without obvious worsening or improvement. Started this morning, had 4 episodes of hemoptysis, denies just blood tinged. States coughing up significant amounts of bright red blood. She has left chest pain, states tender to palpation and laying on the side. Without obvious shortness of breath or wheezing. States history of ulcers, and has taken ibuprofen these past few days to control fever. States temperature has been around 100 since being seen. Has had some weakness/ dizziness, especially with positional change without syncope. Never smoker.      Past Medical History:  Diagnosis Date  . Abnormal uterine bleeding (AUB)   . Anxiety   . Arthritis of back   . Bladder neoplasm   . DDD (degenerative disc disease), lumbar   . Depression   . Fibromyalgia   . GERD (gastroesophageal reflux disease)   . H/O hiatal hernia   . Migraines   . Nocturia   . Pelvic pain in female   . Pinched vertebral nerve   . RLS (restless legs syndrome)   . Scoliosis   . Sleep apnea   . Urgency of urination     Patient Active Problem List   Diagnosis Date Noted  . Lipoma of back 02/22/2017  . Fibromyalgia syndrome 11/10/2016  . Other fatigue 11/10/2016  . Primary insomnia 11/10/2016  . Primary osteoarthritis of both knees 11/10/2016  . Primary osteoarthritis of both hands 11/10/2016  . Vitamin D deficiency 11/10/2016  . DDD (degenerative disc disease), lumbar 11/10/2016  . History of anxiety 11/10/2016  . History of restless legs syndrome 11/10/2016  . Pelvic pain in female 04/17/2014  .  Back pain 07/23/2013  . Lumbar radiculopathy 07/23/2013  . Migraine without aura, with intractable migraine, so stated, without mention of status migrainosus 06/28/2013  . Tension headache 06/28/2013  . HYPERTHYROIDISM 10/27/2010  . GENERALIZED ANXIETY DISORDER 10/27/2010  . GERD 10/27/2010  . ANKLE SPRAIN, LEFT 10/27/2010    Past Surgical History:  Procedure Laterality Date  . ABDOMINAL HYSTERECTOMY    . CYSTOSCOPY N/A 04/17/2014   Procedure: CYSTOSCOPY;  Surgeon: [name], MD;  Location: [facility];  Service: Gynecology;  Laterality: N/A;  clean/contaminated  . CYSTOSCOPY WITH BIOPSY N/A 03/08/2014   Procedure: CYSTOSCOPY WITH  TURBT SMALL HYDROTENSION OF BLADDER INSTALLATION OF MARCAINE AND  PYRIDIUM;  Surgeon: [name], MD;  Location: [facility];  Service: Urology;  Laterality: N/A;  . ESSURE TUBAL LIGATION  2010  . EXCISION OF BACK LESION Right 02/22/2017   Procedure: EXCISION OF MASS RIGHT BACK;  Surgeon: [name], MD;  Location: [facility];  Service: General;  Laterality: Right;  . LAPAROSCOPIC ASSISTED VAGINAL HYSTERECTOMY Bilateral 04/17/2014   Procedure: LAPAROSCOPIC ASSISTED VAGINAL HYSTERECTOMY, Bilateral Salpingectomy;  Surgeon: [name], MD;  Location: [facility];  Service: Gynecology;  Laterality: Bilateral;  clean/contaminated  . LIPOMA EXCISION    . NEGATIVE SLEEP STUDY  2013    OB History   None      Home Medications    Prior to Admission medications  Medication Sig Start Date End Date Taking? Authorizing Provider  ALPRAZolam Duanne Moron) 1 MG tablet Take 1 mg by mouth 3 (three) times daily as needed for anxiety.    [provider]  buPROPion (WELLBUTRIN XL) 300 MG 24 hr tablet Take 300 mg by mouth daily.    [provider]  diclofenac sodium (VOLTAREN) 1 % GEL Apply 4 g topically 4 (four) times daily. 12/17/16   Panwala, Naitik, PA-C  diphenhydrAMINE (BENADRYL) 25 MG tablet Take 25 mg by mouth. 09/15/16    [provider]  docusate sodium (COLACE) 100 MG capsule Take 100 mg by mouth daily as needed for mild constipation.    [provider]  DULoxetine (CYMBALTA) 60 MG capsule Take 120 mg by mouth daily.    [provider]  furosemide (LASIX) 20 MG tablet TAKE 1 TABLET EVERY DAY AS NEEDED FOR SWELLING 08/24/16   [provider]  levofloxacin (LEVAQUIN) 750 MG tablet Take 1 tablet (750 mg total) by mouth daily. 01/15/18   [name], PA-C  LISINOPRIL PO Take by mouth.    [provider]  metoCLOPramide (REGLAN) 10 MG tablet Take 10 mg by mouth 4 (four) times daily.    [provider]  naproxen (NAPROSYN) 500 MG tablet Take 1 tablet (500 mg total) by mouth 2 (two) times daily. 01/15/18   [name], PA-C  Pantoprazole Sodium (PROTONIX PO) Take 40 mg by mouth daily.    [provider]  PROPRANOLOL HCL PO Take by mouth.    [provider]  QUEtiapine (SEROQUEL) 400 MG tablet Take 400 mg by mouth at bedtime.    [provider]  tiZANidine HCl (ZANAFLEX PO) Take by mouth.    [provider]  valACYclovir (VALTREX) 1000 MG tablet Take 1,000 mg by mouth daily.    [provider]    Family History Family History  Problem Relation Age of Onset  . Diabetes Paternal Grandmother   . Diabetes Paternal Grandfather   . Hypertension Maternal Grandfather     Social History Social History   Tobacco Use  . Smoking status: Never Smoker  . Smokeless tobacco: Never Used  Substance Use Topics  . Alcohol use: No  . Drug use: No     Allergies   Neurontin [gabapentin]; Shellfish allergy; and Tramadol   Review of Systems Review of Systems  Reason unable to perform ROS: See HPI as above.     Physical Exam Triage Vital Signs ED Triage Vitals  Enc Vitals Group     BP 01/17/18 1304 101/66     Pulse Rate 01/17/18 1248 96     Resp --      Temp 01/17/18 1248 97.8 F (36.6 C)     Temp Source  01/17/18 1248 Oral     SpO2 01/17/18 1248 96 %     Weight --      Height --      Head Circumference --      Peak Flow --      Pain Score 01/17/18 1245 0     Pain Loc --      Pain Edu? --      Excl. in [place]? --    No data found.  Updated Vital Signs BP 101/66 (BP Location: Right Wrist)   Pulse 90   Temp 97.8 F (36.6 C) (Oral)   LMP 03/28/2014   SpO2 93%   Physical Exam  Constitutional: She is oriented to person, place, and time.  She appears well-developed and well-nourished. No distress.  HENT:  Head: Normocephalic and atraumatic.  Eyes: Pupils are equal, round, and reactive to light. Conjunctivae are normal.  Cardiovascular: Normal rate, regular rhythm and normal heart sounds. Exam reveals no gallop and no friction rub.  No murmur heard. Pulmonary/Chest: Effort normal. No accessory muscle usage. No tachypnea. No respiratory distress.  Crackles to left lower field.   Neurological: She is alert and oriented to person, place, and time.     UC Treatments / Results  Labs (all labs ordered are listed, but only abnormal results are displayed) Labs Reviewed - No data to display  EKG None Radiology Dg Chest 2 View  Result Date: 01/15/2018 CLINICAL DATA:  Cough and fever for 1 week. EXAM: CHEST - 2 VIEW COMPARISON:  04/20/2014 and prior radiographs FINDINGS: Airspace disease within the SUPERIOR segment LEFT LOWER lobe noted compatible with pneumonia. The cardiomediastinal silhouette is unremarkable. Mild peribronchial thickening again noted. No evidence of pleural effusion, pneumothorax or acute bony abnormality. IMPRESSION: LEFT LOWER lobe airspace disease compatible with pneumonia. No evidence of pleural effusion. Electronically Signed   By: Margarette Canada M.D.   On: 01/15/2018 14:21    Procedures Procedures (including critical care time)  Medications Ordered in UC Medications - No data to display   Initial Impression / Assessment and Plan / UC Course  I have reviewed the  triage vital signs and the nursing notes.  Pertinent labs & imaging results that were available during my care of the patient were reviewed by me and considered in my medical decision making (see chart for details).    Patient is afebrile, without tachycardia, tachypnea. O2 sat 96% sitting up, 93% when rechecked laying down. She is talking in full sentences, nontoxic in appearance without acute distress. Given abnormal CXR 2 days ago with worsening symptoms on Levaquin, now with hemoptysis, will discharge patient in stable condition to the ED for further evaluation.   Dx include: PE, TB, medication reaction, bleeding ulcer, lung cancer.   Case discussed with Dr Joseph Art, who agrees to plan.   Final Clinical Impressions(s) / UC Diagnoses   Final diagnoses:  Hemoptysis    ED Discharge Orders    None        Ok Edwards, PA-C 01/17/18 1333

## 2018-01-17 NOTE — ED Notes (Signed)
Patient left at this time with all belongings. 

## 2018-02-18 ENCOUNTER — Other Ambulatory Visit: Payer: Self-pay | Admitting: Physician Assistant

## 2018-02-20 NOTE — Telephone Encounter (Signed)
Can not find patient as current.

## 2018-02-22 DIAGNOSIS — G4733 Obstructive sleep apnea (adult) (pediatric): Secondary | ICD-10-CM | POA: Insufficient documentation

## 2018-02-22 DIAGNOSIS — R0602 Shortness of breath: Secondary | ICD-10-CM | POA: Insufficient documentation

## 2018-02-27 ENCOUNTER — Other Ambulatory Visit: Payer: Self-pay | Admitting: Physician Assistant

## 2018-02-27 DIAGNOSIS — R1115 Cyclical vomiting syndrome unrelated to migraine: Secondary | ICD-10-CM

## 2018-02-27 DIAGNOSIS — K2100 Gastro-esophageal reflux disease with esophagitis, without bleeding: Secondary | ICD-10-CM

## 2018-02-27 DIAGNOSIS — K449 Diaphragmatic hernia without obstruction or gangrene: Secondary | ICD-10-CM

## 2018-03-01 ENCOUNTER — Ambulatory Visit
Admission: RE | Admit: 2018-03-01 | Discharge: 2018-03-01 | Disposition: A | Discharge status: Home / Self Care | Payer: Medicaid Other | Source: Ambulatory Visit | Attending: Physician Assistant | Admitting: Physician Assistant

## 2018-03-01 DIAGNOSIS — K449 Diaphragmatic hernia without obstruction or gangrene: Secondary | ICD-10-CM

## 2018-03-01 DIAGNOSIS — K2100 Gastro-esophageal reflux disease with esophagitis, without bleeding: Secondary | ICD-10-CM

## 2018-03-01 DIAGNOSIS — R1115 Cyclical vomiting syndrome unrelated to migraine: Secondary | ICD-10-CM

## 2018-05-11 ENCOUNTER — Other Ambulatory Visit (INDEPENDENT_AMBULATORY_CARE_PROVIDER_SITE_OTHER): Payer: Self-pay | Admitting: Radiology

## 2018-05-11 ENCOUNTER — Ambulatory Visit (INDEPENDENT_AMBULATORY_CARE_PROVIDER_SITE_OTHER): Payer: Medicaid Other

## 2018-05-11 ENCOUNTER — Encounter (INDEPENDENT_AMBULATORY_CARE_PROVIDER_SITE_OTHER): Payer: Self-pay | Admitting: Orthopaedic Surgery

## 2018-05-11 ENCOUNTER — Ambulatory Visit (INDEPENDENT_AMBULATORY_CARE_PROVIDER_SITE_OTHER): Payer: Medicaid Other | Admitting: Orthopaedic Surgery

## 2018-05-11 VITALS — BP 135/90 | HR 71 | Ht 64.0 in | Wt 260.0 lb

## 2018-05-11 DIAGNOSIS — G8929 Other chronic pain: Secondary | ICD-10-CM

## 2018-05-11 DIAGNOSIS — M545 Low back pain: Secondary | ICD-10-CM | POA: Diagnosis not present

## 2018-05-11 MED ORDER — DICLOFENAC SODIUM 1 % TD GEL: TRANSDERMAL | 3 refills | Status: DC
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
## 2018-05-14 ENCOUNTER — Ambulatory Visit (INDEPENDENT_AMBULATORY_CARE_PROVIDER_SITE_OTHER): Payer: Medicaid Other

## 2018-05-14 ENCOUNTER — Ambulatory Visit (HOSPITAL_COMMUNITY)
Admission: EM | Admit: 2018-05-14 | Discharge: 2018-05-14 | Disposition: A | Discharge status: Home / Self Care | Payer: Medicaid Other | Attending: Internal Medicine | Admitting: Internal Medicine

## 2018-05-14 ENCOUNTER — Encounter (HOSPITAL_COMMUNITY): Payer: Self-pay

## 2018-05-14 DIAGNOSIS — M25571 Pain in right ankle and joints of right foot: Secondary | ICD-10-CM | POA: Diagnosis not present

## 2018-05-14 DIAGNOSIS — M79671 Pain in right foot: Secondary | ICD-10-CM

## 2018-05-14 MED ORDER — CELECOXIB 200 MG PO CAPS: 200.0000 mg | ORAL_CAPSULE | Freq: Two times a day (BID) | ORAL | 0 refills | Status: DC

## 2018-05-14 NOTE — Discharge Instructions (Signed)
Use anti-inflammatories for pain/swelling. You may take up to 600-800 mg Ibuprofen every 8 hours with food. OR celebrex You may supplement Ibuprofen/Celebrex with Tylenol [phone] mg every 8 hours.   Ice and elevate  Follow up with ortho or podiatry if symptoms persisting

## 2018-05-14 NOTE — ED Provider Notes (Signed)
[facility]    CSN: [id number] Arrival date & time: 05/14/18  1500     History   Chief Complaint Chief Complaint  Patient presents with  . Foot Pain    HPI Kayla Mendoza is a 42 y.o. female   History of fibromyalgia, osteoarthritis, presenting today for evaluation of right foot pain.  Patient states that she has had this pain for the past 4 days.  Denies any specific injury or increase in activity.  Has also had swelling, but this is normal for her, no increase from normal.  She has noticed a lump near her right ankle.  No pain while resting, pain only with weightbearing.  She has taken Tylenol without relief.  She denies any numbness or tingling.     Past Medical History:  Diagnosis Date  . Abnormal uterine bleeding (AUB)   . Anxiety   . Arthritis of back   . Bladder neoplasm   . DDD (degenerative disc disease), lumbar   . Depression   . Fibromyalgia   . GERD (gastroesophageal reflux disease)   . H/O hiatal hernia   . Migraines   . Nocturia   . Pelvic pain in female   . Pinched vertebral nerve   . RLS (restless legs syndrome)   . Scoliosis   . Sleep apnea   . Urgency of urination     Patient Active Problem List   Diagnosis Date Noted  . Lipoma of back 02/22/2017  . Fibromyalgia syndrome 11/10/2016  . Other fatigue 11/10/2016  . Primary insomnia 11/10/2016  . Primary osteoarthritis of both knees 11/10/2016  . Primary osteoarthritis of both hands 11/10/2016  . Vitamin D deficiency 11/10/2016  . DDD (degenerative disc disease), lumbar 11/10/2016  . History of anxiety 11/10/2016  . History of restless legs syndrome 11/10/2016  . Pelvic pain in female 04/17/2014  . Back pain 07/23/2013  . Lumbar radiculopathy 07/23/2013  . Migraine without aura, with intractable migraine, so stated, without mention of status migrainosus 06/28/2013  . Tension headache 06/28/2013  . HYPERTHYROIDISM 10/27/2010  . GENERALIZED ANXIETY DISORDER 10/27/2010  . GERD  10/27/2010  . ANKLE SPRAIN, LEFT 10/27/2010    Past Surgical History:  Procedure Laterality Date  . ABDOMINAL HYSTERECTOMY    . CYSTOSCOPY N/A 04/17/2014   Procedure: CYSTOSCOPY;  Surgeon: [name], MD;  Location: [facility];  Service: Gynecology;  Laterality: N/A;  clean/contaminated  . CYSTOSCOPY WITH BIOPSY N/A 03/08/2014   Procedure: CYSTOSCOPY WITH  TURBT SMALL HYDROTENSION OF BLADDER INSTALLATION OF MARCAINE AND  PYRIDIUM;  Surgeon: [name], MD;  Location: [facility];  Service: Urology;  Laterality: N/A;  . ESSURE TUBAL LIGATION  2010  . EXCISION OF BACK LESION Right 02/22/2017   Procedure: EXCISION OF MASS RIGHT BACK;  Surgeon: [name], MD;  Location: [facility];  Service: General;  Laterality: Right;  . LAPAROSCOPIC ASSISTED VAGINAL HYSTERECTOMY Bilateral 04/17/2014   Procedure: LAPAROSCOPIC ASSISTED VAGINAL HYSTERECTOMY, Bilateral Salpingectomy;  Surgeon: [name], MD;  Location: [facility];  Service: Gynecology;  Laterality: Bilateral;  clean/contaminated  . LIPOMA EXCISION    . NEGATIVE SLEEP STUDY  2013    OB History   None      Home Medications    Prior to Admission medications   Medication Sig Start Date End Date Taking? Authorizing Provider  acetaminophen (TYLENOL) 325 MG tablet Take 650 mg by mouth as needed. 09/15/16  Yes [provider]  ALPRAZolam Duanne Moron) 1 MG tablet Take  1 mg by mouth 3 (three) times daily as needed for anxiety.   Yes [provider]  buPROPion (WELLBUTRIN XL) 300 MG 24 hr tablet Take 300 mg by mouth daily.   Yes [provider]  diclofenac sodium (VOLTAREN) 1 % GEL APPLY 2-4 GRAMS TO LARGE JOINT AREA UP TO FOUR TIMES A DAY AS NEEDED 05/11/18  Yes [name], MD  DULoxetine (CYMBALTA) 60 MG capsule Take 120 mg by mouth daily.   Yes [provider]  esomeprazole (NEXIUM) 40 MG capsule Take 40 mg by mouth 2 (two) times daily. 03/25/18  Yes [provider]  lisinopril (PRINIVIL,ZESTRIL) 40 MG tablet Take 40 mg by mouth daily.    Yes [provider]  naproxen (NAPROSYN) 500 MG tablet Take 1 tablet (500 mg total) by mouth 2 (two) times daily. 01/15/18  Yes [name], PA-C  propranolol (INDERAL) 60 MG tablet Take 60 mg by mouth daily.    Yes [provider]  QUEtiapine (SEROQUEL) 400 MG tablet Take 400 mg by mouth at bedtime.   Yes [provider]  ranitidine (ZANTAC) 150 MG tablet Take 150 mg by mouth at bedtime. 12/16/17  Yes [provider]  sucralfate (CARAFATE) 1 g tablet 1 TABLET ON AN EMPTY STOMACH BEFORE MEALS AND AT BEDTIME ORALLY 30 DAYS 04/09/18  Yes [provider]  tizanidine (ZANAFLEX) 2 MG capsule Take 2 mg by mouth daily.    Yes [provider]  valACYclovir (VALTREX) 1000 MG tablet Take 1,000 mg by mouth daily.   Yes [provider]  Vitamin D, Ergocalciferol, (DRISDOL) 50000 units CAPS capsule Take 50,000 Units by mouth once a week. 10/19/17  Yes [provider]  celecoxib (CELEBREX) 200 MG capsule Take 1 capsule (200 mg total) by mouth 2 (two) times daily. 05/14/18   Kycen Spalla, Elesa Hacker, PA-C    Family History Family History  Problem Relation Age of Onset  . Diabetes Paternal Grandmother   . Diabetes Paternal Grandfather   . Hypertension Maternal Grandfather     Social History Social History   Tobacco Use  . Smoking status: Never Smoker  . Smokeless tobacco: Never Used  Substance Use Topics  . Alcohol use: No  . Drug use: No     Allergies   Neurontin [gabapentin]; Shellfish allergy; and Tramadol   Review of Systems Review of Systems  Constitutional: Negative for fatigue and fever.  Eyes: Negative for visual disturbance.  Respiratory: Negative for shortness of breath.   Cardiovascular: Negative for chest pain.  Gastrointestinal: Negative for abdominal pain, nausea and vomiting.  Musculoskeletal: Positive for arthralgias, gait problem and  myalgias. Negative for joint swelling.  Skin: Positive for color change and rash. Negative for wound.  Neurological: Negative for dizziness, weakness, light-headedness and headaches.     Physical Exam Triage Vital Signs ED Triage Vitals  Enc Vitals Group     BP 05/14/18 1607 129/90     Pulse Rate 05/14/18 1607 61     Resp 05/14/18 1607 18     Temp 05/14/18 1607 (!) 97 F (36.1 C)     Temp src --      SpO2 05/14/18 1607 100 %     Weight --      Height --      Head Circumference --      Peak Flow --      Pain Score 05/14/18 1605 7     Pain Loc --  Pain Edu? --      Excl. in [place]? --    No data found.  Updated Vital Signs BP 129/90   Pulse 61   Temp (!) 97 F (36.1 C)   Resp 18   LMP 03/28/2014   SpO2 100%   Visual Acuity Right Eye Distance:   Left Eye Distance:   Bilateral Distance:    Right Eye Near:   Left Eye Near:    Bilateral Near:     Physical Exam  Constitutional: She is oriented to person, place, and time. She appears well-developed and well-nourished.  No acute distress  HENT:  Head: Normocephalic and atraumatic.  Nose: Nose normal.  Eyes: Conjunctivae are normal.  Neck: Neck supple.  Cardiovascular: Normal rate.  Pulmonary/Chest: Effort normal. No respiratory distress.  Abdominal: She exhibits no distension.  Musculoskeletal: Normal range of motion.  Mild swelling to bilateral lower extremities, right foot with tenderness to palpation to inferior aspect of lateral malleolus extending into proximal fifth metatarsal, 2 small movable bumps palpated near proximal end of metatarsal.  Neurological: She is alert and oriented to person, place, and time.  Skin: Skin is warm and dry.  Psychiatric: She has a normal mood and affect.  Nursing note and vitals reviewed.    UC Treatments / Results  Labs (all labs ordered are listed, but only abnormal results are displayed) Labs Reviewed - No data to display  EKG None  Radiology Dg Ankle Complete  Right  Result Date: 05/14/2018 CLINICAL DATA:  Acute RIGHT ankle pain and swelling for 4 days. No known injury. Initial encounter. EXAM: RIGHT ANKLE - COMPLETE 3+ VIEW COMPARISON:  None. FINDINGS: No acute fracture, subluxation or dislocation. No focal bony lesions are identified. The joint spaces are unremarkable. Soft tissue swelling noted. IMPRESSION: Soft tissue swelling without bony or joint abnormality. Electronically Signed   By: Margarette Canada M.D.   On: 05/14/2018 17:14    Procedures Procedures (including critical care time)  Medications Ordered in UC Medications - No data to display  Initial Impression / Assessment and Plan / UC Course  I have reviewed the triage vital signs and the nursing notes.  Pertinent labs & imaging results that were available during my care of the patient were reviewed by me and considered in my medical decision making (see chart for details).     X-ray negative for bony abnormality, possible cyst versus lipoma causing bump, feel strain more likely.  Will recommend conservative treatment with anti-inflammatories, icing and elevation.  Follow-up with orthopedics or podiatry if symptoms persisting.Discussed strict return precautions. Patient verbalized understanding and is agreeable with plan.  Final Clinical Impressions(s) / UC Diagnoses   Final diagnoses:  Foot pain, right     Discharge Instructions     Use anti-inflammatories for pain/swelling. You may take up to 600-800 mg Ibuprofen every 8 hours with food. OR celebrex You may supplement Ibuprofen/Celebrex with Tylenol [phone] mg every 8 hours.   Ice and elevate  Follow up with ortho or podiatry if symptoms persisting     ED Prescriptions    Medication Sig Dispense Auth. Provider   celecoxib (CELEBREX) 200 MG capsule Take 1 capsule (200 mg total) by mouth 2 (two) times daily. 40 capsule Omar Orrego C, PA-C     Controlled Substance Prescriptions [place] Controlled Substance Registry  consulted? Not Applicable   Shelton Silvas 05/14/18 1904

## 2018-05-14 NOTE — ED Triage Notes (Signed)
Pt presents with pain to her right foot, denies any injury. Ambulatory.

## 2018-05-16 ENCOUNTER — Telehealth (INDEPENDENT_AMBULATORY_CARE_PROVIDER_SITE_OTHER): Payer: Self-pay | Admitting: Orthopaedic Surgery

## 2018-05-16 ENCOUNTER — Other Ambulatory Visit: Payer: Self-pay | Admitting: Radiology

## 2018-05-16 MED ORDER — DICLOFENAC SODIUM 1 % TD GEL: TRANSDERMAL | 3 refills | Status: DC

## 2018-05-16 NOTE — Telephone Encounter (Signed)
Patient called stating her prescription of Voltaren Gel was sent to the wrong pharmacy.  Patient states she needs it sent to [facility] on [address] in [place].

## 2018-05-16 NOTE — Telephone Encounter (Signed)
Called into correct pharmacy

## 2018-05-29 ENCOUNTER — Ambulatory Visit (INDEPENDENT_AMBULATORY_CARE_PROVIDER_SITE_OTHER): Payer: Medicaid Other | Admitting: Orthopaedic Surgery

## 2018-06-08 ENCOUNTER — Ambulatory Visit: Payer: Medicaid Other | Admitting: Podiatry

## 2018-06-08 ENCOUNTER — Ambulatory Visit: Payer: Self-pay

## 2018-06-08 ENCOUNTER — Encounter: Payer: Self-pay | Admitting: Podiatry

## 2018-06-08 DIAGNOSIS — M779 Enthesopathy, unspecified: Principal | ICD-10-CM

## 2018-06-08 DIAGNOSIS — M778 Other enthesopathies, not elsewhere classified: Secondary | ICD-10-CM

## 2018-06-08 DIAGNOSIS — M6740 Ganglion, unspecified site: Secondary | ICD-10-CM | POA: Diagnosis not present

## 2018-06-08 DIAGNOSIS — M7751 Other enthesopathy of right foot: Secondary | ICD-10-CM | POA: Diagnosis not present

## 2018-06-08 NOTE — Progress Notes (Signed)
  Subjective:  Patient ID: Kayla Mendoza, female    DOB: [birth year],  MRN: [id number]  Chief Complaint  Patient presents with  . Foot Pain    right foot- xrays done at [facility] urgent care - small area of swelling lateral ankle - possible cyst? has been there about a month    42 y.o. female presents with the above complaint. Reports pain at the right foot. Present for a month. Had XR at [facility] urgent care. Told it was a cyst. Denies other treatments. Denies other pedal issues.  Review of Systems: Negative except as noted in the HPI. Denies N/V/F/Ch.  History reviewed. No pertinent past medical history.  Current Outpatient Medications:  .  buPROPion (WELLBUTRIN SR) 150 MG 12 hr tablet, Take 150 mg by mouth 2 (two) times daily., Disp: , Rfl:  .  dicyclomine (BENTYL) 20 MG tablet, Take 20 mg by mouth every 6 (six) hours., Disp: , Rfl:  .  DULoxetine (CYMBALTA) 30 MG capsule, Take 30 mg by mouth daily., Disp: , Rfl:  .  esomeprazole (NEXIUM) 40 MG capsule, Take 40 mg by mouth daily at 12 noon., Disp: , Rfl:  .  lisinopril (PRINIVIL,ZESTRIL) 10 MG tablet, Take 10 mg by mouth daily., Disp: , Rfl:  .  metoprolol succinate (TOPROL-XL) 50 MG 24 hr tablet, Take 50 mg by mouth daily. Take with or immediately following a meal., Disp: , Rfl:  .  tiZANidine (ZANAFLEX) 2 MG tablet, Take by mouth every 6 (six) hours as needed for muscle spasms., Disp: , Rfl:   Social History   Tobacco Use  Smoking Status Not on file    Allergies  Allergen Reactions  . Neurontin [Gabapentin]   . Tramadol Hcl    Objective:  There were no vitals filed for this visit. There is no height or weight on file to calculate BMI. Constitutional Well developed. Well nourished.  Vascular Dorsalis pedis pulses palpable bilaterally. Posterior tibial pulses palpable bilaterally. Capillary refill normal to all digits.  No cyanosis or clubbing noted. Pedal hair growth normal.  Neurologic Normal speech. Oriented to person, place, and  time. Epicritic sensation to light touch grossly present bilaterally.  Dermatologic Nails well groomed and normal in appearance. No open wounds. No skin lesions.  Orthopedic: Normal joint ROM without pain or crepitus bilaterally. No visible deformities. Palpable painful cyst about the cuboid R foot.   Radiographs: None today Assessment:   1. Capsulitis of right foot   2. Ganglion cyst    Plan:  Patient was evaluated and treated and all questions answered.  Ganglion cyst R ankle -No XR performed today. -Injection delivered to the cyst as below.  Procedure: Cyst Injection Location: Right ganglion cyst Skin Prep: Alcohol. Injectate: 0.5 cc 1% lidocaine plain, 0.5 cc dexamethasone phosphate. Disposition: Patient tolerated procedure well. Injection site dressed with a band-aid.    Return if symptoms worsen or fail to improve.

## 2018-06-09 ENCOUNTER — Encounter (HOSPITAL_COMMUNITY): Payer: Self-pay

## 2018-06-22 ENCOUNTER — Inpatient Hospital Stay: Payer: Medicaid Other | Attending: Oncology | Admitting: Oncology

## 2018-06-22 ENCOUNTER — Telehealth: Payer: Self-pay | Admitting: Oncology

## 2018-06-22 ENCOUNTER — Inpatient Hospital Stay: Payer: Medicaid Other

## 2018-06-22 VITALS — BP 114/70 | HR 59 | Temp 98.1°F | Resp 18 | Ht 64.0 in | Wt 263.1 lb

## 2018-06-22 DIAGNOSIS — D472 Monoclonal gammopathy: Secondary | ICD-10-CM

## 2018-06-22 LAB — COMPREHENSIVE METABOLIC PANEL
ALT: 15 U/L (ref 0–44)
AST: 14 U/L — ABNORMAL LOW (ref 15–41)
Albumin: 3.2 g/dL — ABNORMAL LOW (ref 3.5–5.0)
Alkaline Phosphatase: 98 U/L (ref 38–126)
Anion gap: 7 (ref 5–15)
BUN: 7 mg/dL (ref 6–20)
CHLORIDE: 108 mmol/L (ref 98–111)
CO2: 28 mmol/L (ref 22–32)
Calcium: 8.8 mg/dL — ABNORMAL LOW (ref 8.9–10.3)
Creatinine, Ser: 0.83 mg/dL (ref 0.44–1.00)
GFR calc Af Amer: >60 mL/min (ref >60)
Glucose, Bld: 92 mg/dL (ref 70–99)
POTASSIUM: 3.9 mmol/L (ref 3.5–5.1)
Sodium: 143 mmol/L (ref 135–145)
Total Bilirubin: 0.3 mg/dL (ref 0.3–1.2)
Total Protein: 6.6 g/dL (ref 6.5–8.1)

## 2018-06-22 LAB — CBC WITH DIFFERENTIAL/PLATELET
Basophils Absolute: 0 10*3/uL (ref 0.0–0.1)
Basophils Relative: 0 %
EOS PCT: 4 %
Eosinophils Absolute: 0.3 10*3/uL (ref 0.0–0.5)
HCT: 35.5 % (ref 34.8–46.6)
HEMOGLOBIN: 11.6 g/dL (ref 11.6–15.9)
LYMPHS ABS: 2.9 10*3/uL (ref 0.9–3.3)
LYMPHS PCT: 42 %
MCH: 29.1 pg (ref 25.1–34.0)
MCHC: 32.7 g/dL (ref 31.5–36.0)
MCV: 89.2 fL (ref 79.5–101.0)
Monocytes Absolute: 0.3 10*3/uL (ref 0.1–0.9)
Monocytes Relative: 5 %
Neutro Abs: 3.4 10*3/uL (ref 1.5–6.5)
Neutrophils Relative %: 49 %
PLATELETS: 282 10*3/uL (ref 145–400)
RBC: 3.98 MIL/uL (ref 3.70–5.45)
RDW: 14.7 % — ABNORMAL HIGH (ref 11.2–14.5)
WBC: 6.8 10*3/uL (ref 3.9–10.3)

## 2018-06-22 NOTE — Telephone Encounter (Signed)
Appts scheduled AVS/Calendar printed per 9/5 los °

## 2018-06-22 NOTE — Progress Notes (Signed)
Hematology and Oncology Follow Up Visit  Kayla Mendoza [id number] 05/14/1976 42 y.o. 06/22/2018 8:52 AM   Principle Diagnosis: 42 year old woman with a IgG kappa MGUS diagnosed in 2016.  She presented with M spike of 0.37 g/dL and IgG no evidence to suggest endorgan damage or multiple myeloma.  Current therapy: Active surveillance.  Interim History: Kayla Mendoza is here for a follow-up visit.  Since the last visit, she reports no major changes in her health.  She was seen in the emergency department in April 2019 and had a CT scan of the chest that ruled out pulmonary embolism.  Laboratory at that time showed a hemoglobin of 12 with normal electrolytes, liver function test and calcium.  Since that time, her symptoms has resolved at this time.  She denies any worsening back pain or pathological fractures.  She denies any pain or syncope.  Her performance status and activity level remained the same.  She does not report any headaches, blurry vision, double vision or syncope or seizures. She does not report any fevers, chills or sweats. She does not report any chest pain, shortness of breath, orthopnea or palpitation.  Does not report any cough, wheezing or difficulty breathing. She does not report any nausea, vomiting.  She denies any changes in bowel habits.  She does not report any frequency, urgency or hesitancy.  She denies any lymphadenopathy or petechiae.  She denies any worsening arthralgias or myalgias.  Rest of her review of systems is negative.   Medications: I have reviewed the patient's current medications.  Current Outpatient Medications  Medication Sig Dispense Refill  . acetaminophen (TYLENOL) 325 MG tablet Take 650 mg by mouth as needed.    . ALPRAZolam (XANAX) 1 MG tablet Take 1 mg by mouth 3 (three) times daily as needed for anxiety.    Marland Kitchen buPROPion (WELLBUTRIN SR) 150 MG 12 hr tablet Take 150 mg by mouth 2 (two) times daily.    Marland Kitchen buPROPion (WELLBUTRIN XL) 300 MG 24 hr tablet Take 300  mg by mouth daily.    . celecoxib (CELEBREX) 200 MG capsule Take 1 capsule (200 mg total) by mouth 2 (two) times daily. 40 capsule 0  . diclofenac sodium (VOLTAREN) 1 % GEL APPLY 2-4 GRAMS TO LARGE JOINT AREA UP TO FOUR TIMES A DAY AS NEEDED 10 Tube 3  . diclofenac sodium (VOLTAREN) 1 % GEL Apply 2-4 grams to large joint area up to four times a day as needed 2 Tube 3  . dicyclomine (BENTYL) 20 MG tablet Take 20 mg by mouth every 6 (six) hours.    . DULoxetine (CYMBALTA) 30 MG capsule Take 30 mg by mouth daily.    . DULoxetine (CYMBALTA) 60 MG capsule Take 120 mg by mouth daily.    Marland Kitchen esomeprazole (NEXIUM) 40 MG capsule Take 40 mg by mouth 2 (two) times daily.  5  . esomeprazole (NEXIUM) 40 MG capsule Take 40 mg by mouth daily at 12 noon.    Marland Kitchen lisinopril (PRINIVIL,ZESTRIL) 10 MG tablet Take 10 mg by mouth daily.    Marland Kitchen lisinopril (PRINIVIL,ZESTRIL) 40 MG tablet Take 40 mg by mouth daily.     . metoprolol succinate (TOPROL-XL) 50 MG 24 hr tablet Take 50 mg by mouth daily. Take with or immediately following a meal.    . naproxen (NAPROSYN) 500 MG tablet Take 1 tablet (500 mg total) by mouth 2 (two) times daily. 30 tablet 0  . propranolol (INDERAL) 60 MG tablet Take 60 mg by  mouth daily.     . QUEtiapine (SEROQUEL) 400 MG tablet Take 400 mg by mouth at bedtime.    . ranitidine (ZANTAC) 150 MG tablet Take 150 mg by mouth at bedtime.  12  . sucralfate (CARAFATE) 1 g tablet 1 TABLET ON AN EMPTY STOMACH BEFORE MEALS AND AT BEDTIME ORALLY 30 DAYS  3  . tizanidine (ZANAFLEX) 2 MG capsule Take 2 mg by mouth daily.     Marland Kitchen tiZANidine (ZANAFLEX) 2 MG tablet Take by mouth every 6 (six) hours as needed for muscle spasms.    . valACYclovir (VALTREX) 1000 MG tablet Take 1,000 mg by mouth daily.    . Vitamin D, Ergocalciferol, (DRISDOL) 50000 units CAPS capsule Take 50,000 Units by mouth once a week.  3   No current facility-administered medications for this visit.      Allergies:  Allergies  Allergen Reactions   . Neurontin [Gabapentin] Other (See Comments)    Caused chest pain  . Shellfish Allergy Hives  . Neurontin [Gabapentin]   . Tramadol Hcl   . Tramadol Itching and Rash    Past Medical History, Surgical history, Social history, and Family History were reviewed and updated.  Physical Exam: Blood pressure 114/70, pulse (!) 59, temperature 98.1 F (36.7 C), temperature source Oral, resp. rate 18, height '5\' 4"'  (1.626 m), last menstrual period 03/28/2014, SpO2 97 %.   ECOG: 0   General appearance: Alert, awake without any distress. Head: Atraumatic without abnormalities Oropharynx: Without any thrush or ulcers. Eyes: No scleral icterus. Lymph nodes: No lymphadenopathy noted in the cervical, supraclavicular, or axillary nodes Heart:regular rate and rhythm, without any murmurs or gallops.   Lung: Clear to auscultation without any rhonchi, wheezes or dullness to percussion. Abdomin: Soft, nontender without any shifting dullness or ascites. Musculoskeletal: No clubbing or cyanosis. Neurological: No motor or sensory deficits. Skin: No rashes or lesions.     Lab Results: Lab Results  Component Value Date   WBC 11.0 (H) 01/17/2018   HGB 12.2 01/17/2018   HCT 37.4 01/17/2018   MCV 87.6 01/17/2018   PLT 324 01/17/2018     Chemistry      Component Value Date/Time   NA 137 01/17/2018 1344   NA 140 06/23/2017 0835   K 3.6 01/17/2018 1344   K 3.9 06/23/2017 0835   CL 102 01/17/2018 1344   CO2 26 01/17/2018 1344   CO2 28 06/23/2017 0835   BUN 12 01/17/2018 1344   BUN 8.0 06/23/2017 0835   CREATININE 1.19 (H) 01/17/2018 1344   CREATININE 0.8 06/23/2017 0835      Component Value Date/Time   CALCIUM 8.3 (L) 01/17/2018 1344   CALCIUM 9.0 06/23/2017 0835   ALKPHOS 90 01/17/2018 1344   ALKPHOS 116 06/23/2017 0835   AST 16 01/17/2018 1344   AST 17 06/23/2017 0835   ALT 14 01/17/2018 1344   ALT 11 06/23/2017 0835   BILITOT 0.6 01/17/2018 1344   BILITOT 0.40 06/23/2017 0835       Results for CAILEEN, VERACRUZ (MRN [id number]) as of 06/22/2018 08:22  Ref. Range 06/19/2015 09:38 06/18/2016 08:58 06/23/2017 08:36  IgG (Immunoglobin G), Serum Latest Ref Range: 700 - 1600 mg/dL 1,480 1,619 (H) 1,674 (H)   Results for KATRICIA, PREHN (MRN [id number]) as of 06/22/2018 08:22  Ref. Range 06/18/2016 08:58 06/23/2017 08:36  M Protein SerPl Elph-Mcnc Latest Ref Range: Not Observed g/dL 0.6 (H) 0.8 (H)     Impression and Plan:   42 year old woman with  the following issues:  1.  IgG kappa MGUS diagnosed in 2016.  She presented with M spike of 0.37 g/dL with a normal IgG level.  Protein studies obtained in September 2018 showed minimal changes in the last 2 years.  Her M spike is still less than 1 g/dL with IgG level that is slightly elevated.  The natural course of this disease was reviewed as well as risk to progression to multiple myeloma was reviewed as well.  At this time, I recommended continued annual monitoring with a risk of progression estimated about 1 %/year.  She develops rapid rise in her protein studies, we will restage her with a bone marrow biopsy and skeletal survey.   2. Diffuse bone pain: Unrelated to plasma cell disorder.  3. Follow-up: Will be in one year.   15  minutes was spent with the patient face-to-face today.  More than 50% of time was dedicated to discussing the natural course of her disease, reviewing laboratory data and discussing treatment options.    Zola Button, MD 9/5/20198:52 AM

## 2018-06-23 LAB — MULTIPLE MYELOMA PANEL, SERUM
ALBUMIN SERPL ELPH-MCNC: 3.1 g/dL (ref 2.9–4.4)
ALBUMIN/GLOB SERPL: 1 (ref 0.7–1.7)
Alpha 1: 0.2 g/dL (ref 0.0–0.4)
Alpha2 Glob SerPl Elph-Mcnc: 0.7 g/dL (ref 0.4–1.0)
B-Globulin SerPl Elph-Mcnc: 1 g/dL (ref 0.7–1.3)
GAMMA GLOB SERPL ELPH-MCNC: 1.3 g/dL (ref 0.4–1.8)
GLOBULIN, TOTAL: 3.2 g/dL (ref 2.2–3.9)
IgA: 68 mg/dL — ABNORMAL LOW (ref 87–352)
IgG (Immunoglobin G), Serum: 1523 mg/dL (ref 700–1600)
IgM (Immunoglobulin M), Srm: 46 mg/dL (ref 26–217)
M Protein SerPl Elph-Mcnc: 0.8 g/dL — ABNORMAL HIGH
Total Protein ELP: 6.3 g/dL (ref 6.0–8.5)

## 2018-06-23 LAB — KAPPA/LAMBDA LIGHT CHAINS
Kappa free light chain: 28.6 mg/L — ABNORMAL HIGH (ref 3.3–19.4)
Kappa, lambda light chain ratio: 2.62 — ABNORMAL HIGH (ref 0.26–1.65)
LAMDA FREE LIGHT CHAINS: 10.9 mg/L (ref 5.7–26.3)

## 2018-07-26 ENCOUNTER — Other Ambulatory Visit: Payer: Self-pay | Admitting: Nurse Practitioner

## 2018-07-26 DIAGNOSIS — Z1231 Encounter for screening mammogram for malignant neoplasm of breast: Secondary | ICD-10-CM

## 2018-09-05 ENCOUNTER — Ambulatory Visit
Admission: RE | Admit: 2018-09-05 | Discharge: 2018-09-05 | Disposition: A | Discharge status: Home / Self Care | Payer: Medicaid Other | Source: Ambulatory Visit | Attending: Nurse Practitioner | Admitting: Nurse Practitioner

## 2018-09-05 DIAGNOSIS — Z1231 Encounter for screening mammogram for malignant neoplasm of breast: Secondary | ICD-10-CM

## 2018-12-05 ENCOUNTER — Telehealth (INDEPENDENT_AMBULATORY_CARE_PROVIDER_SITE_OTHER): Payer: Self-pay | Admitting: Orthopaedic Surgery

## 2018-12-05 NOTE — Telephone Encounter (Signed)
Called and spoke with patient. I notified her that insurance would not cover the Voltaren gel. I suggested that she try GoodRx. Patient understood.

## 2019-06-22 ENCOUNTER — Other Ambulatory Visit: Payer: Self-pay

## 2019-06-22 ENCOUNTER — Inpatient Hospital Stay: Payer: Medicaid Other | Attending: Oncology

## 2019-06-22 ENCOUNTER — Inpatient Hospital Stay (HOSPITAL_BASED_OUTPATIENT_CLINIC_OR_DEPARTMENT_OTHER): Payer: Medicaid Other | Admitting: Oncology

## 2019-06-22 VITALS — BP 112/54 | HR 63 | Temp 98.5°F | Resp 18 | Ht 64.0 in | Wt 264.6 lb

## 2019-06-22 DIAGNOSIS — G8929 Other chronic pain: Secondary | ICD-10-CM | POA: Diagnosis not present

## 2019-06-22 DIAGNOSIS — D472 Monoclonal gammopathy: Secondary | ICD-10-CM | POA: Diagnosis present

## 2019-06-22 DIAGNOSIS — M797 Fibromyalgia: Secondary | ICD-10-CM | POA: Insufficient documentation

## 2019-06-22 DIAGNOSIS — Z79899 Other long term (current) drug therapy: Secondary | ICD-10-CM | POA: Diagnosis not present

## 2019-06-22 DIAGNOSIS — Z791 Long term (current) use of non-steroidal anti-inflammatories (NSAID): Secondary | ICD-10-CM | POA: Diagnosis not present

## 2019-06-22 DIAGNOSIS — M2550 Pain in unspecified joint: Secondary | ICD-10-CM | POA: Insufficient documentation

## 2019-06-22 LAB — CBC WITH DIFFERENTIAL (CANCER CENTER ONLY)
Abs Immature Granulocytes: 0.02 10*3/uL (ref 0.00–0.07)
Basophils Absolute: 0 10*3/uL (ref 0.0–0.1)
Basophils Relative: 1 %
Eosinophils Absolute: 0.3 10*3/uL (ref 0.0–0.5)
Eosinophils Relative: 4 %
HCT: 36.6 % (ref 36.0–46.0)
Hemoglobin: 11.5 g/dL — ABNORMAL LOW (ref 12.0–15.0)
Immature Granulocytes: 0 %
Lymphocytes Relative: 28 %
Lymphs Abs: 1.8 10*3/uL (ref 0.7–4.0)
MCH: 28.8 pg (ref 26.0–34.0)
MCHC: 31.4 g/dL (ref 30.0–36.0)
MCV: 91.7 fL (ref 80.0–100.0)
Monocytes Absolute: 0.4 10*3/uL (ref 0.1–1.0)
Monocytes Relative: 6 %
Neutro Abs: 4 10*3/uL (ref 1.7–7.7)
Neutrophils Relative %: 61 %
Platelet Count: 345 10*3/uL (ref 150–400)
RBC: 3.99 MIL/uL (ref 3.87–5.11)
RDW: 14.6 % (ref 11.5–15.5)
WBC Count: 6.4 10*3/uL (ref 4.0–10.5)
nRBC: 0 % (ref 0.0–0.2)

## 2019-06-22 LAB — CMP (CANCER CENTER ONLY)
ALT: 13 U/L (ref 0–44)
AST: 14 U/L — ABNORMAL LOW (ref 15–41)
Albumin: 3.3 g/dL — ABNORMAL LOW (ref 3.5–5.0)
Alkaline Phosphatase: 84 U/L (ref 38–126)
Anion gap: 9 (ref 5–15)
BUN: 8 mg/dL (ref 6–20)
CO2: 26 mmol/L (ref 22–32)
Calcium: 8.6 mg/dL — ABNORMAL LOW (ref 8.9–10.3)
Chloride: 106 mmol/L (ref 98–111)
Creatinine: 1.05 mg/dL — ABNORMAL HIGH (ref 0.44–1.00)
GFR, Est AFR Am: >60 mL/min (ref >60)
GFR, Estimated: >60 mL/min (ref >60)
Glucose, Bld: 102 mg/dL — ABNORMAL HIGH (ref 70–99)
Potassium: 4.4 mmol/L (ref 3.5–5.1)
Sodium: 141 mmol/L (ref 135–145)
Total Bilirubin: 0.3 mg/dL (ref 0.3–1.2)
Total Protein: 6.6 g/dL (ref 6.5–8.1)

## 2019-06-22 NOTE — Progress Notes (Signed)
Hematology and Oncology Follow Up Visit  Kayla Mendoza [id number] April 27, 1976 43 y.o. 06/22/2019 8:54 AM   Principle Diagnosis: 43 year old woman with monoclonal gammopathy diagnosed in 2016.  She was found to have IgG kappa MGUS without any evidence to suggest multiple myeloma.  Reactive monoclonal gammopathy is also a possibility related to autoimmune disorder.  Current therapy: Active surveillance.  Interim History: Ms. Kayla Mendoza is here for return evaluation.  Since the last visit, he reports no major changes in her health.  She remains active and continues to attend to activities of daily living.  She does report chronic joint pain including knees and back as well as myalgias related to fibromyalgia.  She denies any recent hospitalizations or illnesses.  She denies any recurrent infections or neuropathy.   She denied any alteration mental status, neuropathy, confusion or dizziness.  Denies any headaches or lethargy.  Denies any night sweats, weight loss or changes in appetite.  Denied orthopnea, dyspnea on exertion or chest discomfort.  Denies shortness of breath, difficulty breathing hemoptysis or cough.  Denies any abdominal distention, nausea, early satiety or dyspepsia.  Denies any hematuria, frequency, dysuria or nocturia.  Denies any skin irritation, dryness or rash.  Denies any ecchymosis or petechiae.  Denies any lymphadenopathy or clotting.  Denies any heat or cold intolerance.  Denies any anxiety or depression.  Remaining review of system is negative.       Medications: Updated on review.   Current Outpatient Medications  Medication Sig Dispense Refill  . acetaminophen (TYLENOL) 325 MG tablet Take 650 mg by mouth as needed.    . ALPRAZolam (XANAX) 1 MG tablet Take 1 mg by mouth 3 (three) times daily as needed for anxiety.    Marland Kitchen buPROPion (WELLBUTRIN SR) 150 MG 12 hr tablet Take 150 mg by mouth 2 (two) times daily.    Marland Kitchen buPROPion (WELLBUTRIN XL) 300 MG 24 hr tablet Take 300 mg by  mouth daily.    . celecoxib (CELEBREX) 200 MG capsule Take 1 capsule (200 mg total) by mouth 2 (two) times daily. 40 capsule 0  . diclofenac sodium (VOLTAREN) 1 % GEL APPLY 2-4 GRAMS TO LARGE JOINT AREA UP TO FOUR TIMES A DAY AS NEEDED 10 Tube 3  . diclofenac sodium (VOLTAREN) 1 % GEL Apply 2-4 grams to large joint area up to four times a day as needed 2 Tube 3  . dicyclomine (BENTYL) 20 MG tablet Take 20 mg by mouth every 6 (six) hours.    . DULoxetine (CYMBALTA) 30 MG capsule Take 30 mg by mouth daily.    . DULoxetine (CYMBALTA) 60 MG capsule Take 120 mg by mouth daily.    Marland Kitchen esomeprazole (NEXIUM) 40 MG capsule Take 40 mg by mouth 2 (two) times daily.  5  . esomeprazole (NEXIUM) 40 MG capsule Take 40 mg by mouth daily at 12 noon.    Marland Kitchen lisinopril (PRINIVIL,ZESTRIL) 10 MG tablet Take 10 mg by mouth daily.    Marland Kitchen lisinopril (PRINIVIL,ZESTRIL) 40 MG tablet Take 40 mg by mouth daily.     . metoprolol succinate (TOPROL-XL) 50 MG 24 hr tablet Take 50 mg by mouth daily. Take with or immediately following a meal.    . naproxen (NAPROSYN) 500 MG tablet Take 1 tablet (500 mg total) by mouth 2 (two) times daily. 30 tablet 0  . propranolol (INDERAL) 60 MG tablet Take 60 mg by mouth daily.     . QUEtiapine (SEROQUEL) 400 MG tablet Take 400 mg by mouth  at bedtime.    . ranitidine (ZANTAC) 150 MG tablet Take 150 mg by mouth at bedtime.  12  . sucralfate (CARAFATE) 1 g tablet 1 TABLET ON AN EMPTY STOMACH BEFORE MEALS AND AT BEDTIME ORALLY 30 DAYS  3  . tizanidine (ZANAFLEX) 2 MG capsule Take 2 mg by mouth daily.     Marland Kitchen tiZANidine (ZANAFLEX) 2 MG tablet Take by mouth every 6 (six) hours as needed for muscle spasms.    . valACYclovir (VALTREX) 1000 MG tablet Take 1,000 mg by mouth daily.    . Vitamin D, Ergocalciferol, (DRISDOL) 50000 units CAPS capsule Take 50,000 Units by mouth once a week.  3   No current facility-administered medications for this visit.      Allergies:  Allergies  Allergen Reactions  .  Neurontin [Gabapentin] Other (See Comments)    Caused chest pain  . Shellfish Allergy Hives  . Neurontin [Gabapentin]   . Tramadol Hcl   . Tramadol Itching and Rash    Past Medical History, Surgical history, Social history, and Family History without any changes on review.  Physical Exam: Blood pressure (!) 112/54, pulse 63, temperature 98.5 F (36.9 C), temperature source Temporal, resp. rate 18, height '5\' 4"'  (1.626 m), weight 264 lb 9.6 oz (120 kg), last menstrual period 03/28/2014, SpO2 98 %.   ECOG: 0    General appearance: Comfortable appearing without any discomfort Head: Normocephalic without any trauma Oropharynx: Mucous membranes are moist and pink without any thrush or ulcers. Eyes: Pupils are equal and round reactive to light. Lymph nodes: No cervical, supraclavicular, inguinal or axillary lymphadenopathy.   Heart:regular rate and rhythm.  S1 and S2 without leg edema. Lung: Clear without any rhonchi or wheezes.  No dullness to percussion. Abdomin: Soft, nontender, nondistended with good bowel sounds.  No hepatosplenomegaly. Musculoskeletal: No joint deformity or effusion.  Full range of motion noted. Neurological: No deficits noted on motor, sensory and deep tendon reflex exam. Skin: No petechial rash or dryness.  Appeared moist.  Psychiatric: Mood and affect appeared appropriate.      Lab Results: Lab Results  Component Value Date   WBC 6.4 06/22/2019   HGB 11.5 (L) 06/22/2019   HCT 36.6 06/22/2019   MCV 91.7 06/22/2019   PLT 345 06/22/2019     Chemistry      Component Value Date/Time   NA 143 06/22/2018 0836   NA 140 06/23/2017 0835   K 3.9 06/22/2018 0836   K 3.9 06/23/2017 0835   CL 108 06/22/2018 0836   CO2 28 06/22/2018 0836   CO2 28 06/23/2017 0835   BUN 7 06/22/2018 0836   BUN 8.0 06/23/2017 0835   CREATININE 0.83 06/22/2018 0836   CREATININE 0.8 06/23/2017 0835      Component Value Date/Time   CALCIUM 8.8 (L) 06/22/2018 0836   CALCIUM  9.0 06/23/2017 0835   ALKPHOS 98 06/22/2018 0836   ALKPHOS 116 06/23/2017 0835   AST 14 (L) 06/22/2018 0836   AST 17 06/23/2017 0835   ALT 15 06/22/2018 0836   ALT 11 06/23/2017 0835   BILITOT 0.3 06/22/2018 0836   BILITOT 0.40 06/23/2017 0835     Results for CALE, DECAROLIS (MRN [id number]) as of 06/22/2019 08:48  Ref. Range 06/18/2016 08:58 06/23/2017 08:36 06/22/2018 08:37  M Protein SerPl Elph-Mcnc Latest Ref Range: Not Observed g/dL 0.6 (H) 0.8 (H) 0.8 (H)     Results for DANICKA, HOURIHAN (MRN [id number]) as of 06/22/2019 08:48  Ref. Range  06/19/2015 09:38 06/18/2016 08:58 06/23/2017 08:36 06/22/2018 08:37  IgG (Immunoglobin G), Serum Latest Ref Range: 700 - 1,600 mg/dL 1,480 1,619 (H) 1,674 (H) 1,523  IgM, Serum Latest Ref Range: 52 - 322 mg/dL 108       Impression and Plan:   43 year old woman with:  1.  Monoclonal gammopathy diagnosed in 2016.  She was found to have IgG kappa subtype.  The etiology is related to MGUS this is reactive findings.  He remains on active surveillance without any indication to suggest symptomatic myeloma.  Protein studies obtained in 2019 showed an M spike of less than 1 g/dL that has not changed dramatically in the last 4 years.  Her IgG level at 1523 is lower than what it was in 2017.  The natural course of this disease as well as the differential diagnosis was reiterated.  At this time I see no evidence to suggest symptomatic plasma cell disorder that requires intervention.  I have recommended continued active surveillance and repeat protein studies annually.  Bone marrow biopsy or skeletal survey may be needed if she develops signs and symptoms of progression.   2. Diffuse bone pain: Related to fibromyalgia without any suggestion of plasma cell disorder.  3. Follow-up: In 1 year for repeat evaluation.  15  minutes was spent with the patient face-to-face today.  More than 50% of time was spent on updating her disease status, reviewing laboratory data as well as  answering questions regarding future plan of care.    Zola Button, MD 9/4/20208:54 AM

## 2019-06-26 LAB — MULTIPLE MYELOMA PANEL, SERUM
Albumin SerPl Elph-Mcnc: 3.1 g/dL (ref 2.9–4.4)
Albumin/Glob SerPl: 1.1 (ref 0.7–1.7)
Alpha 1: 0.2 g/dL (ref 0.0–0.4)
Alpha2 Glob SerPl Elph-Mcnc: 0.9 g/dL (ref 0.4–1.0)
B-Globulin SerPl Elph-Mcnc: 0.9 g/dL (ref 0.7–1.3)
Gamma Glob SerPl Elph-Mcnc: 1.1 g/dL (ref 0.4–1.8)
Globulin, Total: 3.1 g/dL (ref 2.2–3.9)
IgA: 59 mg/dL — ABNORMAL LOW (ref 87–352)
IgG (Immunoglobin G), Serum: 1322 mg/dL (ref 586–1602)
IgM (Immunoglobulin M), Srm: 30 mg/dL (ref 26–217)
M Protein SerPl Elph-Mcnc: 0.6 g/dL — ABNORMAL HIGH
Total Protein ELP: 6.2 g/dL (ref 6.0–8.5)

## 2019-06-26 LAB — KAPPA/LAMBDA LIGHT CHAINS
Kappa free light chain: 29.5 mg/L — ABNORMAL HIGH (ref 3.3–19.4)
Kappa, lambda light chain ratio: 2.5 — ABNORMAL HIGH (ref 0.26–1.65)
Lambda free light chains: 11.8 mg/L (ref 5.7–26.3)

## 2019-07-25 ENCOUNTER — Ambulatory Visit
Admission: EM | Admit: 2019-07-25 | Discharge: 2019-07-25 | Disposition: A | Discharge status: Home / Self Care | Payer: Medicaid Other | Attending: Physician Assistant | Admitting: Physician Assistant

## 2019-07-25 DIAGNOSIS — Z20828 Contact with and (suspected) exposure to other viral communicable diseases: Secondary | ICD-10-CM

## 2019-07-25 DIAGNOSIS — R059 Cough, unspecified: Secondary | ICD-10-CM

## 2019-07-25 DIAGNOSIS — R05 Cough: Secondary | ICD-10-CM

## 2019-07-25 HISTORY — DX: Essential (primary) hypertension: I10

## 2019-07-25 MED ORDER — BENZONATATE 100 MG PO CAPS: 100.0000 mg | ORAL_CAPSULE | Freq: Three times a day (TID) | ORAL | 0 refills | Status: DC

## 2019-07-25 MED ORDER — IPRATROPIUM BROMIDE 0.06 % NA SOLN: 2.0000 | Freq: Four times a day (QID) | NASAL | 0 refills | Status: DC

## 2019-07-25 NOTE — ED Provider Notes (Signed)
[name]    CSN: [id number] Arrival date & time: 07/25/19  1858      History   Chief Complaint Chief Complaint  Patient presents with  . Cough    HPI Kayla Mendoza is a 43 y.o. female.   43 year old female comes in for 5-day history of productive cough.  Denies fever, chills, body aches.  Denies rhinorrhea, nasal congestion, sore throat.  Denies abdominal pain, nausea, vomiting, diarrhea.  Denies shortness of breath, loss of taste or smell.  She does feel like there may be some postnasal drainage.  No known sick/COVID contact.  Has been taking Mucinex and allergy medicine without much relief.  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     Past Medical History:  Diagnosis Date  . Abnormal uterine bleeding (AUB)   . Anxiety   . Arthritis of back   . Bladder neoplasm   . DDD (degenerative disc disease), lumbar   . Depression   . Fibromyalgia   . GERD (gastroesophageal reflux disease)   . H/O hiatal hernia   . Hypertension   . Migraines   . Nocturia   . Pelvic pain in female   . Pinched vertebral nerve   . RLS (restless legs syndrome)   . Scoliosis   . Sleep apnea   . Urgency of urination     Patient Active Problem List   Diagnosis Date Noted  . Lipoma of back 02/22/2017  . Fibromyalgia syndrome 11/10/2016  . Other fatigue 11/10/2016  . Primary insomnia 11/10/2016  . Primary osteoarthritis of both knees 11/10/2016  . Primary osteoarthritis of both hands 11/10/2016  . Vitamin D deficiency 11/10/2016  . DDD (degenerative disc disease), lumbar 11/10/2016  . History of anxiety 11/10/2016  . History of restless legs syndrome 11/10/2016  . Pelvic pain in female 04/17/2014  . Back pain 07/23/2013  . Lumbar radiculopathy 07/23/2013  . Migraine without aura, with intractable migraine, so stated, without mention of status migrainosus 06/28/2013  . Tension headache 06/28/2013  . HYPERTHYROIDISM 10/27/2010  . GENERALIZED  ANXIETY DISORDER 10/27/2010  . GERD 10/27/2010  . ANKLE SPRAIN, LEFT 10/27/2010    Past Surgical History:  Procedure Laterality Date  . ABDOMINAL HYSTERECTOMY    . CYSTOSCOPY N/A 04/17/2014   Procedure: CYSTOSCOPY;  Surgeon: [name], MD;  Location: [facility];  Service: Gynecology;  Laterality: N/A;  clean/contaminated  . CYSTOSCOPY WITH BIOPSY N/A 03/08/2014   Procedure: CYSTOSCOPY WITH  TURBT SMALL HYDROTENSION OF BLADDER INSTALLATION OF MARCAINE AND  PYRIDIUM;  Surgeon: [name], MD;  Location: [facility];  Service: Urology;  Laterality: N/A;  . ESSURE TUBAL LIGATION  2010  . EXCISION OF BACK LESION Right 02/22/2017   Procedure: EXCISION OF MASS RIGHT BACK;  Surgeon: [name], MD;  Location: [facility];  Service: General;  Laterality: Right;  . LAPAROSCOPIC ASSISTED VAGINAL HYSTERECTOMY Bilateral 04/17/2014   Procedure: LAPAROSCOPIC ASSISTED VAGINAL HYSTERECTOMY, Bilateral Salpingectomy;  Surgeon: [name], MD;  Location: [facility];  Service: Gynecology;  Laterality: Bilateral;  clean/contaminated  . LIPOMA EXCISION    . NEGATIVE SLEEP STUDY  2013    OB History   No obstetric history on file.      Home Medications    Prior to Admission medications   Medication Sig Start Date End Date Taking? Authorizing Provider  acetaminophen (TYLENOL) 325 MG tablet Take 650 mg by mouth as needed. 09/15/16   [provider]  ALPRAZolam (XANAX) 1 MG tablet Take 1 mg by mouth 3 (three) times daily as needed for anxiety.    [provider]  benzonatate (TESSALON) 100 MG capsule Take 1 capsule (100 mg total) by mouth every 8 (eight) hours. 07/25/19   [name],  V, PA-C  buPROPion (WELLBUTRIN XL) 300 MG 24 hr tablet Take 300 mg by mouth daily.    [provider]  diclofenac sodium (VOLTAREN) 1 % GEL APPLY 2-4 GRAMS TO LARGE JOINT AREA UP TO FOUR TIMES A DAY AS NEEDED 05/11/18   [name], MD  diclofenac sodium (VOLTAREN) 1  % GEL Apply 2-4 grams to large joint area up to four times a day as needed 05/16/18   [name], MD  DULoxetine (CYMBALTA) 60 MG capsule Take 120 mg by mouth daily.    [provider]  esomeprazole (NEXIUM) 40 MG capsule Take 40 mg by mouth 2 (two) times daily. 03/25/18   [provider]  ipratropium (ATROVENT) 0.06 % nasal spray Place 2 sprays into both nostrils 4 (four) times daily. 07/25/19   [name],  V, PA-C  lisinopril (PRINIVIL,ZESTRIL) 10 MG tablet Take 10 mg by mouth daily.    [provider]  naproxen (NAPROSYN) 500 MG tablet Take 1 tablet (500 mg total) by mouth 2 (two) times daily. 01/15/18   [name], PA-C  propranolol (INDERAL) 60 MG tablet Take 60 mg by mouth daily.     [provider]  ranitidine (ZANTAC) 150 MG tablet Take 150 mg by mouth at bedtime. 12/16/17   [provider]  sucralfate (CARAFATE) 1 g tablet 1 TABLET ON AN EMPTY STOMACH BEFORE MEALS AND AT BEDTIME ORALLY 30 DAYS 04/09/18   [provider]  tizanidine (ZANAFLEX) 2 MG capsule Take 2 mg by mouth daily.     [provider]  valACYclovir (VALTREX) 1000 MG tablet Take 1,000 mg by mouth daily.    [provider]  Vitamin D, Ergocalciferol, (DRISDOL) 50000 units CAPS capsule Take 50,000 Units by mouth once a week. 10/19/17   [provider]    Family History Family History  Problem Relation Age of Onset  . Diabetes Paternal Grandmother   . Diabetes Paternal Grandfather   . Hypertension Maternal Grandfather   . Breast cancer Paternal Aunt     Social History Social History   Tobacco Use  . Smoking status: Never Smoker  . Smokeless tobacco: Never Used  Substance Use Topics  . Alcohol use: No  . Drug use: No     Allergies   Neurontin [gabapentin], Shellfish allergy, Neurontin [gabapentin], Tramadol hcl, and Tramadol   Review of Systems Review of Systems  Reason unable to perform ROS: See HPI as above.     Physical  Exam Triage Vital Signs ED Triage Vitals  Enc Vitals Group     BP 07/25/19 1908 (!) 142/94     Pulse Rate 07/25/19 1908 64     Resp 07/25/19 1908 18     Temp 07/25/19 1908 99.2 F (37.3 C)     Temp Source 07/25/19 1908 Oral     SpO2 07/25/19 1908 96 %     Weight --      Height --      Head Circumference --      Peak Flow --      Pain Score 07/25/19 1909 4     Pain Loc --      Pain Edu? --  Excl. in GC? --    No data found.  Updated Vital Signs BP (!) 142/94 (BP Location: Left Arm)   Pulse 64   Temp 99.2 F (37.3 C) (Oral)   Resp 18   LMP 03/28/2014   SpO2 96%   Visual Acuity Right Eye Distance:   Left Eye Distance:   Bilateral Distance:    Right Eye Near:   Left Eye Near:    Bilateral Near:     Physical Exam Constitutional:      General: She is not in acute distress.    Appearance: Normal appearance. She is not ill-appearing, toxic-appearing or diaphoretic.  HENT:     Head: Normocephalic and atraumatic.     Mouth/Throat:     Mouth: Mucous membranes are moist.     Pharynx: Oropharynx is clear. Uvula midline.  Neck:     Musculoskeletal: Normal range of motion and neck supple.  Cardiovascular:     Rate and Rhythm: Normal rate and regular rhythm.     Heart sounds: Normal heart sounds. No murmur. No friction rub. No gallop.   Pulmonary:     Effort: Pulmonary effort is normal. No accessory muscle usage, prolonged expiration, respiratory distress or retractions.     Comments: Lungs clear to auscultation without adventitious lung sounds. Neurological:     General: No focal deficit present.     Mental Status: She is alert and oriented to person, place, and time.      UC Treatments / Results  Labs (all labs ordered are listed, but only abnormal results are displayed) Labs Reviewed  NOVEL CORONAVIRUS, NAA    EKG   Radiology No results found.  Procedures Procedures (including critical care time)  Medications Ordered in UC Medications - No data  to display  Initial Impression / Assessment and Plan / UC Course  I have reviewed the triage vital signs and the nursing notes.  Pertinent labs & imaging results that were available during my care of the patient were reviewed by me and considered in my medical decision making (see chart for details).    No alarming signs on exam.  Patient speaking in full sentences without respiratory distress.  COVID testing ordered.  Patient to quarantine until testing results return.  Symptomatic treatment discussed.  Push fluids.  Return precautions given.  Patient expresses understanding and agrees to plan.  Final Clinical Impressions(s) / UC Diagnoses   Final diagnoses:  Cough   ED Prescriptions    Medication Sig Dispense Auth. Provider   benzonatate (TESSALON) 100 MG capsule Take 1 capsule (100 mg total) by mouth every 8 (eight) hours. 21 capsule ,  V, PA-C   ipratropium (ATROVENT) 0.06 % nasal spray Place 2 sprays into both nostrils 4 (four) times daily. 15 mL Ok Edwards, PA-C     PDMP not reviewed this encounter.   Ok Edwards, PA-C 07/25/19 1932

## 2019-07-25 NOTE — Discharge Instructions (Signed)
As discussed, cannot rule out COVID. Currently, no alarming signs. Testing ordered. I would like you to quarantine until testing results.  Start tessalon for cough. Atrovent nasal spray for possible post nasal drip causing mucous. If experiencing shortness of breath, trouble breathing, call 911 and provide them with your current situation.

## 2019-07-25 NOTE — ED Triage Notes (Signed)
Pt c/o productive cough with yellow sputum x5 days, her job wants her tested for COVID

## 2019-07-28 LAB — NOVEL CORONAVIRUS, NAA: SARS-CoV-2, NAA: NOT DETECTED

## 2019-11-21 ENCOUNTER — Other Ambulatory Visit: Payer: Self-pay | Admitting: Nurse Practitioner

## 2019-11-21 DIAGNOSIS — Z1231 Encounter for screening mammogram for malignant neoplasm of breast: Secondary | ICD-10-CM

## 2020-01-02 ENCOUNTER — Ambulatory Visit: Payer: Medicaid Other

## 2020-01-11 ENCOUNTER — Ambulatory Visit
Admission: EM | Admit: 2020-01-11 | Discharge: 2020-01-11 | Disposition: A | Discharge status: Home / Self Care | Payer: Medicaid Other | Attending: Family Medicine | Admitting: Family Medicine

## 2020-01-11 DIAGNOSIS — R0981 Nasal congestion: Secondary | ICD-10-CM

## 2020-01-11 DIAGNOSIS — J029 Acute pharyngitis, unspecified: Secondary | ICD-10-CM

## 2020-01-11 DIAGNOSIS — R059 Cough, unspecified: Secondary | ICD-10-CM

## 2020-01-11 DIAGNOSIS — R05 Cough: Secondary | ICD-10-CM

## 2020-01-11 DIAGNOSIS — B349 Viral infection, unspecified: Secondary | ICD-10-CM

## 2020-01-11 DIAGNOSIS — R519 Headache, unspecified: Secondary | ICD-10-CM | POA: Diagnosis not present

## 2020-01-11 MED ORDER — KETOROLAC TROMETHAMINE 30 MG/ML IJ SOLN
30.0000 mg | Freq: Once | INTRAMUSCULAR | Status: AC
  Administered 2020-01-11: 30 mg via INTRAMUSCULAR

## 2020-01-11 NOTE — ED Provider Notes (Signed)
[name]    CSN: [id number] Arrival date & time: 01/11/20  1229      History   Chief Complaint Chief Complaint  Patient presents with  . Headache    HPI Kayla Mendoza is a 44 y.o. female.   Patient is a 44 year old female with past medical history of anxiety, arthritis, degenerative disc disease, depression, fibromyalgia, GERD, hypertension, migraines, restless leg.  She presents today with cough, congestion, sore throat and headache for the past 5 days.  Symptoms have been constant, waxing waning.  Headache that worsened today.  She had ibuprofen with minimal relief of the headache.  No fever, chills, body aches, night sweats.  ROS per HPI      Past Medical History:  Diagnosis Date  . Abnormal uterine bleeding (AUB)   . Anxiety   . Arthritis of back   . Bladder neoplasm   . DDD (degenerative disc disease), lumbar   . Depression   . Fibromyalgia   . GERD (gastroesophageal reflux disease)   . H/O hiatal hernia   . Hypertension   . Migraines   . Nocturia   . Pelvic pain in female   . Pinched vertebral nerve   . RLS (restless legs syndrome)   . Scoliosis   . Sleep apnea   . Urgency of urination     Patient Active Problem List   Diagnosis Date Noted  . Lipoma of back 02/22/2017  . Fibromyalgia syndrome 11/10/2016  . Other fatigue 11/10/2016  . Primary insomnia 11/10/2016  . Primary osteoarthritis of both knees 11/10/2016  . Primary osteoarthritis of both hands 11/10/2016  . Vitamin D deficiency 11/10/2016  . DDD (degenerative disc disease), lumbar 11/10/2016  . History of anxiety 11/10/2016  . History of restless legs syndrome 11/10/2016  . Pelvic pain in female 04/17/2014  . Back pain 07/23/2013  . Lumbar radiculopathy 07/23/2013  . Migraine without aura, with intractable migraine, so stated, without mention of status migrainosus 06/28/2013  . Tension headache 06/28/2013  . HYPERTHYROIDISM 10/27/2010  . GENERALIZED ANXIETY DISORDER  10/27/2010  . GERD 10/27/2010  . ANKLE SPRAIN, LEFT 10/27/2010    Past Surgical History:  Procedure Laterality Date  . ABDOMINAL HYSTERECTOMY    . CYSTOSCOPY N/A 04/17/2014   Procedure: CYSTOSCOPY;  Surgeon: [name], MD;  Location: [facility];  Service: Gynecology;  Laterality: N/A;  clean/contaminated  . CYSTOSCOPY WITH BIOPSY N/A 03/08/2014   Procedure: CYSTOSCOPY WITH  TURBT SMALL HYDROTENSION OF BLADDER INSTALLATION OF MARCAINE AND  PYRIDIUM;  Surgeon: [name], MD;  Location: [facility];  Service: Urology;  Laterality: N/A;  . ESSURE TUBAL LIGATION  2010  . EXCISION OF BACK LESION Right 02/22/2017   Procedure: EXCISION OF MASS RIGHT BACK;  Surgeon: [name], MD;  Location: [facility];  Service: General;  Laterality: Right;  . LAPAROSCOPIC ASSISTED VAGINAL HYSTERECTOMY Bilateral 04/17/2014   Procedure: LAPAROSCOPIC ASSISTED VAGINAL HYSTERECTOMY, Bilateral Salpingectomy;  Surgeon: [name], MD;  Location: [facility];  Service: Gynecology;  Laterality: Bilateral;  clean/contaminated  . LIPOMA EXCISION    . NEGATIVE SLEEP STUDY  2013    OB History   No obstetric history on file.      Home Medications    Prior to Admission medications   Medication Sig Start Date End Date Taking? Authorizing Provider  acetaminophen (TYLENOL) 325 MG tablet Take 650 mg by mouth as needed. 09/15/16   [provider]  ALPRAZolam Duanne Moron) 1 MG tablet Take 1 mg  by mouth 3 (three) times daily as needed for anxiety.    [provider]  buPROPion (WELLBUTRIN XL) 300 MG 24 hr tablet Take 300 mg by mouth daily.    [provider]  diclofenac sodium (VOLTAREN) 1 % GEL APPLY 2-4 GRAMS TO LARGE JOINT AREA UP TO FOUR TIMES A DAY AS NEEDED 05/11/18   [name], MD  diclofenac sodium (VOLTAREN) 1 % GEL Apply 2-4 grams to large joint area up to four times a day as needed 05/16/18   [name], MD  DULoxetine (CYMBALTA) 60 MG capsule  Take 120 mg by mouth daily.    [provider]  naproxen (NAPROSYN) 500 MG tablet Take 1 tablet (500 mg total) by mouth 2 (two) times daily. 01/15/18   [name], PA-C  propranolol (INDERAL) 60 MG tablet Take 60 mg by mouth daily.     [provider]  tizanidine (ZANAFLEX) 2 MG capsule Take 2 mg by mouth daily.     [provider]  valACYclovir (VALTREX) 1000 MG tablet Take 1,000 mg by mouth daily.    [provider]  Vitamin D, Ergocalciferol, (DRISDOL) 50000 units CAPS capsule Take 50,000 Units by mouth once a week. 10/19/17   [provider]    Family History Family History  Problem Relation Age of Onset  . Diabetes Paternal Grandmother   . Diabetes Paternal Grandfather   . Hypertension Maternal Grandfather   . Breast cancer Paternal Aunt     Social History Social History   Tobacco Use  . Smoking status: Never Smoker  . Smokeless tobacco: Never Used  Substance Use Topics  . Alcohol use: No  . Drug use: No     Allergies   Neurontin [gabapentin], Shellfish allergy, Neurontin [gabapentin], Tramadol hcl, and Tramadol   Review of Systems Review of Systems   Physical Exam Triage Vital Signs ED Triage Vitals [01/11/20 1243]  Enc Vitals Group     BP (!) 147/90     Pulse Rate (!) 106     Resp 18     Temp (!) 97.3 F (36.3 C)     Temp Source Oral     SpO2 95 %     Weight      Height      Head Circumference      Peak Flow      Pain Score 10     Pain Loc      Pain Edu?      Excl. in [place]?    No data found.  Updated Vital Signs BP (!) 147/90 (BP Location: Left Arm)   Pulse (!) 106   Temp (!) 97.3 F (36.3 C) (Oral)   Resp 18   LMP 03/28/2014   SpO2 95%   Visual Acuity Right Eye Distance:   Left Eye Distance:   Bilateral Distance:    Right Eye Near:   Left Eye Near:    Bilateral Near:     Physical Exam Vitals and nursing note reviewed.  Constitutional:      General: She is not in acute distress.     Appearance: Normal appearance. She is not ill-appearing, toxic-appearing or diaphoretic.  HENT:     Head: Normocephalic and atraumatic.     Right Ear: Tympanic membrane and ear canal normal.     Left Ear: Tympanic membrane and ear canal normal.     Nose: Nose normal.     Mouth/Throat:     Pharynx: Oropharynx is  clear.  Eyes:     Conjunctiva/sclera: Conjunctivae normal.  Cardiovascular:     Rate and Rhythm: Normal rate and regular rhythm.     Pulses: Normal pulses.     Heart sounds: Normal heart sounds.  Pulmonary:     Effort: Pulmonary effort is normal.     Breath sounds: Normal breath sounds.  Musculoskeletal:        General: Normal range of motion.     Cervical back: Normal range of motion.  Skin:    General: Skin is warm and dry.  Neurological:     Mental Status: She is alert.  Psychiatric:        Mood and Affect: Mood normal.      UC Treatments / Results  Labs (all labs ordered are listed, but only abnormal results are displayed) Labs Reviewed  NOVEL CORONAVIRUS, NAA    EKG   Radiology No results found.  Procedures Procedures (including critical care time)  Medications Ordered in UC Medications  ketorolac (TORADOL) 30 MG/ML injection 30 mg (30 mg Intramuscular Given 01/11/20 1315)    Initial Impression / Assessment and Plan / UC Course  I have reviewed the triage vital signs and the nursing notes.  Pertinent labs & imaging results that were available during my care of the patient were reviewed by me and considered in my medical decision making (see chart for details).     Viral illness-Covid swab sent for testing Exam benign Toradol given here for headache. Recommend over-the-counter medications as needed for symptoms. Follow up as needed for continued or worsening symptoms  Final Clinical Impressions(s) / UC Diagnoses   Final diagnoses:  Viral illness  Cough     Discharge Instructions     This is most likely some sort of viral illness. He  can use over-the-counter medications as needed for your symptoms. Toradol given here for headache We should have your Covid results tomorrow.  You can check your MyChart for results    ED Prescriptions    None     PDMP not reviewed this encounter.   Orvan July, NP 01/11/20 1528

## 2020-01-11 NOTE — Discharge Instructions (Addendum)
This is most likely some sort of viral illness. He can use over-the-counter medications as needed for your symptoms. Toradol given here for headache We should have your Covid results tomorrow.  You can check your MyChart for results

## 2020-01-11 NOTE — ED Triage Notes (Signed)
Pt c/o cough, congestion and sore throat since Monday. States woke up with a sever headache this am.

## 2020-01-12 ENCOUNTER — Telehealth (HOSPITAL_COMMUNITY): Payer: Self-pay

## 2020-01-12 LAB — NOVEL CORONAVIRUS, NAA: SARS-CoV-2, NAA: DETECTED — AB

## 2020-01-12 LAB — SARS-COV-2, NAA 2 DAY TAT

## 2020-01-12 NOTE — Telephone Encounter (Signed)
Patient contacted by phone and made aware of  COVID  results. Pt verbalized understanding and had all questions answered.  Your test for COVID-19 was positive, meaning that you were infected with the novel coronavirus and could give the germ to others.  Please continue isolation at home for at least 10 days since the start of your symptoms. If you do not have symptoms, please isolate at home for 10 days from the day you were tested. Once you complete your 10 day quarantine, you may return to normal activities as long as you've not had a fever for over 24 hours(without taking fever reducing medicine) and your symptoms are improving. Please continue good preventive care measures, including:  frequent hand-washing, avoid touching your face, cover coughs/sneezes, stay out of crowds and keep a 6 foot distance from others.  Go to the nearest hospital emergency room if fever/cough/breathlessness are severe or illness seems like a threat to life.

## 2020-01-13 ENCOUNTER — Other Ambulatory Visit: Payer: Self-pay | Admitting: Unknown Physician Specialty

## 2020-01-13 ENCOUNTER — Telehealth: Payer: Self-pay | Admitting: Unknown Physician Specialty

## 2020-01-13 DIAGNOSIS — U071 COVID-19: Secondary | ICD-10-CM

## 2020-01-13 MED ORDER — SODIUM CHLORIDE 0.9 % IV SOLN
700.0000 mg | Freq: Once | INTRAVENOUS | Status: AC
  Administered 2020-01-14: 700 mg via INTRAVENOUS
  Filled 2020-01-13: qty 700

## 2020-01-13 NOTE — Telephone Encounter (Signed)
  I connected by phone with Kayla Mendoza on 01/13/2020 at 8:19 AM to discuss the potential use of an new treatment for mild to moderate COVID-19 viral infection in non-hospitalized patients.  This patient is a 44 y.o. female that meets the FDA criteria for Emergency Use Authorization of bamlanivimab or casirivimab\imdevimab.  Has a (+) direct SARS-CoV-2 viral test result  Has mild or moderate COVID-19   Is ? 44 years of age and weighs ? 40 kg  Is NOT hospitalized due to COVID-19  Is NOT requiring oxygen therapy or requiring an increase in baseline oxygen flow rate due to COVID-19  Is within 10 days of symptom onset  Has at least one of the high risk factor(s) for progression to severe COVID-19 and/or hospitalization as defined in EUA.  Specific high risk criteria : BMI >/= 35   I have spoken and communicated the following to the patient or parent/caregiver:  1. FDA has authorized the emergency use of bamlanivimab and casirivimab\imdevimab for the treatment of mild to moderate COVID-19 in adults and pediatric patients with positive results of direct SARS-CoV-2 viral testing who are 34 years of age and older weighing at least 40 kg, and who are at high risk for progressing to severe COVID-19 and/or hospitalization.  2. The significant known and potential risks and benefits of bamlanivimab and casirivimab\imdevimab, and the extent to which such potential risks and benefits are unknown.  3. Information on available alternative treatments and the risks and benefits of those alternatives, including clinical trials.  4. Patients treated with bamlanivimab and casirivimab\imdevimab should continue to self-isolate and use infection control measures (e.g., wear mask, isolate, social distance, avoid sharing personal items, clean and disinfect "high touch" surfaces, and frequent handwashing) according to CDC guidelines.   5. The patient or parent/caregiver has the option to accept or refuse  bamlanivimab or casirivimab\imdevimab .  After reviewing this information with the patient, The patient agreed to proceed with receiving the bamlanimivab infusion and will be provided a copy of the Fact sheet prior to receiving the infusion.Kathrine Haddock 01/13/2020 8:19 AM

## 2020-01-14 ENCOUNTER — Ambulatory Visit (HOSPITAL_COMMUNITY)
Admission: RE | Admit: 2020-01-14 | Discharge: 2020-01-14 | Disposition: A | Discharge status: Home / Self Care | Payer: Medicaid Other | Source: Ambulatory Visit | Attending: Pulmonary Disease | Admitting: Pulmonary Disease

## 2020-01-14 DIAGNOSIS — U071 COVID-19: Secondary | ICD-10-CM | POA: Diagnosis not present

## 2020-01-14 MED ORDER — METHYLPREDNISOLONE SODIUM SUCC 125 MG IJ SOLR: 125.0000 mg | Freq: Once | INTRAMUSCULAR | Status: DC | PRN

## 2020-01-14 MED ORDER — SODIUM CHLORIDE 0.9 % IV SOLN: INTRAVENOUS | Status: DC | PRN

## 2020-01-14 MED ORDER — EPINEPHRINE 0.3 MG/0.3ML IJ SOAJ: 0.3000 mg | Freq: Once | INTRAMUSCULAR | Status: DC | PRN

## 2020-01-14 MED ORDER — DIPHENHYDRAMINE HCL 50 MG/ML IJ SOLN: 50.0000 mg | Freq: Once | INTRAMUSCULAR | Status: DC | PRN

## 2020-01-14 MED ORDER — ALBUTEROL SULFATE HFA 108 (90 BASE) MCG/ACT IN AERS: 2.0000 | INHALATION_SPRAY | Freq: Once | RESPIRATORY_TRACT | Status: DC | PRN

## 2020-01-14 MED ORDER — FAMOTIDINE IN NACL 20-0.9 MG/50ML-% IV SOLN: 20.0000 mg | Freq: Once | INTRAVENOUS | Status: DC | PRN

## 2020-01-14 NOTE — Discharge Instructions (Signed)
COVID-19 COVID-19 is a respiratory infection that is caused by a virus called severe acute respiratory syndrome coronavirus 2 (SARS-CoV-2). The disease is also known as coronavirus disease or novel coronavirus. In some people, the virus may not cause any symptoms. In others, it may cause a serious infection. The infection can get worse quickly and can lead to complications, such as:  Pneumonia, or infection of the lungs.  Acute respiratory distress syndrome or ARDS. This is a condition in which fluid build-up in the lungs prevents the lungs from filling with air and passing oxygen into the blood.  Acute respiratory failure. This is a condition in which there is not enough oxygen passing from the lungs to the body or when carbon dioxide is not passing from the lungs out of the body.  Sepsis or septic shock. This is a serious bodily reaction to an infection.  Blood clotting problems.  Secondary infections due to bacteria or fungus.  Organ failure. This is when your body's organs stop working. The virus that causes COVID-19 is contagious. This means that it can spread from person to person through droplets from coughs and sneezes (respiratory secretions). What are the causes? This illness is caused by a virus. You may catch the virus by:  Breathing in droplets from an infected person. Droplets can be spread by a person breathing, speaking, singing, coughing, or sneezing.  Touching something, like a table or a doorknob, that was exposed to the virus (contaminated) and then touching your mouth, nose, or eyes. What increases the risk? Risk for infection You are more likely to be infected with this virus if you:  Are within 6 feet (2 meters) of a person with COVID-19.  Provide care for or live with a person who is infected with COVID-19.  Spend time in crowded indoor spaces or live in shared housing. Risk for serious illness You are more likely to become seriously ill from the virus if  you:  Are 50 years of age or older. The higher your age, the more you are at risk for serious illness.  Live in a nursing home or long-term care facility.  Have cancer.  Have a long-term (chronic) disease such as: ? Chronic lung disease, including chronic obstructive pulmonary disease or asthma. ? A long-term disease that lowers your body's ability to fight infection (immunocompromised). ? Heart disease, including heart failure, a condition in which the arteries that lead to the heart become narrow or blocked (coronary artery disease), a disease which makes the heart muscle thick, weak, or stiff (cardiomyopathy). ? Diabetes. ? Chronic kidney disease. ? Sickle cell disease, a condition in which red blood cells have an abnormal "sickle" shape. ? Liver disease.  Are obese. What are the signs or symptoms? Symptoms of this condition can range from mild to severe. Symptoms may appear any time from 2 to 14 days after being exposed to the virus. They include:  A fever or chills.  A cough.  Difficulty breathing.  Headaches, body aches, or muscle aches.  Runny or stuffy (congested) nose.  A sore throat.  New loss of taste or smell. Some people may also have stomach problems, such as nausea, vomiting, or diarrhea. Other people may not have any symptoms of COVID-19. How is this diagnosed? This condition may be diagnosed based on:  Your signs and symptoms, especially if: ? You live in an area with a COVID-19 outbreak. ? You recently traveled to or from an area where the virus is common. ? You   provide care for or live with a person who was diagnosed with COVID-19. ? You were exposed to a person who was diagnosed with COVID-19.  A physical exam.  Lab tests, which may include: ? Taking a sample of fluid from the back of your nose and throat (nasopharyngeal fluid), your nose, or your throat using a swab. ? A sample of mucus from your lungs (sputum). ? Blood tests.  Imaging tests,  which may include, X-rays, CT scan, or ultrasound. How is this treated? At present, there is no medicine to treat COVID-19. Medicines that treat other diseases are being used on a trial basis to see if they are effective against COVID-19. Your health care provider will talk with you about ways to treat your symptoms. For most people, the infection is mild and can be managed at home with rest, fluids, and over-the-counter medicines. Treatment for a serious infection usually takes places in a hospital intensive care unit (ICU). It may include one or more of the following treatments. These treatments are given until your symptoms improve.  Receiving fluids and medicines through an IV.  Supplemental oxygen. Extra oxygen is given through a tube in the nose, a face mask, or a hood.  Positioning you to lie on your stomach (prone position). This makes it easier for oxygen to get into the lungs.  Continuous positive airway pressure (CPAP) or bi-level positive airway pressure (BPAP) machine. This treatment uses mild air pressure to keep the airways open. A tube that is connected to a motor delivers oxygen to the body.  Ventilator. This treatment moves air into and out of the lungs by using a tube that is placed in your windpipe.  Tracheostomy. This is a procedure to create a hole in the neck so that a breathing tube can be inserted.  Extracorporeal membrane oxygenation (ECMO). This procedure gives the lungs a chance to recover by taking over the functions of the heart and lungs. It supplies oxygen to the body and removes carbon dioxide. Follow these instructions at home: Lifestyle  If you are sick, stay home except to get medical care. Your health care provider will tell you how long to stay home. Call your health care provider before you go for medical care.  Rest at home as told by your health care provider.  Do not use any products that contain nicotine or tobacco, such as cigarettes,  e-cigarettes, and chewing tobacco. If you need help quitting, ask your health care provider.  Return to your normal activities as told by your health care provider. Ask your health care provider what activities are safe for you. General instructions  Take over-the-counter and prescription medicines only as told by your health care provider.  Drink enough fluid to keep your urine pale yellow.  Keep all follow-up visits as told by your health care provider. This is important. How is this prevented?  There is no vaccine to help prevent COVID-19 infection. However, there are steps you can take to protect yourself and others from this virus. To protect yourself:   Do not travel to areas where COVID-19 is a risk. The areas where COVID-19 is reported change often. To identify high-risk areas and travel restrictions, check the CDC travel website: wwwnc.cdc.gov/travel/notices  If you live in, or must travel to, an area where COVID-19 is a risk, take precautions to avoid infection. ? Stay away from people who are sick. ? Wash your hands often with soap and water for 20 seconds. If soap and water   are not available, use an alcohol-based hand sanitizer. ? Avoid touching your mouth, face, eyes, or nose. ? Avoid going out in public, follow guidance from your state and local health authorities. ? If you must go out in public, wear a cloth face covering or face mask. Make sure your mask covers your nose and mouth. ? Avoid crowded indoor spaces. Stay at least 6 feet (2 meters) away from others. ? Disinfect objects and surfaces that are frequently touched every day. This may include:  Counters and tables.  Doorknobs and light switches.  Sinks and faucets.  Electronics, such as phones, remote controls, keyboards, computers, and tablets. To protect others: If you have symptoms of COVID-19, take steps to prevent the virus from spreading to others.  If you think you have a COVID-19 infection, contact  your health care provider right away. Tell your health care team that you think you may have a COVID-19 infection.  Stay home. Leave your house only to seek medical care. Do not use public transport.  Do not travel while you are sick.  Wash your hands often with soap and water for 20 seconds. If soap and water are not available, use alcohol-based hand sanitizer.  Stay away from other members of your household. Let healthy household members care for children and pets, if possible. If you have to care for children or pets, wash your hands often and wear a mask. If possible, stay in your own room, separate from others. Use a different bathroom.  Make sure that all people in your household wash their hands well and often.  Cough or sneeze into a tissue or your sleeve or elbow. Do not cough or sneeze into your hand or into the air.  Wear a cloth face covering or face mask. Make sure your mask covers your nose and mouth. Where to find more information  Centers for Disease Control and Prevention: www.cdc.gov/coronavirus/2019-ncov/index.html  World Health Organization: www.who.int/health-topics/coronavirus Contact a health care provider if:  You live in or have traveled to an area where COVID-19 is a risk and you have symptoms of the infection.  You have had contact with someone who has COVID-19 and you have symptoms of the infection. Get help right away if:  You have trouble breathing.  You have pain or pressure in your chest.  You have confusion.  You have bluish lips and fingernails.  You have difficulty waking from sleep.  You have symptoms that get worse. These symptoms may represent a serious problem that is an emergency. Do not wait to see if the symptoms will go away. Get medical help right away. Call your local emergency services (911 in the [place] Do not drive yourself to the hospital. Let the emergency medical personnel know if you think you have  COVID-19. Summary  COVID-19 is a respiratory infection that is caused by a virus. It is also known as coronavirus disease or novel coronavirus. It can cause serious infections, such as pneumonia, acute respiratory distress syndrome, acute respiratory failure, or sepsis.  The virus that causes COVID-19 is contagious. This means that it can spread from person to person through droplets from breathing, speaking, singing, coughing, or sneezing.  You are more likely to develop a serious illness if you are 50 years of age or older, have a weak immune system, live in a nursing home, or have chronic disease.  There is no medicine to treat COVID-19. Your health care provider will talk with you about ways to treat your symptoms.    Take steps to protect yourself and others from infection. Wash your hands often and disinfect objects and surfaces that are frequently touched every day. Stay away from people who are sick and wear a mask if you are sick. This information is not intended to replace advice given to you by your health care provider. Make sure you discuss any questions you have with your health care provider. Document Revised: 08/03/2019 Document Reviewed: 11/09/2018 Elsevier Patient Education  2020 [employer]. What types of side effects do monoclonal antibody drugs cause?  Common side effects  In general, the more common side effects caused by monoclonal antibody drugs include: . Allergic reactions, such as hives or itching . Flu-like signs and symptoms, including chills, fatigue, fever, and muscle aches and pains . Nausea, vomiting . Diarrhea . Skin rashes . Low blood pressure   The CDC is recommending patients who receive monoclonal antibody treatments wait at least 90 days before being vaccinated.  Currently, there are no data on the safety and efficacy of mRNA COVID-19 vaccines in persons who received monoclonal antibodies or convalescent plasma as part of COVID-19 treatment. Based  on the estimated half-life of such therapies as well as evidence suggesting that reinfection is uncommon in the 90 days after initial infection, vaccination should be deferred for at least 90 days, as a precautionary measure until additional information becomes available, to avoid interference of the antibody treatment with vaccine-induced immune responses. 

## 2020-01-14 NOTE — Progress Notes (Signed)
  Diagnosis: COVID-19  Physician:Dr Joya Gaskins   Procedure: [facility] Med: bamlanivimab infusion - Provided patient with bamlanimivab fact sheet for patients, parents and caregivers prior to infusion.  Complications: No immediate complications noted.  Discharge: Discharged home   Kayla Mendoza 01/14/2020

## 2020-01-17 ENCOUNTER — Ambulatory Visit: Payer: Medicaid Other

## 2020-01-21 ENCOUNTER — Ambulatory Visit: Payer: Medicaid Other | Attending: Internal Medicine

## 2020-01-21 DIAGNOSIS — Z20822 Contact with and (suspected) exposure to covid-19: Secondary | ICD-10-CM

## 2020-01-22 LAB — SARS-COV-2, NAA 2 DAY TAT

## 2020-01-22 LAB — NOVEL CORONAVIRUS, NAA: SARS-CoV-2, NAA: DETECTED — AB

## 2021-04-08 ENCOUNTER — Emergency Department (INDEPENDENT_AMBULATORY_CARE_PROVIDER_SITE_OTHER)
Admission: EM | Admit: 2021-04-08 | Discharge: 2021-04-08 | Disposition: A | Discharge status: Home / Self Care | Payer: Medicaid Other | Source: Home / Self Care

## 2021-04-08 ENCOUNTER — Other Ambulatory Visit: Payer: Self-pay

## 2021-04-08 ENCOUNTER — Encounter: Payer: Self-pay | Admitting: Emergency Medicine

## 2021-04-08 DIAGNOSIS — R490 Dysphonia: Secondary | ICD-10-CM

## 2021-04-08 DIAGNOSIS — R059 Cough, unspecified: Secondary | ICD-10-CM | POA: Diagnosis not present

## 2021-04-08 DIAGNOSIS — R0982 Postnasal drip: Secondary | ICD-10-CM | POA: Diagnosis not present

## 2021-04-08 DIAGNOSIS — J069 Acute upper respiratory infection, unspecified: Secondary | ICD-10-CM

## 2021-04-08 MED ORDER — PREDNISONE 10 MG PO TABS: 20.0000 mg | ORAL_TABLET | Freq: Every day | ORAL | 0 refills | Status: AC

## 2021-04-08 MED ORDER — PROMETHAZINE-DM 6.25-15 MG/5ML PO SYRP: 5.0000 mL | ORAL_SOLUTION | Freq: Two times a day (BID) | ORAL | 0 refills | Status: DC | PRN

## 2021-04-08 NOTE — ED Provider Notes (Signed)
[facility]    CSN: [id number] Arrival date & time: 04/08/21  1927      History   Chief Complaint Chief Complaint  Patient presents with   Facial Pain    HPI Kayla Mendoza is a 45 y.o. female.   Patient presents today with a 2-day history of URI symptoms.  Reports facial pain, sinus pressure, drainage, hoarseness, cough.  She denies any shortness of breath, fever, chest pain, nausea, vomiting.  She has tried DayQuil, NyQuil, Human resources officer without improvement of symptoms.  Reports grandson has been sick with cold symptoms.  Reports she is up-to-date on COVID-19 vaccination but has not yet had booster.  She has not had an influenza shot this year.  Denies any recent antibiotic use.  She does have a history of allergies typically managed with as needed over-the-counter antihistamine; reports symptoms are more extreme than typical episodes of allergies.  She denies history of asthma, smoking, COPD.  Reports she is having difficulty with daily activities as result of symptoms.  Patient took COVID-19 test yesterday and reports this was negative.   Past Medical History:  Diagnosis Date   Abnormal uterine bleeding (AUB)    Anxiety    Arthritis of back    Bladder neoplasm    DDD (degenerative disc disease), lumbar    Depression    Fibromyalgia    GERD (gastroesophageal reflux disease)    H/O hiatal hernia    Hypertension    Migraines    Nocturia    Pelvic pain in female    Pinched vertebral nerve    RLS (restless legs syndrome)    Scoliosis    Sleep apnea    Urgency of urination     Patient Active Problem List   Diagnosis Date Noted   Lipoma of back 02/22/2017   Fibromyalgia syndrome 11/10/2016   Other fatigue 11/10/2016   Primary insomnia 11/10/2016   Primary osteoarthritis of both knees 11/10/2016   Primary osteoarthritis of both hands 11/10/2016   Vitamin D deficiency 11/10/2016   DDD (degenerative disc disease), lumbar 11/10/2016   History of anxiety 11/10/2016    History of restless legs syndrome 11/10/2016   Pelvic pain in female 04/17/2014   Back pain 07/23/2013   Lumbar radiculopathy 07/23/2013   Migraine without aura, with intractable migraine, so stated, without mention of status migrainosus 06/28/2013   Tension headache 06/28/2013   HYPERTHYROIDISM 10/27/2010   GENERALIZED ANXIETY DISORDER 10/27/2010   GERD 10/27/2010   ANKLE SPRAIN, LEFT 10/27/2010    Past Surgical History:  Procedure Laterality Date   ABDOMINAL HYSTERECTOMY     CYSTOSCOPY N/A 04/17/2014   Procedure: CYSTOSCOPY;  Surgeon: [name], MD;  Location: [facility];  Service: Gynecology;  Laterality: N/A;  clean/contaminated   CYSTOSCOPY WITH BIOPSY N/A 03/08/2014   Procedure: CYSTOSCOPY WITH  TURBT SMALL HYDROTENSION OF BLADDER INSTALLATION OF MARCAINE AND  PYRIDIUM;  Surgeon: [name], MD;  Location: [facility];  Service: Urology;  Laterality: N/A;   ESSURE TUBAL LIGATION  2010   EXCISION OF BACK LESION Right 02/22/2017   Procedure: EXCISION OF MASS RIGHT BACK;  Surgeon: [name], MD;  Location: [facility];  Service: General;  Laterality: Right;   LAPAROSCOPIC ASSISTED VAGINAL HYSTERECTOMY Bilateral 04/17/2014   Procedure: LAPAROSCOPIC ASSISTED VAGINAL HYSTERECTOMY, Bilateral Salpingectomy;  Surgeon: [name], MD;  Location: [facility];  Service: Gynecology;  Laterality: Bilateral;  clean/contaminated   LIPOMA EXCISION     NEGATIVE SLEEP STUDY  2013  OB History   No obstetric history on file.      Home Medications    Prior to Admission medications   Medication Sig Start Date End Date Taking? Authorizing Provider  acetaminophen (TYLENOL) 325 MG tablet Take 650 mg by mouth as needed. 09/15/16  Yes [provider]  ALPRAZolam Duanne Moron) 1 MG tablet Take 1 mg by mouth 3 (three) times daily as needed for anxiety.   Yes [provider]  buPROPion (WELLBUTRIN XL) 300 MG 24 hr tablet Take 300 mg by mouth daily.    Yes [provider]  DULoxetine (CYMBALTA) 60 MG capsule Take 120 mg by mouth daily.   Yes [provider]  predniSONE (DELTASONE) 10 MG tablet Take 2 tablets (20 mg total) by mouth daily for 4 days. 04/08/21 04/12/21 Yes Molleigh Huot K, PA-C  promethazine-dextromethorphan (PROMETHAZINE-DM) 6.25-15 MG/5ML syrup Take 5 mLs by mouth 2 (two) times daily as needed for cough. 04/08/21  Yes Dam Ashraf K, PA-C  propranolol (INDERAL) 60 MG tablet Take 60 mg by mouth daily.    Yes [provider]  tizanidine (ZANAFLEX) 2 MG capsule Take 2 mg by mouth daily.    Yes [provider]  valACYclovir (VALTREX) 1000 MG tablet Take 1,000 mg by mouth daily.   Yes [provider]    Family History Family History  Problem Relation Age of Onset   Diabetes Paternal Grandmother    Diabetes Paternal Grandfather    Hypertension Maternal Grandfather    Breast cancer Paternal Aunt     Social History Social History   Tobacco Use   Smoking status: Never   Smokeless tobacco: Never  Vaping Use   Vaping Use: Never used  Substance Use Topics   Alcohol use: No   Drug use: No     Allergies   Neurontin [gabapentin], Shellfish allergy, Neurontin [gabapentin], Tramadol hcl, and Tramadol   Review of Systems Review of Systems  Constitutional:  Positive for activity change. Negative for appetite change, fatigue and fever.  HENT:  Positive for congestion, postnasal drip, sneezing and voice change. Negative for sore throat.   Respiratory:  Positive for cough. Negative for shortness of breath.   Cardiovascular:  Negative for chest pain.  Gastrointestinal:  Negative for abdominal pain, diarrhea, nausea and vomiting.  Musculoskeletal:  Negative for arthralgias and myalgias.  Neurological:  Positive for headaches. Negative for dizziness and light-headedness.    Physical Exam Triage Vital Signs ED Triage Vitals  Enc Vitals Group     BP 04/08/21 1937 134/84     Pulse  Rate 04/08/21 1937 (!) 56     Resp --      Temp 04/08/21 1937 99.1 F (37.3 C)     Temp Source 04/08/21 1937 Oral     SpO2 04/08/21 1937 99 %     Weight --      Height --      Head Circumference --      Peak Flow --      Pain Score 04/08/21 1938 4     Pain Loc --      Pain Edu? --      Excl. in [place]? --    No data found.  Updated Vital Signs BP 134/84 (BP Location: Right Arm)   Pulse (!) 56   Temp 99.1 F (37.3 C) (Oral)   LMP 03/28/2014   SpO2 99%   Visual Acuity Right Eye Distance:   Left Eye Distance:   Bilateral Distance:  Right Eye Near:   Left Eye Near:    Bilateral Near:     Physical Exam Vitals reviewed.  Constitutional:      General: She is awake. She is not in acute distress.    Appearance: Normal appearance. She is normal weight. She is not ill-appearing.     Comments: Very pleasant female appears stated age in no acute distress sitting comfortably in exam room  HENT:     Head: Normocephalic and atraumatic.     Right Ear: Tympanic membrane, ear canal and external ear normal. Tympanic membrane is not erythematous or bulging.     Left Ear: Tympanic membrane, ear canal and external ear normal. Tympanic membrane is not erythematous or bulging.     Nose:     Right Sinus: Maxillary sinus tenderness and frontal sinus tenderness present.     Left Sinus: Maxillary sinus tenderness and frontal sinus tenderness present.     Mouth/Throat:     Pharynx: Uvula midline. Posterior oropharyngeal erythema present. No oropharyngeal exudate.     Comments: Moderate erythema and drainage present posterior oropharynx Cardiovascular:     Rate and Rhythm: Normal rate and regular rhythm.     Heart sounds: Normal heart sounds, S1 normal and S2 normal. No murmur heard. Pulmonary:     Effort: Pulmonary effort is normal.     Breath sounds: Normal breath sounds. No wheezing, rhonchi or rales.     Comments: Clear to auscultation bilaterally Lymphadenopathy:     Head:     Right  side of head: No submental, submandibular or tonsillar adenopathy.     Left side of head: No submental, submandibular or tonsillar adenopathy.     Cervical: No cervical adenopathy.  Psychiatric:        Behavior: Behavior is cooperative.     UC Treatments / Results  Labs (all labs ordered are listed, but only abnormal results are displayed) Labs Reviewed - No data to display  EKG   Radiology No results found.  Procedures Procedures (including critical care time)  Medications Ordered in UC Medications - No data to display  Initial Impression / Assessment and Plan / UC Course  I have reviewed the triage vital signs and the nursing notes.  Pertinent labs & imaging results that were available during my care of the patient were reviewed by me and considered in my medical decision making (see chart for details).      Patient declined repeat COVID-19 testing given negative result.  No evidence of acute infection that warrant initiation of antibiotics.  Discussed likely viral etiology given short duration of symptoms.  Patient was prescribed 20 mg prednisone with instruction not to take NSAIDs with this medication due to risk of GI bleeding.  She denies history of diabetes.  She was prescribed Promethazine DM to be used up to twice a day as needed for cough with instruction not to drive or drink alcohol this medication as drowsiness is a common side effect.  Recommend she use over-the-counter medications including Mucinex and Flonase for symptom relief.  Discussed alarm symptoms that warrant emergent evaluation.  Strict return precautions given to which patient expressed understanding.  Final Clinical Impressions(s) / UC Diagnoses   Final diagnoses:  URI with cough and congestion  Cough  Hoarseness  Post-nasal drainage     Discharge Instructions      Take prednisone 20 mg daily for 4 days.  While you are taking this no ibuprofen aspirin or Aleve.  Take Promethazine DM at  night.   You should not drive or drink alcohol with this medication as drowsiness is a common side effect.  Use Flonase and Mucinex for additional symptom relief.  Use a humidifier to help with cough at night.  If anything worsens please return for reevaluation.     ED Prescriptions     Medication Sig Dispense Auth. Provider   predniSONE (DELTASONE) 10 MG tablet Take 2 tablets (20 mg total) by mouth daily for 4 days. 8 tablet Dryden Tapley K, PA-C   promethazine-dextromethorphan (PROMETHAZINE-DM) 6.25-15 MG/5ML syrup Take 5 mLs by mouth 2 (two) times daily as needed for cough. 118 mL Aleeya Veitch K, PA-C      PDMP not reviewed this encounter.   Terrilee Croak, PA-C 04/08/21 2007

## 2021-04-08 NOTE — ED Triage Notes (Signed)
Patient c/o sinus drainage, head congestion, drainage down throat x 2 days.  Patient has been taken Allegra, Ibuprofen.  Patient is vaccinated against COVID.

## 2021-04-08 NOTE — Discharge Instructions (Signed)
Take prednisone 20 mg daily for 4 days.  While you are taking this no ibuprofen aspirin or Aleve.  Take Promethazine DM at night.  You should not drive or drink alcohol with this medication as drowsiness is a common side effect.  Use Flonase and Mucinex for additional symptom relief.  Use a humidifier to help with cough at night.  If anything worsens please return for reevaluation.

## 2021-04-24 ENCOUNTER — Other Ambulatory Visit: Payer: Self-pay | Admitting: Nurse Practitioner

## 2021-04-24 DIAGNOSIS — Z1231 Encounter for screening mammogram for malignant neoplasm of breast: Secondary | ICD-10-CM

## 2021-06-19 ENCOUNTER — Ambulatory Visit: Payer: Medicaid Other

## 2021-07-08 ENCOUNTER — Encounter: Payer: Self-pay | Admitting: Emergency Medicine

## 2021-07-08 ENCOUNTER — Ambulatory Visit
Admission: EM | Admit: 2021-07-08 | Discharge: 2021-07-08 | Disposition: A | Discharge status: Home / Self Care | Payer: Medicaid Other | Attending: Urgent Care | Admitting: Urgent Care

## 2021-07-08 ENCOUNTER — Other Ambulatory Visit: Payer: Self-pay

## 2021-07-08 DIAGNOSIS — J069 Acute upper respiratory infection, unspecified: Secondary | ICD-10-CM

## 2021-07-08 DIAGNOSIS — R0981 Nasal congestion: Secondary | ICD-10-CM

## 2021-07-08 MED ORDER — BENZONATATE 100 MG PO CAPS: 100.0000 mg | ORAL_CAPSULE | Freq: Three times a day (TID) | ORAL | 0 refills | Status: DC | PRN

## 2021-07-08 MED ORDER — CETIRIZINE HCL 10 MG PO TABS: 10.0000 mg | ORAL_TABLET | Freq: Every day | ORAL | 0 refills | Status: DC

## 2021-07-08 MED ORDER — PSEUDOEPHEDRINE HCL 60 MG PO TABS: 60.0000 mg | ORAL_TABLET | Freq: Three times a day (TID) | ORAL | 0 refills | Status: DC | PRN

## 2021-07-08 MED ORDER — PROMETHAZINE-DM 6.25-15 MG/5ML PO SYRP: 5.0000 mL | ORAL_SOLUTION | Freq: Every evening | ORAL | 0 refills | Status: DC | PRN

## 2021-07-08 NOTE — ED Provider Notes (Signed)
[facility]   MRN: [id number] DOB: 1976-05-10  Subjective:   Kayla Mendoza is a 45 y.o. female presenting for 1 day history of acute onset sinus headache, sinus congestion, throat pain, cough.  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  Has used over-the-counter cold and flu medications.  Denies chest pain, shortness of breath, wheezing, nausea, vomiting, abdominal pain, body aches.  No history of respiratory disorders.  Patient is not a smoker.  No history of heart conditions or abnormal heart rhythms.  No current facility-administered medications for this encounter.  Current Outpatient Medications:    acetaminophen (TYLENOL) 325 MG tablet, Take 650 mg by mouth as needed., Disp: , Rfl:    ALPRAZolam (XANAX) 1 MG tablet, Take 1 mg by mouth 3 (three) times daily as needed for anxiety., Disp: , Rfl:    buPROPion (WELLBUTRIN XL) 300 MG 24 hr tablet, Take 300 mg by mouth daily., Disp: , Rfl:    DULoxetine (CYMBALTA) 60 MG capsule, Take 120 mg by mouth daily., Disp: , Rfl:    propranolol (INDERAL) 60 MG tablet, Take 60 mg by mouth daily. , Disp: , Rfl:    tizanidine (ZANAFLEX) 2 MG capsule, Take 2 mg by mouth daily. , Disp: , Rfl:    valACYclovir (VALTREX) 1000 MG tablet, Take 1,000 mg by mouth daily., Disp: , Rfl:    promethazine-dextromethorphan (PROMETHAZINE-DM) 6.25-15 MG/5ML syrup, Take 5 mLs by mouth 2 (two) times daily as needed for cough., Disp: 118 mL, Rfl: 0   Allergies  Allergen Reactions   Neurontin [Gabapentin] Other (See Comments)    Caused chest pain   Shellfish Allergy Hives   Neurontin [Gabapentin]    Tramadol Hcl    Tramadol Itching and Rash    Past Medical History:  Diagnosis Date   Abnormal uterine bleeding (AUB)    Anxiety    Arthritis of back    Bladder neoplasm    DDD (degenerative disc disease), lumbar    Depression    Fibromyalgia    GERD (gastroesophageal reflux disease)     H/O hiatal hernia    Hypertension    Migraines    Nocturia    Pelvic pain in female    Pinched vertebral nerve    RLS (restless legs syndrome)    Scoliosis    Sleep apnea    Urgency of urination      Past Surgical History:  Procedure Laterality Date   ABDOMINAL HYSTERECTOMY     CYSTOSCOPY N/A 04/17/2014   Procedure: CYSTOSCOPY;  Surgeon: [name], MD;  Location: [facility];  Service: Gynecology;  Laterality: N/A;  clean/contaminated   CYSTOSCOPY WITH BIOPSY N/A 03/08/2014   Procedure: CYSTOSCOPY WITH  TURBT SMALL HYDROTENSION OF BLADDER INSTALLATION OF MARCAINE AND  PYRIDIUM;  Surgeon: [name], MD;  Location: [facility];  Service: Urology;  Laterality: N/A;   ESSURE TUBAL LIGATION  2010   EXCISION OF BACK LESION Right 02/22/2017   Procedure: EXCISION OF MASS RIGHT BACK;  Surgeon: [name], MD;  Location: [place];  Service: General;  Laterality: Right;   LAPAROSCOPIC ASSISTED VAGINAL HYSTERECTOMY Bilateral 04/17/2014   Procedure: LAPAROSCOPIC ASSISTED VAGINAL HYSTERECTOMY, Bilateral Salpingectomy;  Surgeon: [name], MD;  Location: [facility];  Service: Gynecology;  Laterality: Bilateral;  clean/contaminated   LIPOMA EXCISION     NEGATIVE SLEEP STUDY  2013    Family History  Problem Relation Age  of Onset   Diabetes Paternal Grandmother    Diabetes Paternal Grandfather    Hypertension Maternal Grandfather    Breast cancer Paternal Aunt     Social History   Tobacco Use   Smoking status: Never   Smokeless tobacco: Never  Vaping Use   Vaping Use: Never used  Substance Use Topics   Alcohol use: No   Drug use: No    ROS   Objective:   Vitals: BP 133/84 (BP Location: Left Arm)   Pulse 68   Temp 98.6 F (37 C) (Oral)   Ht 5\' 4"  (1.626 m)   Wt 240 lb (108.9 kg)   LMP 03/28/2014   SpO2 100%   BMI 41.20 kg/m   Physical Exam Constitutional:      General: She is not in acute distress.    Appearance: Normal  appearance. She is well-developed. She is not ill-appearing, toxic-appearing or diaphoretic.  HENT:     Head: Normocephalic and atraumatic.     Right Ear: Tympanic membrane, ear canal and external ear normal. No drainage or tenderness. No middle ear effusion. There is no impacted cerumen. Tympanic membrane is not erythematous.     Left Ear: Tympanic membrane, ear canal and external ear normal. No drainage or tenderness.  No middle ear effusion. There is no impacted cerumen. Tympanic membrane is not erythematous.     Nose: Nose normal. No congestion or rhinorrhea.     Mouth/Throat:     Mouth: Mucous membranes are moist. No oral lesions.     Pharynx: No pharyngeal swelling, oropharyngeal exudate, posterior oropharyngeal erythema or uvula swelling.     Tonsils: No tonsillar exudate or tonsillar abscesses.  Eyes:     General: No scleral icterus.       Right eye: No discharge.        Left eye: No discharge.     Extraocular Movements: Extraocular movements intact.     Right eye: Normal extraocular motion.     Left eye: Normal extraocular motion.     Conjunctiva/sclera: Conjunctivae normal.     Pupils: Pupils are equal, round, and reactive to light.  Cardiovascular:     Rate and Rhythm: Normal rate and regular rhythm.     Pulses: Normal pulses.     Heart sounds: Normal heart sounds. No murmur heard.   No friction rub. No gallop.  Pulmonary:     Effort: Pulmonary effort is normal. No respiratory distress.     Breath sounds: Normal breath sounds. No stridor. No wheezing, rhonchi or rales.  Musculoskeletal:     Cervical back: Normal range of motion and neck supple.  Lymphadenopathy:     Cervical: No cervical adenopathy.  Skin:    General: Skin is warm and dry.     Findings: No rash.  Neurological:     General: No focal deficit present.     Mental Status: She is alert and oriented to person, place, and time.  Psychiatric:        Mood and Affect: Mood normal.        Behavior: Behavior  normal.        Thought Content: Thought content normal.        Judgment: Judgment normal.     Assessment and Plan :   PDMP not reviewed this encounter.  1. Viral URI with cough   2. Nasal congestion     Will manage for viral illness such as viral URI, viral syndrome, viral rhinitis, COVID-19. Counseled patient on  nature of COVID-19 including modes of transmission, diagnostic testing, management and supportive care.  Offered scripts for symptomatic relief. COVID 19 testing is pending. Counseled patient on potential for adverse effects with medications prescribed/recommended today, ER and return-to-clinic precautions discussed, patient verbalized understanding.     Jaynee Eagles, [place] 07/08/21 [id number]

## 2021-07-08 NOTE — ED Triage Notes (Signed)
Patient c/o nasal congestion, sore throat and headache since last night.  Patient is vaccinated for COVID.  Patient has taken Cold & Flu, Ibuprofen and Claritin.

## 2021-07-08 NOTE — Discharge Instructions (Signed)

## 2021-07-09 LAB — NOVEL CORONAVIRUS, NAA: SARS-CoV-2, NAA: NOT DETECTED

## 2021-07-09 LAB — SARS-COV-2, NAA 2 DAY TAT

## 2021-08-11 ENCOUNTER — Other Ambulatory Visit: Payer: Self-pay

## 2021-08-11 ENCOUNTER — Ambulatory Visit
Admission: RE | Admit: 2021-08-11 | Discharge: 2021-08-11 | Disposition: A | Discharge status: Home / Self Care | Payer: Medicaid Other | Source: Ambulatory Visit | Attending: Nurse Practitioner | Admitting: Nurse Practitioner

## 2021-08-11 DIAGNOSIS — Z1231 Encounter for screening mammogram for malignant neoplasm of breast: Secondary | ICD-10-CM

## 2021-08-17 ENCOUNTER — Other Ambulatory Visit: Payer: Self-pay | Admitting: Nurse Practitioner

## 2021-08-17 DIAGNOSIS — R928 Other abnormal and inconclusive findings on diagnostic imaging of breast: Secondary | ICD-10-CM

## 2021-08-29 ENCOUNTER — Other Ambulatory Visit: Payer: Self-pay | Admitting: Nurse Practitioner

## 2021-08-29 ENCOUNTER — Ambulatory Visit
Admission: RE | Admit: 2021-08-29 | Discharge: 2021-08-29 | Disposition: A | Discharge status: Home / Self Care | Payer: Medicaid Other | Source: Ambulatory Visit | Attending: Nurse Practitioner | Admitting: Nurse Practitioner

## 2021-08-29 DIAGNOSIS — R928 Other abnormal and inconclusive findings on diagnostic imaging of breast: Secondary | ICD-10-CM

## 2021-08-29 DIAGNOSIS — N6310 Unspecified lump in the right breast, unspecified quadrant: Secondary | ICD-10-CM

## 2021-09-04 ENCOUNTER — Ambulatory Visit
Admission: RE | Admit: 2021-09-04 | Discharge: 2021-09-04 | Disposition: A | Discharge status: Home / Self Care | Payer: Medicaid Other | Source: Ambulatory Visit | Attending: Nurse Practitioner | Admitting: Nurse Practitioner

## 2021-09-04 DIAGNOSIS — N6310 Unspecified lump in the right breast, unspecified quadrant: Secondary | ICD-10-CM

## 2021-09-04 HISTORY — PX: BREAST BIOPSY: SHX20

## 2021-09-07 ENCOUNTER — Other Ambulatory Visit: Payer: Medicaid Other

## 2022-02-13 ENCOUNTER — Other Ambulatory Visit: Payer: Self-pay

## 2022-02-13 ENCOUNTER — Encounter (HOSPITAL_BASED_OUTPATIENT_CLINIC_OR_DEPARTMENT_OTHER): Payer: Self-pay

## 2022-02-13 ENCOUNTER — Emergency Department (HOSPITAL_BASED_OUTPATIENT_CLINIC_OR_DEPARTMENT_OTHER)
Admission: EM | Admit: 2022-02-13 | Discharge: 2022-02-13 | Disposition: A | Discharge status: Home / Self Care | Payer: Medicaid Other | Attending: Emergency Medicine | Admitting: Emergency Medicine

## 2022-02-13 DIAGNOSIS — L2082 Flexural eczema: Secondary | ICD-10-CM | POA: Insufficient documentation

## 2022-02-13 DIAGNOSIS — L03114 Cellulitis of left upper limb: Secondary | ICD-10-CM | POA: Insufficient documentation

## 2022-02-13 DIAGNOSIS — L02416 Cutaneous abscess of left lower limb: Secondary | ICD-10-CM | POA: Diagnosis present

## 2022-02-13 DIAGNOSIS — E039 Hypothyroidism, unspecified: Secondary | ICD-10-CM | POA: Insufficient documentation

## 2022-02-13 MED ORDER — DOXYCYCLINE HYCLATE 100 MG PO CAPS: 100.0000 mg | ORAL_CAPSULE | Freq: Two times a day (BID) | ORAL | 0 refills | Status: DC

## 2022-02-13 MED ORDER — TRIAMCINOLONE ACETONIDE 0.1 % EX CREA: 1.0000 "application " | TOPICAL_CREAM | Freq: Two times a day (BID) | CUTANEOUS | 1 refills | Status: DC

## 2022-02-13 NOTE — Discharge Instructions (Addendum)
Return if your rash, infection worsens including worsening redness, pain, purulent drainage despite antibiotic usage. ? ?As we discussed this doxycycline should be taken on a full stomach, and may increase your sun sensitivity so I recommend avoiding the sun or wearing good sunscreen while taking this medication. ? ?You can use the topical steroid cream I am prescribing over areas of eczema.  I recommend that you avoid excessively hot showers.  You may want to follow-up with dermatology if your symptoms fail to improve despite treatment. ?

## 2022-02-13 NOTE — ED Triage Notes (Signed)
Pt presents POV with a wound to her Left AC area. Reports it started as a sore 3 days ago and then began "oozing" not having pain with any movement. No drainage noted in triage.  ?

## 2022-02-13 NOTE — ED Provider Notes (Signed)
?[place] EMERGENCY DEPT ?Provider Note ? ? ?CSN: [id number] ?Arrival date & time: 02/13/22  1513 ? ?  ? ?History ? ?Chief Complaint  ?Patient presents with  ? Abscess  ? ? ?Kayla Mendoza is a 46 y.o. female with a past medical history significant for anxiety, hypothyroidism, fibromyalgia who presents with concern for wound to the left [facility] area.  Patient reports that it started as a sore 3 days ago and began oozing.  She reports that it is painful, red, feels firm to the touch.  Patient reports that she has some eczema in the area and she has been scratching it.  She reports that the only treatment she has for her eczema is lotion.  Patient denies systemic fever, chills. ? ? ?Abscess ? ?  ? ?Home Medications ?Prior to Admission medications   ?Medication Sig Start Date End Date Taking? Authorizing Provider  ?doxycycline (VIBRAMYCIN) 100 MG capsule Take 1 capsule (100 mg total) by mouth 2 (two) times daily. 02/13/22  Yes Dennis Killilea H, PA-C  ?triamcinolone cream (KENALOG) 0.1 % Apply 1 application. topically 2 (two) times daily. Apply thin layer twice daily to affected areas of eczema, do not use in any 1 area for greater than 2 weeks at a time without a break. 02/13/22  Yes Ammie Warrick H, PA-C  ?acetaminophen (TYLENOL) 325 MG tablet Take 650 mg by mouth as needed. 09/15/16   [provider]  ?ALPRAZolam Duanne Moron) 1 MG tablet Take 1 mg by mouth 3 (three) times daily as needed for anxiety.    [provider]  ?benzonatate (TESSALON) 100 MG capsule Take 1-2 capsules (100-200 mg total) by mouth 3 (three) times daily as needed for cough. 07/08/21   Jaynee Eagles, PA-C  ?buPROPion (WELLBUTRIN XL) 300 MG 24 hr tablet Take 300 mg by mouth daily.    [provider]  ?cetirizine (ZYRTEC ALLERGY) 10 MG tablet Take 1 tablet (10 mg total) by mouth daily. 07/08/21   Jaynee Eagles, PA-C  ?DULoxetine (CYMBALTA) 60 MG capsule Take 120 mg by mouth daily.    [provider]   ?promethazine-dextromethorphan (PROMETHAZINE-DM) 6.25-15 MG/5ML syrup Take 5 mLs by mouth at bedtime as needed for cough. 07/08/21   Jaynee Eagles, PA-C  ?propranolol (INDERAL) 60 MG tablet Take 60 mg by mouth daily.     [provider]  ?pseudoephedrine (SUDAFED) 60 MG tablet Take 1 tablet (60 mg total) by mouth every 8 (eight) hours as needed for congestion. 07/08/21   Jaynee Eagles, PA-C  ?tizanidine (ZANAFLEX) 2 MG capsule Take 2 mg by mouth daily.     [provider]  ?valACYclovir (VALTREX) 1000 MG tablet Take 1,000 mg by mouth daily.    [provider]  ?   ? ?Allergies    ?Neurontin [gabapentin], Shellfish allergy, Neurontin [gabapentin], Tramadol hcl, and Tramadol   ? ?Review of Systems   ?Review of Systems  ?Skin:  Positive for wound.  ?All other systems reviewed and are negative. ? ?Physical Exam ?Updated Vital Signs ?BP 130/88   Pulse (!) 125   Temp 99.1 ?F (37.3 ?C)   Resp 18   LMP 03/28/2014   SpO2 99%  ?Physical Exam ?Vitals and nursing note reviewed.  ?Constitutional:   ?   General: She is not in acute distress. ?   Appearance: Normal appearance.  ?HENT:  ?   Head: Normocephalic and atraumatic.  ?Eyes:  ?   General:     ?   Right eye: No discharge.     ?  Left eye: No discharge.  ?Cardiovascular:  ?   Rate and Rhythm: Normal rate and regular rhythm.  ?Pulmonary:  ?   Effort: Pulmonary effort is normal. No respiratory distress.  ?Musculoskeletal:     ?   General: No deformity.  ?Skin: ?   General: Skin is warm and dry.  ?   Comments: Patient with eczematous changes, excoriation noted to the left [facility] with cellulitis.  Ultrasound examination does not show any discrete fluid collection, there is some cobblestoning which is consistent with cellulitis.  ?Neurological:  ?   Mental Status: She is alert and oriented to person, place, and time.  ?Psychiatric:     ?   Mood and Affect: Mood normal.     ?   Behavior: Behavior normal.  ? ? ?ED Results / Procedures / Treatments    ?Labs ?(all labs ordered are listed, but only abnormal results are displayed) ?Labs Reviewed - No data to display ? ?EKG ?None ? ?Radiology ?No results found. ? ?Procedures ?Procedures  ? ? ?Medications Ordered in ED ?Medications - No data to display ? ?ED Course/ Medical Decision Making/ A&P ?  ?                        ?Medical Decision Making ?Risk ?Prescription drug management. ? ? ?Is an overall well-appearing female with noncontributory past medical history who presents with concern for wound to the left AC.  She reports that she has had some eczematous skin changes and has been scratching at that area.  She reports that it started to become painful, red, firm, and draining pus.  Clinically patient with appearance of eczema with overlying cellulitis.  Ultrasound examination reveals cobblestoning but no discrete fluid collection.  Do not believe that this would benefit from any incision and drainage.  Discussed I recommend triamcinolone for eczema, doxycycline to cover for cellulitis.  Encourage follow-up for wound check with PCP or return to the emergency department if patient has any questions or concerns about worsening infection.  Patient discharged in stable condition at this time, return precautions given. ?Final Clinical Impression(s) / ED Diagnoses ?Final diagnoses:  ?Cellulitis of left upper extremity  ?Flexural eczema  ? ? ?Rx / DC Orders ?ED Discharge Orders   ? ?      Ordered  ?  doxycycline (VIBRAMYCIN) 100 MG capsule  2 times daily       ? 02/13/22 1733  ?  triamcinolone cream (KENALOG) 0.1 %  2 times daily       ? 02/13/22 1733  ? ?  ?  ? ?  ? ? ?  ?Anselmo Pickler, PA-C ?02/13/22 1751 ? ?  ?Tegeler, Gwenyth Allegra, MD ?02/13/22 2313 ? ?

## 2022-02-15 ENCOUNTER — Inpatient Hospital Stay (HOSPITAL_BASED_OUTPATIENT_CLINIC_OR_DEPARTMENT_OTHER)
Admission: EM | Admit: 2022-02-15 | Discharge: 2022-02-17 | DRG: 603 | Disposition: A | Discharge status: Home / Self Care | Payer: Medicaid Other | Attending: Internal Medicine | Admitting: Internal Medicine

## 2022-02-15 ENCOUNTER — Other Ambulatory Visit: Payer: Self-pay

## 2022-02-15 ENCOUNTER — Encounter (HOSPITAL_BASED_OUTPATIENT_CLINIC_OR_DEPARTMENT_OTHER): Payer: Self-pay

## 2022-02-15 DIAGNOSIS — Z885 Allergy status to narcotic agent status: Secondary | ICD-10-CM

## 2022-02-15 DIAGNOSIS — L0390 Cellulitis, unspecified: Secondary | ICD-10-CM | POA: Diagnosis not present

## 2022-02-15 DIAGNOSIS — G43909 Migraine, unspecified, not intractable, without status migrainosus: Secondary | ICD-10-CM | POA: Diagnosis present

## 2022-02-15 DIAGNOSIS — J020 Streptococcal pharyngitis: Secondary | ICD-10-CM | POA: Diagnosis present

## 2022-02-15 DIAGNOSIS — Z20822 Contact with and (suspected) exposure to covid-19: Secondary | ICD-10-CM | POA: Diagnosis present

## 2022-02-15 DIAGNOSIS — G2581 Restless legs syndrome: Secondary | ICD-10-CM | POA: Diagnosis present

## 2022-02-15 DIAGNOSIS — Z90710 Acquired absence of both cervix and uterus: Secondary | ICD-10-CM

## 2022-02-15 DIAGNOSIS — M419 Scoliosis, unspecified: Secondary | ICD-10-CM | POA: Diagnosis present

## 2022-02-15 DIAGNOSIS — F32A Depression, unspecified: Secondary | ICD-10-CM | POA: Diagnosis present

## 2022-02-15 DIAGNOSIS — E876 Hypokalemia: Secondary | ICD-10-CM | POA: Diagnosis present

## 2022-02-15 DIAGNOSIS — K219 Gastro-esophageal reflux disease without esophagitis: Secondary | ICD-10-CM | POA: Diagnosis present

## 2022-02-15 DIAGNOSIS — L03114 Cellulitis of left upper limb: Principal | ICD-10-CM | POA: Diagnosis present

## 2022-02-15 DIAGNOSIS — I10 Essential (primary) hypertension: Secondary | ICD-10-CM | POA: Diagnosis present

## 2022-02-15 DIAGNOSIS — M797 Fibromyalgia: Secondary | ICD-10-CM | POA: Diagnosis present

## 2022-02-15 DIAGNOSIS — Z79899 Other long term (current) drug therapy: Secondary | ICD-10-CM | POA: Diagnosis not present

## 2022-02-15 DIAGNOSIS — G4730 Sleep apnea, unspecified: Secondary | ICD-10-CM | POA: Diagnosis present

## 2022-02-15 DIAGNOSIS — Z833 Family history of diabetes mellitus: Secondary | ICD-10-CM

## 2022-02-15 DIAGNOSIS — Z803 Family history of malignant neoplasm of breast: Secondary | ICD-10-CM

## 2022-02-15 DIAGNOSIS — F411 Generalized anxiety disorder: Secondary | ICD-10-CM | POA: Diagnosis present

## 2022-02-15 DIAGNOSIS — Z6841 Body Mass Index (BMI) 40.0 and over, adult: Secondary | ICD-10-CM | POA: Diagnosis not present

## 2022-02-15 DIAGNOSIS — Z888 Allergy status to other drugs, medicaments and biological substances status: Secondary | ICD-10-CM | POA: Diagnosis not present

## 2022-02-15 DIAGNOSIS — Z91013 Allergy to seafood: Secondary | ICD-10-CM

## 2022-02-15 DIAGNOSIS — Z8249 Family history of ischemic heart disease and other diseases of the circulatory system: Secondary | ICD-10-CM | POA: Diagnosis not present

## 2022-02-15 DIAGNOSIS — D472 Monoclonal gammopathy: Secondary | ICD-10-CM | POA: Diagnosis present

## 2022-02-15 LAB — COMPREHENSIVE METABOLIC PANEL
ALT: 10 U/L (ref 0–44)
AST: 17 U/L (ref 15–41)
Albumin: 4.1 g/dL (ref 3.5–5.0)
Alkaline Phosphatase: 68 U/L (ref 38–126)
Anion gap: 7 (ref 5–15)
BUN: 8 mg/dL (ref 6–20)
CO2: 30 mmol/L (ref 22–32)
Calcium: 9.3 mg/dL (ref 8.9–10.3)
Chloride: 100 mmol/L (ref 98–111)
Creatinine, Ser: 0.86 mg/dL (ref 0.44–1.00)
GFR, Estimated: >60 mL/min (ref >60)
Glucose, Bld: 131 mg/dL — ABNORMAL HIGH (ref 70–99)
Potassium: 3.4 mmol/L — ABNORMAL LOW (ref 3.5–5.1)
Sodium: 137 mmol/L (ref 135–145)
Total Bilirubin: 0.3 mg/dL (ref 0.3–1.2)
Total Protein: 8.1 g/dL (ref 6.5–8.1)

## 2022-02-15 LAB — CBC WITH DIFFERENTIAL/PLATELET
Abs Immature Granulocytes: 0.02 10*3/uL (ref 0.00–0.07)
Basophils Absolute: 0 10*3/uL (ref 0.0–0.1)
Basophils Relative: 0 %
Eosinophils Absolute: 0.3 10*3/uL (ref 0.0–0.5)
Eosinophils Relative: 3 %
HCT: 40.2 % (ref 36.0–46.0)
Hemoglobin: 13 g/dL (ref 12.0–15.0)
Immature Granulocytes: 0 %
Lymphocytes Relative: 18 %
Lymphs Abs: 1.4 10*3/uL (ref 0.7–4.0)
MCH: 28.2 pg (ref 26.0–34.0)
MCHC: 32.3 g/dL (ref 30.0–36.0)
MCV: 87.2 fL (ref 80.0–100.0)
Monocytes Absolute: 0.6 10*3/uL (ref 0.1–1.0)
Monocytes Relative: 7 %
Neutro Abs: 5.7 10*3/uL (ref 1.7–7.7)
Neutrophils Relative %: 72 %
Platelets: 265 10*3/uL (ref 150–400)
RBC: 4.61 MIL/uL (ref 3.87–5.11)
RDW: 13.7 % (ref 11.5–15.5)
WBC: 8.1 10*3/uL (ref 4.0–10.5)
nRBC: 0 % (ref 0.0–0.2)

## 2022-02-15 LAB — RESP PANEL BY RT-PCR (FLU A&B, COVID) ARPGX2
Influenza A by PCR: NEGATIVE
Influenza B by PCR: NEGATIVE
SARS Coronavirus 2 by RT PCR: NEGATIVE

## 2022-02-15 LAB — LACTIC ACID, PLASMA
Lactic Acid, Venous: 1 mmol/L (ref 0.5–1.9)
Lactic Acid, Venous: 0.9 mmol/L (ref 0.5–1.9)

## 2022-02-15 LAB — GROUP A STREP BY PCR: Group A Strep by PCR: DETECTED — AB

## 2022-02-15 MED ORDER — PROPRANOLOL HCL 10 MG PO TABS
60.0000 mg | ORAL_TABLET | Freq: Every day | ORAL | Status: DC
  Administered 2022-02-16: 60 mg via ORAL
  Filled 2022-02-15: qty 6

## 2022-02-15 MED ORDER — BUPROPION HCL ER (XL) 300 MG PO TB24
300.0000 mg | ORAL_TABLET | Freq: Every day | ORAL | Status: DC
  Administered 2022-02-16 – 2022-02-17 (×2): 300 mg via ORAL
  Filled 2022-02-15 (×2): qty 1

## 2022-02-15 MED ORDER — ACETAMINOPHEN 650 MG RE SUPP: 650.0000 mg | Freq: Four times a day (QID) | RECTAL | Status: DC | PRN

## 2022-02-15 MED ORDER — LAMOTRIGINE 25 MG PO TABS
150.0000 mg | ORAL_TABLET | Freq: Two times a day (BID) | ORAL | Status: DC
  Administered 2022-02-15 – 2022-02-17 (×4): 150 mg via ORAL
  Filled 2022-02-15 (×4): qty 2

## 2022-02-15 MED ORDER — ACETAMINOPHEN 500 MG PO TABS
1000.0000 mg | ORAL_TABLET | Freq: Three times a day (TID) | ORAL | Status: DC | PRN
  Administered 2022-02-15 – 2022-02-16 (×2): 1000 mg via ORAL
  Filled 2022-02-15 (×2): qty 2

## 2022-02-15 MED ORDER — PANTOPRAZOLE SODIUM 40 MG PO TBEC
40.0000 mg | DELAYED_RELEASE_TABLET | Freq: Every day | ORAL | Status: DC
  Administered 2022-02-15 – 2022-02-16 (×2): 40 mg via ORAL
  Filled 2022-02-15 (×2): qty 1

## 2022-02-15 MED ORDER — POTASSIUM CHLORIDE CRYS ER 20 MEQ PO TBCR
40.0000 meq | EXTENDED_RELEASE_TABLET | Freq: Once | ORAL | Status: AC
  Administered 2022-02-15: 40 meq via ORAL
  Filled 2022-02-15: qty 2

## 2022-02-15 MED ORDER — ACETAMINOPHEN 325 MG PO TABS: 650.0000 mg | ORAL_TABLET | Freq: Four times a day (QID) | ORAL | Status: DC | PRN

## 2022-02-15 MED ORDER — CEFAZOLIN SODIUM-DEXTROSE 2-4 GM/100ML-% IV SOLN
2.0000 g | Freq: Three times a day (TID) | INTRAVENOUS | Status: DC
  Administered 2022-02-15 – 2022-02-17 (×6): 2 g via INTRAVENOUS
  Filled 2022-02-15 (×8): qty 100

## 2022-02-15 MED ORDER — DULOXETINE HCL 60 MG PO CPEP
120.0000 mg | ORAL_CAPSULE | Freq: Every day | ORAL | Status: DC
  Administered 2022-02-16 – 2022-02-17 (×2): 120 mg via ORAL
  Filled 2022-02-15 (×2): qty 2

## 2022-02-15 MED ORDER — ENOXAPARIN SODIUM 40 MG/0.4ML IJ SOSY
40.0000 mg | PREFILLED_SYRINGE | INTRAMUSCULAR | Status: DC
  Administered 2022-02-15 – 2022-02-16 (×2): 40 mg via SUBCUTANEOUS
  Filled 2022-02-15 (×2): qty 0.4

## 2022-02-15 NOTE — ED Provider Notes (Signed)
?[place] EMERGENCY DEPT ?Provider Note ? ? ?CSN: [id number] ?Arrival date & time: 02/15/22  1317 ? ?  ? ?History ? ?No chief complaint on file. ? ? ?Kayla Mendoza is a 46 y.o. female with a past medical history of fibromyalgia, migraines, who presents today for evaluation of sore throat and worsening pain and redness on her left arm. ?She was seen 2 days ago, has taken 3 doses of doxycycline for cellulitis. ?During that time she has had significant spread of the erythema and edema in her left arm.  She has developed fevers up to 101. ?She denies difficulty swallowing however notes pain with swallowing. ? ? ? ?HPI ? ?  ? ?Home Medications ?Prior to Admission medications   ?Medication Sig Start Date End Date Taking? Authorizing Provider  ?ALPRAZolam (XANAX) 1 MG tablet Take 1 mg by mouth 3 (three) times daily as needed for anxiety.   Yes [provider]  ?ARIPiprazole (ABILIFY MAINTENA IM) Inject 1 Dose into the muscle every 30 (thirty) days.   Yes [provider]  ?buPROPion (WELLBUTRIN XL) 300 MG 24 hr tablet Take 300 mg by mouth daily.   Yes [provider]  ?doxycycline (VIBRAMYCIN) 100 MG capsule Take 1 capsule (100 mg total) by mouth 2 (two) times daily. ?Patient taking differently: Take 100 mg by mouth 2 (two) times daily. 10 day course 02/13/22  Yes Prosperi, Christian H, PA-C  ?DULoxetine (CYMBALTA) 60 MG capsule Take 120 mg by mouth daily.   Yes [provider]  ?lamoTRIgine (LAMICTAL) 150 MG tablet Take 150 mg by mouth 2 (two) times daily. 02/14/22  Yes [provider]  ?pantoprazole (PROTONIX) 40 MG tablet Take 40 mg by mouth at bedtime. 01/18/22  Yes [provider]  ?propranolol (INDERAL) 60 MG tablet Take 60 mg by mouth daily.    Yes [provider]  ?tizanidine (ZANAFLEX) 2 MG capsule Take 2 mg by mouth daily.    Yes [provider]  ?triamcinolone cream (KENALOG) 0.1 % Apply 1 application. topically 2 (two) times daily.  Apply thin layer twice daily to affected areas of eczema, do not use in any 1 area for greater than 2 weeks at a time without a break. 02/13/22  Yes Prosperi, Christian H, PA-C  ?valACYclovir (VALTREX) 1000 MG tablet Take 1,000 mg by mouth daily as needed (cold sores).   Yes [provider]  ?acetaminophen (TYLENOL) 325 MG tablet Take 650 mg by mouth as needed. ?Patient not taking: Reported on 02/15/2022 09/15/16   [provider]  ?   ? ?Allergies    ?Neurontin [gabapentin], Shellfish allergy, and Tramadol   ? ?Review of Systems   ?Review of Systems ?See above ?Physical Exam ?Updated Vital Signs ?BP 119/73 (BP Location: Right Arm)   Pulse 68   Temp (!) 97.4 ?F (36.3 ?C)   Resp 20   Ht '5\' 4"'$  (1.626 m)   Wt 113.4 kg   LMP 03/28/2014   SpO2 96%   BMI 42.91 kg/m?  ?Physical Exam ?Vitals and nursing note reviewed.  ?Constitutional:   ?   General: She is not in acute distress. ?   Appearance: She is not ill-appearing.  ?HENT:  ?   Head: Normocephalic and atraumatic.  ?   Mouth/Throat:  ?   Mouth: Mucous membranes are moist.  ?   Pharynx: Uvula midline. No uvula swelling.  ?   Tonsils: Tonsillar exudate present. No tonsillar abscesses. 2+ on the right. 2+ on the left.  ?  Comments: Normal phonation.  No trismus. ?Eyes:  ?   Conjunctiva/sclera: Conjunctivae normal.  ?Cardiovascular:  ?   Rate and Rhythm: Normal rate and regular rhythm.  ?Pulmonary:  ?   Effort: Pulmonary effort is normal. No respiratory distress.  ?Abdominal:  ?   General: There is no distension.  ?Musculoskeletal:  ?   Cervical back: Normal range of motion and neck supple.  ?   Comments: No obvious acute injury  ?Skin: ?   General: Skin is warm.  ?   Comments: Please see clinical images.  There is a large area of erythema and edema on the left antecubital area.  The erythema extends down towards the wrist and is nearly circumferential around the arm.  This area is painful and tender.  There is a small approximately 1 cm skin break.   There is generally indurated without palpable fluctuance.  ?Neurological:  ?   Mental Status: She is alert.  ?   Comments: Awake and alert, answers all questions appropriately.  Speech is not slurred.  ?Psychiatric:     ?   Mood and Affect: Mood normal.     ?   Behavior: Behavior normal.  ? ? ? ? ? ? ? ? ? ? ? ? ? ?ED Results / Procedures / Treatments   ?Labs ?(all labs ordered are listed, but only abnormal results are displayed) ?Labs Reviewed  ?GROUP A STREP BY PCR - Abnormal; Notable for the following components:  ?    Result Value  ? Group A Strep by PCR DETECTED (*)   ? All other components within normal limits  ?COMPREHENSIVE METABOLIC PANEL - Abnormal; Notable for the following components:  ? Potassium 3.4 (*)   ? Glucose, Bld 131 (*)   ? All other components within normal limits  ?RESP PANEL BY RT-PCR (FLU A&B, COVID) ARPGX2  ?CBC WITH DIFFERENTIAL/PLATELET  ?LACTIC ACID, PLASMA  ?LACTIC ACID, PLASMA  ?HIV ANTIBODY (ROUTINE TESTING W REFLEX)  ?CBC  ?BASIC METABOLIC PANEL  ?MAGNESIUM  ? ? ?EKG ?None ? ?Radiology ?No results found. ? ?Procedures ?Ultrasound ED Soft Tissue ? ?Date/Time: 02/15/2022 4:42 PM ?Performed by: Lorin Glass, PA-C ?Authorized by: Lorin Glass, PA-C  ? ?Procedure details:  ?  Indications: localization of abscess and evaluate for cellulitis   ?  Transverse view:  Visualized ?  Longitudinal view:  Visualized ?  Images: archived   ?  Limitations:  Body habitus ?Location:  ?  Location: upper extremity   ?  Side:  Left ?Findings:  ?   no abscess present ?   cellulitis present  ? ? ?Medications Ordered in ED ?Medications  ?ceFAZolin (ANCEF) IVPB 2g/100 mL premix (2 g Intravenous New Bag/Given 02/15/22 2200)  ?propranolol (INDERAL) tablet 60 mg (has no administration in time range)  ?buPROPion (WELLBUTRIN XL) 24 hr tablet 300 mg (has no administration in time range)  ?DULoxetine (CYMBALTA) DR capsule 120 mg (has no administration in time range)  ?pantoprazole (PROTONIX) EC tablet  40 mg (40 mg Oral Given 02/15/22 2201)  ?lamoTRIgine (LAMICTAL) tablet 150 mg (150 mg Oral Given 02/15/22 2201)  ?enoxaparin (LOVENOX) injection 40 mg (40 mg Subcutaneous Given 02/15/22 2200)  ?acetaminophen (TYLENOL) tablet 1,000 mg (1,000 mg Oral Given 02/15/22 2209)  ?potassium chloride SA (KLOR-CON M) CR tablet 40 mEq (40 mEq Oral Given 02/15/22 2321)  ? ? ?ED Course/ Medical Decision Making/ A&P ?  ?                        ?  Medical Decision Making ?Patient is a 46 year old woman who presents today for evaluation of 2 complaints. ?Her primary concern today is worsening erythema of the left forearm.  She has taken 3 doses of doxycycline and is continuing to have spread of the erythema and worsening pain and symptoms.  She has developed fevers up to 101 at home per her report. ?Ultrasound is obtained by myself at bedside showing cellulitis without a discrete drainable abscess collection. ?Given the significant spread she is experienced despite taking 3 doses of doxycycline it is nearly 48 hours and given the worsening I do not think that p.o. antibiotics alone will be sufficient control. ?Clinical images are obtained, RN outlined red area.  Labs are obtained and reviewed, please see below. ? ?Additionally patient reports sore throat.  Here her strep test is positive.  She does not have evidence of significant spread of infection including no trismus, uvula uvular deviation.  She has normal phonation.  No evidence of PTA/RPA. ? ?Patient will require transfer and admission. ?I spoke with hospitalist who will see patient for admission. ? ?Problems Addressed: ?Cellulitis of left upper extremity: complicated acute illness or injury ?   Details: Worsening.  She has taken 3 doses of doxycycline, however since initiation she has still had significant rapid spread of the erythema. ?Strep pharyngitis: complicated acute illness or injury ?   Details: Unclear if fever is caused by cellultitis, strep or both. ? ?Amount and/or Complexity  of Data Reviewed ?Independent Historian: parent ?External Data Reviewed: notes. ?   Details: Note from previoous ED visit ?Labs: ordered. ?   Details: Lactic acid is not elevated.  CMP with minimal hypokale

## 2022-02-15 NOTE — ED Triage Notes (Signed)
Pt was seen here 2 days ago for a cellulitis in her L arm. Today pt c/o HA, sore throat, fever. Pt is taking doxycyline.  ?

## 2022-02-15 NOTE — H&P (Addendum)
?History and Physical  ? ? ?Kayla Mendoza:[id number] DOB: 1976-01-02 DOA: 02/15/2022 ? ?PCP: Philmore Pali, NP ? ?Patient coming from: Home ? ?Chief Complaint: Left arm pain and redness ? ?HPI: Kayla Mendoza is a 46 y.o. female with medical history significant of monoclonal gammopathy, depression, anxiety, hiatal hernia, GERD, hypertension, migraine headaches, RLS, lumbar DDD, scoliosis, sleep apnea.  Seen in the ED 2 days ago for wound to the left antecubital area and noted to have eczema with overlying cellulitis.  Ultrasound revealed cobblestoning but no discrete fluid collection.  She was prescribed triamcinolone for eczema and doxycycline to cover for cellulitis.  She returns to the ED today complaining of worsening pain and redness of her left arm, fever, and sore throat.  In the ED, vital signs stable.  Labs showing no leukocytosis.  Lactic acid normal x2.  Potassium 3.4.  COVID and flu negative.  Group A strep PCR positive.  Bedside ultrasound did not reveal an abscess or fluid collection. Patient was given cefazolin. ? ?Patient reports noticing a skin lesion on her left antecubital area a few days ago.  Denies any insect bites or injury to this area.  She thinks it was a boil which popped and clear fluid came out.  The next day she noticed that the area was becoming red so she went to the emergency room and was diagnosed with cellulitis and prescribed doxycycline.  She has taken 3 doses of doxycycline since then but the infection is getting worse with redness spreading to her forearm.  Also 2 days ago she had a sore throat and temperature of 100 ?F at home.  She has no other complaints.  Denies cough, shortness of breath, chest pain, vomiting, abdominal pain, or diarrhea. ? ?Review of Systems:  ?Review of Systems  ?All other systems reviewed and are negative. ? ?Past Medical History:  ?Diagnosis Date  ? Abnormal uterine bleeding (AUB)   ? Anxiety   ? Arthritis of back   ? Bladder neoplasm   ? DDD  (degenerative disc disease), lumbar   ? Depression   ? Fibromyalgia   ? GERD (gastroesophageal reflux disease)   ? H/O hiatal hernia   ? Hypertension   ? Migraines   ? Nocturia   ? Pelvic pain in female   ? Pinched vertebral nerve   ? RLS (restless legs syndrome)   ? Scoliosis   ? Sleep apnea   ? Urgency of urination   ? ? ?Past Surgical History:  ?Procedure Laterality Date  ? ABDOMINAL HYSTERECTOMY    ? BREAST BIOPSY Right 09/04/2021  ? CYSTOSCOPY N/A 04/17/2014  ? Procedure: CYSTOSCOPY;  Surgeon: [name], MD;  Location: [facility];  Service: Gynecology;  Laterality: N/A;  clean/contaminated  ? CYSTOSCOPY WITH BIOPSY N/A 03/08/2014  ? Procedure: CYSTOSCOPY WITH  TURBT SMALL HYDROTENSION OF BLADDER INSTALLATION OF MARCAINE AND  PYRIDIUM;  Surgeon: [name], MD;  Location: [facility];  Service: Urology;  Laterality: N/A;  ? ESSURE TUBAL LIGATION  2010  ? EXCISION OF BACK LESION Right 02/22/2017  ? Procedure: EXCISION OF MASS RIGHT BACK;  Surgeon: [name], MD;  Location: [place];  Service: General;  Laterality: Right;  ? LAPAROSCOPIC ASSISTED VAGINAL HYSTERECTOMY Bilateral 04/17/2014  ? Procedure: LAPAROSCOPIC ASSISTED VAGINAL HYSTERECTOMY, Bilateral Salpingectomy;  Surgeon: [name], MD;  Location: [facility];  Service: Gynecology;  Laterality: Bilateral;  clean/contaminated  ? LIPOMA EXCISION    ? NEGATIVE SLEEP STUDY  2013  ? ? ?  reports that she has never smoked. She has never used smokeless tobacco. She reports current alcohol use. She reports that she does not use drugs. ? ?Allergies  ?Allergen Reactions  ? Neurontin [Gabapentin] Other (See Comments)  ?  Caused chest pain  ? Shellfish Allergy Hives  ? Tramadol Itching and Rash  ? ? ?Family History  ?Problem Relation Age of Onset  ? Diabetes Paternal Grandmother   ? Diabetes Paternal Grandfather   ? Hypertension Maternal Grandfather   ? Breast cancer Paternal Aunt   ? ? ?Prior to Admission medications    ?Medication Sig Start Date End Date Taking? Authorizing Provider  ?ALPRAZolam (XANAX) 1 MG tablet Take 1 mg by mouth 3 (three) times daily as needed for anxiety.   Yes [provider]  ?ARIPiprazole (ABILIFY MAINTENA IM) Inject 1 Dose into the muscle every 30 (thirty) days.   Yes [provider]  ?buPROPion (WELLBUTRIN XL) 300 MG 24 hr tablet Take 300 mg by mouth daily.   Yes [provider]  ?doxycycline (VIBRAMYCIN) 100 MG capsule Take 1 capsule (100 mg total) by mouth 2 (two) times daily. ?Patient taking differently: Take 100 mg by mouth 2 (two) times daily. 10 day course 02/13/22  Yes Prosperi, Christian H, PA-C  ?DULoxetine (CYMBALTA) 60 MG capsule Take 120 mg by mouth daily.   Yes [provider]  ?lamoTRIgine (LAMICTAL) 150 MG tablet Take 150 mg by mouth 2 (two) times daily. 02/14/22  Yes [provider]  ?pantoprazole (PROTONIX) 40 MG tablet Take 40 mg by mouth at bedtime. 01/18/22  Yes [provider]  ?propranolol (INDERAL) 60 MG tablet Take 60 mg by mouth daily.    Yes [provider]  ?tizanidine (ZANAFLEX) 2 MG capsule Take 2 mg by mouth daily.    Yes [provider]  ?triamcinolone cream (KENALOG) 0.1 % Apply 1 application. topically 2 (two) times daily. Apply thin layer twice daily to affected areas of eczema, do not use in any 1 area for greater than 2 weeks at a time without a break. 02/13/22  Yes Prosperi, Christian H, PA-C  ?valACYclovir (VALTREX) 1000 MG tablet Take 1,000 mg by mouth daily as needed (cold sores).   Yes [provider]  ?acetaminophen (TYLENOL) 325 MG tablet Take 650 mg by mouth as needed. ?Patient not taking: Reported on 02/15/2022 09/15/16   [provider]  ? ? ?Physical Exam: ?Vitals:  ? 02/15/22 1332 02/15/22 1335 02/15/22 1615 02/15/22 1939  ?BP: (!) 128/91  126/80 122/78  ?Pulse: 83  66 65  ?Resp: '20  19 18  '$ ?Temp: 99.9 ?F (37.7 ?C)   98.6 ?F (37 ?C)  ?TempSrc: Oral   Oral  ?SpO2: 96%  98%  98%  ?Weight:  113.4 kg    ?Height:  '5\' 4"'$  (1.626 m)    ? ? ?Physical Exam ?Vitals reviewed.  ?Constitutional:   ?   General: She is not in acute distress. ?HENT:  ?   Head: Normocephalic and atraumatic.  ?Eyes:  ?   Extraocular Movements: Extraocular movements intact.  ?Cardiovascular:  ?   Rate and Rhythm: Normal rate and regular rhythm.  ?   Pulses: Normal pulses.  ?Pulmonary:  ?   Effort: Pulmonary effort is normal. No respiratory distress.  ?   Breath sounds: Normal breath sounds.  ?Abdominal:  ?   General: Bowel sounds are normal. There is no distension.  ?   Palpations: Abdomen is soft.  ?   Tenderness: There is no  abdominal tenderness.  ?Musculoskeletal:     ?   General: No tenderness.  ?   Cervical back: Normal range of motion.  ?Skin: ?   General: Skin is warm and dry.  ?   Findings: Erythema present.  ?   Comments: Erythema extending from the left antecubital fossa to the arm and forearm.  No fluctuance or tenderness on palpation.  ?Neurological:  ?   General: No focal deficit present.  ?   Mental Status: She is alert and oriented to person, place, and time.  ?  ? ? ? ? ? ? ? ? ? ? ? ? ? ?Labs on Admission: I have personally reviewed following labs and imaging studies ? ?CBC: ?Recent Labs  ?Lab 02/15/22 ?1615  ?WBC 8.1  ?NEUTROABS 5.7  ?HGB 13.0  ?HCT 40.2  ?MCV 87.2  ?PLT 265  ? ?Basic Metabolic Panel: ?Recent Labs  ?Lab 02/15/22 ?1615  ?NA 137  ?K 3.4*  ?CL 100  ?CO2 30  ?GLUCOSE 131*  ?BUN 8  ?CREATININE 0.86  ?CALCIUM 9.3  ? ?GFR: ?Estimated Creatinine Clearance: 100.9 mL/min (by C-G formula based on SCr of 0.86 mg/dL). ?Liver Function Tests: ?Recent Labs  ?Lab 02/15/22 ?1615  ?AST 17  ?ALT 10  ?ALKPHOS 68  ?BILITOT 0.3  ?PROT 8.1  ?ALBUMIN 4.1  ? ?No results for input(s): LIPASE, AMYLASE in the last 168 hours. ?No results for input(s): AMMONIA in the last 168 hours. ?Coagulation Profile: ?No results for input(s): INR, PROTIME in the last 168 hours. ?Cardiac Enzymes: ?No results for input(s): CKTOTAL,  CKMB, CKMBINDEX, TROPONINI in the last 168 hours. ?BNP (last 3 results) ?No results for input(s): PROBNP in the last 8760 hours. ?HbA1C: ?No results for input(s): HGBA1C in the last 72 hours. ?CBG: ?No results for inpu

## 2022-02-16 DIAGNOSIS — L0390 Cellulitis, unspecified: Secondary | ICD-10-CM | POA: Diagnosis not present

## 2022-02-16 LAB — CBC
HCT: 37.5 % (ref 36.0–46.0)
Hemoglobin: 12.1 g/dL (ref 12.0–15.0)
MCH: 29.3 pg (ref 26.0–34.0)
MCHC: 32.3 g/dL (ref 30.0–36.0)
MCV: 90.8 fL (ref 80.0–100.0)
Platelets: 238 10*3/uL (ref 150–400)
RBC: 4.13 MIL/uL (ref 3.87–5.11)
RDW: 13.9 % (ref 11.5–15.5)
WBC: 6.7 10*3/uL (ref 4.0–10.5)
nRBC: 0 % (ref 0.0–0.2)

## 2022-02-16 LAB — BASIC METABOLIC PANEL
Anion gap: 7 (ref 5–15)
BUN: 8 mg/dL (ref 6–20)
CO2: 27 mmol/L (ref 22–32)
Calcium: 8.3 mg/dL — ABNORMAL LOW (ref 8.9–10.3)
Chloride: 105 mmol/L (ref 98–111)
Creatinine, Ser: 0.82 mg/dL (ref 0.44–1.00)
GFR, Estimated: >60 mL/min (ref >60)
Glucose, Bld: 99 mg/dL (ref 70–99)
Potassium: 3.5 mmol/L (ref 3.5–5.1)
Sodium: 139 mmol/L (ref 135–145)

## 2022-02-16 LAB — HIV ANTIBODY (ROUTINE TESTING W REFLEX): HIV Screen 4th Generation wRfx: NONREACTIVE

## 2022-02-16 LAB — MAGNESIUM: Magnesium: 2 mg/dL (ref 1.7–2.4)

## 2022-02-16 MED ORDER — POLYETHYLENE GLYCOL 3350 17 G PO PACK
17.0000 g | PACK | Freq: Every day | ORAL | Status: DC
  Administered 2022-02-16 – 2022-02-17 (×2): 17 g via ORAL
  Filled 2022-02-16 (×2): qty 1

## 2022-02-16 NOTE — Assessment & Plan Note (Signed)
On multiple medications at home which include Wellbutrin, Cymbalta and Lamictal. ?-Continue home meds ?

## 2022-02-16 NOTE — Assessment & Plan Note (Signed)
No sepsis.  Sore throat improving.  Afebrile this morning ?Group A strep PCR positive ?-Continue with IV cefazolin ?

## 2022-02-16 NOTE — Hospital Course (Addendum)
Taken from H&P. ? ?Kayla Mendoza is a 46 y.o. female with medical history significant of monoclonal gammopathy, depression, anxiety, hiatal hernia, GERD, hypertension, migraine headaches, RLS, lumbar DDD, scoliosis, sleep apnea.  Seen in the ED 2 days ago for wound to the left antecubital area and noted to have eczema with overlying cellulitis.  Per patient it started as a small boil, denies any insect bites or injuries. Ultrasound revealed cobblestoning but no discrete fluid collection.  She was prescribed triamcinolone for eczema and doxycycline to cover for cellulitis.   ?She returns to the ED today complaining of worsening pain and redness of her left arm, fever, and sore throat.  In the ED, vital signs stable.  Labs showing no leukocytosis.  Lactic acid normal x2.  Potassium 3.4.  COVID and flu negative.  Group A strep PCR positive.  Bedside ultrasound did not reveal an abscess or fluid collection. Patient was given cefazolin. ? ?5/2: Sore throat seems improving, continue to have significant pain, erythema and edema involving left antecubital area and surrounding arm and forearm. ?See pictures in H&P ?

## 2022-02-16 NOTE — Progress Notes (Signed)
?Progress Note ? ? ?Patient: Kayla Mendoza BMW:[id number] DOB: 12-01-1975 DOA: 02/15/2022     1 ?DOS: the patient was seen and examined on 02/16/2022 ?  ?Brief hospital course: ?Taken from H&P. ? ?Kayla Mendoza is a 46 y.o. female with medical history significant of monoclonal gammopathy, depression, anxiety, hiatal hernia, GERD, hypertension, migraine headaches, RLS, lumbar DDD, scoliosis, sleep apnea.  Seen in the ED 2 days ago for wound to the left antecubital area and noted to have eczema with overlying cellulitis.  Per patient it started as a small boil, denies any insect bites or injuries. Ultrasound revealed cobblestoning but no discrete fluid collection.  She was prescribed triamcinolone for eczema and doxycycline to cover for cellulitis.   ?She returns to the ED today complaining of worsening pain and redness of her left arm, fever, and sore throat.  In the ED, vital signs stable.  Labs showing no leukocytosis.  Lactic acid normal x2.  Potassium 3.4.  COVID and flu negative.  Group A strep PCR positive.  Bedside ultrasound did not reveal an abscess or fluid collection. Patient was given cefazolin. ? ?5/2: Sore throat seems improving, continue to have significant pain, erythema and edema involving left antecubital area and surrounding arm and forearm. ?See pictures in H&P ? ? ?Assessment and Plan: ?* Cellulitis ?Failed outpatient antibiotic therapy.  No leukocytosis or lactic acidosis.  No signs of sepsis. Bedside ultrasound done in the ED did not reveal an abscess or fluid collection.  Pain seems little improving ?-Continue IV cefazolin and monitor closely ?-Continue with pain management and supportive care ? ?Strep pharyngitis ?No sepsis.  Sore throat improving.  Afebrile this morning ?Group A strep PCR positive ?-Continue with IV cefazolin ? ?Generalized anxiety disorder ?On multiple medications at home which include Wellbutrin, Cymbalta and Lamictal. ?-Continue home meds ? ?GERD ?- Continue with  Protonix ? ?Hypokalemia ?Resolved with repletion.  Magnesium within normal limit. ?-Continue to monitor and replete as needed ? ? ?Subjective: Patient continued to have left arm pain and swelling.  Sore throat improving. ? ?Physical Exam: ?Vitals:  ? 02/16/22 0105 02/16/22 0559 02/16/22 0929 02/16/22 1328  ?BP: 121/74 110/64 114/65 111/65  ?Pulse: (!) 57 (!) 59 (!) 57 (!) 57  ?Resp: '18 20 16 14  '$ ?Temp: (!) 97.5 ?F (36.4 ?C) (!) 97.4 ?F (36.3 ?C) 98.1 ?F (36.7 ?C) 98.1 ?F (36.7 ?C)  ?TempSrc: Oral  Oral Oral  ?SpO2: 97% 97% 100% 96%  ?Weight:      ?Height:      ? ?General.     In no acute distress. ?Pulmonary.  Lungs clear bilaterally, normal respiratory effort. ?CV.  Regular rate and rhythm, no JVD, rub or murmur. ?Abdomen.  Soft, nontender, nondistended, BS positive. ?CNS.  Alert and oriented x3.  No focal neurologic deficit. ?Extremities.  No LE edema, no cyanosis, pulses intact and symmetrical.  Left upper extremity edema and erythema involving left antecubital and surrounding area. ?Psychiatry.  Judgment and insight appears normal. ? ?Data Reviewed: ?Prior notes, labs and images reviewed ? ?Family Communication: Discussed with patient ? ?Disposition: ?Status is: Inpatient ?Remains inpatient appropriate because: Severity of illness ? ? Planned Discharge Destination: Home ? ?DVT prophylaxis.  Lovenox ?Time spent: 45 minutes ? ?This record has been created using Systems analyst. Errors have been sought and corrected,but may not always be located. Such creation errors do not reflect on the standard of care. ? ?Author: ?Kayla Nimrod, MD ?02/16/2022 4:19 PM ? ?For on call review  [email or web address]  ?

## 2022-02-16 NOTE — Progress Notes (Signed)
?  Transition of Care (TOC) Screening Note ? ? ?Patient Details  ?Name: Kayla Mendoza ?Date of Birth: 01-Jul-1976 ? ? ?Transition of Care (TOC) CM/SW Contact:    ?Calab Sachse, LCSW ?Phone Number: ?02/16/2022, 11:02 AM ? ? ? ?Transition of Care Department [facility]) has reviewed patient and no TOC needs have been identified at this time. We will continue to monitor patient advancement through interdisciplinary progression rounds. If new patient transition needs arise, please place a TOC consult. ? ? ?

## 2022-02-16 NOTE — Assessment & Plan Note (Signed)
Resolved with repletion.  Magnesium within normal limit. ?-Continue to monitor and replete as needed ?

## 2022-02-16 NOTE — Assessment & Plan Note (Signed)
Failed outpatient antibiotic therapy.  No leukocytosis or lactic acidosis.  No signs of sepsis. Bedside ultrasound done in the ED did not reveal an abscess or fluid collection.  Pain seems little improving ?-Continue IV cefazolin and monitor closely ?-Continue with pain management and supportive care ?

## 2022-02-16 NOTE — Assessment & Plan Note (Signed)
-   Continue with Protonix 

## 2022-02-17 DIAGNOSIS — J020 Streptococcal pharyngitis: Secondary | ICD-10-CM

## 2022-02-17 DIAGNOSIS — L03114 Cellulitis of left upper limb: Secondary | ICD-10-CM

## 2022-02-17 DIAGNOSIS — F411 Generalized anxiety disorder: Secondary | ICD-10-CM

## 2022-02-17 MED ORDER — CEFADROXIL 500 MG PO CAPS
500.0000 mg | ORAL_CAPSULE | Freq: Two times a day (BID) | ORAL | 0 refills | Status: AC
Start: 2022-02-17 — End: 2022-02-21

## 2022-02-17 NOTE — Assessment & Plan Note (Signed)
On multiple medications at home which include Wellbutrin, Cymbalta and Lamictal. ?-Continue home meds ?

## 2022-02-17 NOTE — Assessment & Plan Note (Signed)
Resolved with repletion.  Magnesium within normal limit. ?-Continue to monitor and replete as needed ?

## 2022-02-17 NOTE — Progress Notes (Signed)
Discharge instructions given to patient and all questions were answered.  

## 2022-02-17 NOTE — Discharge Summary (Signed)
?Physician Discharge Summary ?  ?Patient: Kayla Mendoza MRN: [id number] DOB: [birth year]  ?Admit date:     02/15/2022  ?Discharge date: 02/17/22  ?Discharge Physician: Marylu Lund  ? ?PCP: Philmore Pali, NP  ? ?Recommendations at discharge:  ? ? Follow up with PCP in 1-2 weeks ? ?Discharge Diagnoses: ?Principal Problem: ?  Cellulitis ?Active Problems: ?  Strep pharyngitis ?  Generalized anxiety disorder ?  GERD ?  Hypokalemia ? ?Resolved Problems: ?  * No resolved hospital problems. * ? ?Hospital Course: ?Taken from H&P. ? ?Kayla Mendoza is a 46 y.o. female with medical history significant of monoclonal gammopathy, depression, anxiety, hiatal hernia, GERD, hypertension, migraine headaches, RLS, lumbar DDD, scoliosis, sleep apnea.  Seen in the ED 2 days ago for wound to the left antecubital area and noted to have eczema with overlying cellulitis.  Per patient it started as a small boil, denies any insect bites or injuries. Ultrasound revealed cobblestoning but no discrete fluid collection.  She was prescribed triamcinolone for eczema and doxycycline to cover for cellulitis.   ?She returns to the ED today complaining of worsening pain and redness of her left arm, fever, and sore throat.  In the ED, vital signs stable.  Labs showing no leukocytosis.  Lactic acid normal x2.  Potassium 3.4.  COVID and flu negative.  Group A strep PCR positive.  Bedside ultrasound did not reveal an abscess or fluid collection. Patient was given cefazolin. ? ?5/2: Sore throat seems improving, continue to have significant pain, erythema and edema involving left antecubital area and surrounding arm and forearm. ?See pictures in H&P ? ?Assessment and Plan: ?* Cellulitis ?Failed outpatient antibiotic therapy.  No leukocytosis or lactic acidosis.  No signs of sepsis. Bedside ultrasound done in the ED did not reveal an abscess or fluid collection.  ?-Continued IV cefazolin with much improvement ?-Pt to complete course of duracef on d/c ? ?Strep  pharyngitis ?No sepsis.  Sore throat improving.  Afebrile this morning ?Group A strep PCR positive ?-given IV ancef ?-to complete course of duracef on d/c ? ?Generalized anxiety disorder ?On multiple medications at home which include Wellbutrin, Cymbalta and Lamictal. ?-Continue home meds ? ?GERD ?- Continue with Protonix ? ?Hypokalemia ?Resolved with repletion.  Magnesium within normal limit. ?-Continue to monitor and replete as needed ? ? ? ?  ? ? ?Consultants:  ?Procedures performed:   ?Disposition: Home ?Diet recommendation:  ?Regular diet ?DISCHARGE MEDICATION: ?Allergies as of 02/17/2022   ? ?   Reactions  ? Neurontin [gabapentin] Other (See Comments)  ? Caused chest pain  ? Shellfish Allergy Hives  ? Tramadol Itching, Rash  ? ?  ? ?  ?Medication List  ?  ? ?STOP taking these medications   ? ?doxycycline 100 MG capsule ?Commonly known as: VIBRAMYCIN ?  ? ?  ? ?TAKE these medications   ? ?ABILIFY MAINTENA IM ?Inject 1 Dose into the muscle every 30 (thirty) days. ?  ?acetaminophen 325 MG tablet ?Commonly known as: TYLENOL ?Take 650 mg by mouth as needed. ?  ?ALPRAZolam 1 MG tablet ?Commonly known as: Duanne Moron ?Take 1 mg by mouth 3 (three) times daily as needed for anxiety. ?  ?buPROPion 300 MG 24 hr tablet ?Commonly known as: WELLBUTRIN XL ?Take 300 mg by mouth daily. ?  ?cefadroxil 500 MG capsule ?Commonly known as: DURICEF ?Take 1 capsule (500 mg total) by mouth 2 (two) times daily for 4 days. ?  ?DULoxetine 60 MG capsule ?Commonly known as: CYMBALTA ?  Take 120 mg by mouth daily. ?  ?lamoTRIgine 150 MG tablet ?Commonly known as: LAMICTAL ?Take 150 mg by mouth 2 (two) times daily. ?  ?pantoprazole 40 MG tablet ?Commonly known as: PROTONIX ?Take 40 mg by mouth at bedtime. ?  ?propranolol 60 MG tablet ?Commonly known as: INDERAL ?Take 60 mg by mouth daily. ?  ?tizanidine 2 MG capsule ?Commonly known as: ZANAFLEX ?Take 2 mg by mouth daily. ?  ?triamcinolone cream 0.1 % ?Commonly known as: KENALOG ?Apply 1 application.  topically 2 (two) times daily. Apply thin layer twice daily to affected areas of eczema, do not use in any 1 area for greater than 2 weeks at a time without a break. ?  ?valACYclovir 1000 MG tablet ?Commonly known as: VALTREX ?Take 1,000 mg by mouth daily as needed (cold sores). ?  ? ?  ? ? ?Discharge Exam: ?Danley Danker Weights  ? 02/15/22 1335  ?Weight: 113.4 kg  ? ?General exam: Awake, laying in bed, in nad ?Respiratory system: Normal respiratory effort, no wheezing ?Cardiovascular system: regular rate, s1, s2 ?Gastrointestinal system: Soft, nondistended, positive BS ?Central nervous system: CN2-12 grossly intact, strength intact ?Extremities: Perfused, no clubbing ?Skin: Normal skin turgor, no notable skin lesions seen ?Psychiatry: Mood normal // no visual hallucinations  ? ?Condition at discharge: fair ? ?The results of significant diagnostics from this hospitalization (including imaging, microbiology, ancillary and laboratory) are listed below for reference.  ? ?Imaging Studies: ?No results found. ? ?Microbiology: ?Results for orders placed or performed during the hospital encounter of 02/15/22  ?Group A Strep by PCR     Status: Abnormal  ? Collection Time: 02/15/22  1:45 PM  ? Specimen: Throat; Sterile Swab  ?Result Value Ref Range Status  ? Group A Strep by PCR DETECTED (A) NOT DETECTED Final  ?  Comment: Performed at [facility], [address], [place], [place] [address]  ?Resp Panel by RT-PCR (Flu A&B, Covid) Nasopharyngeal Swab     Status: None  ? Collection Time: 02/15/22  1:45 PM  ? Specimen: Nasopharyngeal Swab; Nasopharyngeal(NP) swabs in vial transport medium  ?Result Value Ref Range Status  ? SARS Coronavirus 2 by RT PCR NEGATIVE NEGATIVE Final  ?  Comment: (NOTE) ?SARS-CoV-2 target nucleic acids are NOT DETECTED. ? ?The SARS-CoV-2 RNA is generally detectable in upper respiratory ?specimens during the acute phase of infection. The lowest ?concentration of SARS-CoV-2 viral copies this  assay can detect is ?138 copies/mL. A negative result does not preclude SARS-Cov-2 ?infection and should not be used as the sole basis for treatment or ?other patient management decisions. A negative result may occur with  ?improper specimen collection/handling, submission of specimen other ?than nasopharyngeal swab, presence of viral mutation(s) within the ?areas targeted by this assay, and inadequate number of viral ?copies(<138 copies/mL). A negative result must be combined with ?clinical observations, patient history, and epidemiological ?information. The expected result is Negative. ? ?Fact Sheet for Patients:  ?EntrepreneurPulse.com.au ? ?Fact Sheet for Healthcare Providers:  ?IncredibleEmployment.be ? ?This test is no t yet approved or cleared by the [place] FDA and  ?has been authorized for detection and/or diagnosis of SARS-CoV-2 by ?FDA under an Emergency Use Authorization (EUA). This EUA will remain  ?in effect (meaning this test can be used) for the duration of the ?COVID-19 declaration under Section 564(b)(1) of the Act, 21 ?U.S.C.section 360bbb-3(b)(1), unless the authorization is terminated  ?or revoked sooner.  ? ? ?  ? Influenza A by PCR NEGATIVE NEGATIVE Final  ?  Influenza B by PCR NEGATIVE NEGATIVE Final  ?  Comment: (NOTE) ?The Xpert Xpress SARS-CoV-2/FLU/RSV plus assay is intended as an aid ?in the diagnosis of influenza from Nasopharyngeal swab specimens and ?should not be used as a sole basis for treatment. Nasal washings and ?aspirates are unacceptable for Xpert Xpress SARS-CoV-2/FLU/RSV ?testing. ? ?Fact Sheet for Patients: ?EntrepreneurPulse.com.au ? ?Fact Sheet for Healthcare Providers: ?IncredibleEmployment.be ? ?This test is not yet approved or cleared by the [place] FDA and ?has been authorized for detection and/or diagnosis of SARS-CoV-2 by ?FDA under an Emergency Use Authorization (EUA). This EUA will  remain ?in effect (meaning this test can be used) for the duration of the ?COVID-19 declaration under Section 564(b)(1) of the Act, 21 U.S.C. ?section 360bbb-3(b)(1), unless the authorization is terminated or

## 2022-02-17 NOTE — Progress Notes (Signed)
Verbal order given from MD to hold patients propranolol due to patients soft blood pressure.  ?

## 2022-02-17 NOTE — Assessment & Plan Note (Signed)
Failed outpatient antibiotic therapy.  No leukocytosis or lactic acidosis.  No signs of sepsis. Bedside ultrasound done in the ED did not reveal an abscess or fluid collection.  ?-Continued IV cefazolin with much improvement ?-Pt to complete course of duracef on d/c ?

## 2022-02-17 NOTE — Assessment & Plan Note (Signed)
-   Continue with Protonix 

## 2022-02-17 NOTE — Assessment & Plan Note (Signed)
No sepsis.  Sore throat improving.  Afebrile this morning ?Group A strep PCR positive ?-given IV ancef ?-to complete course of duracef on d/c ?

## 2022-04-10 ENCOUNTER — Ambulatory Visit (HOSPITAL_COMMUNITY)
Admission: EM | Admit: 2022-04-10 | Discharge: 2022-04-10 | Disposition: A | Discharge status: Home / Self Care | Payer: Medicaid Other | Attending: Internal Medicine | Admitting: Internal Medicine

## 2022-04-10 ENCOUNTER — Encounter (HOSPITAL_COMMUNITY): Payer: Self-pay | Admitting: Emergency Medicine

## 2022-04-10 DIAGNOSIS — R300 Dysuria: Secondary | ICD-10-CM | POA: Diagnosis present

## 2022-04-10 DIAGNOSIS — N898 Other specified noninflammatory disorders of vagina: Secondary | ICD-10-CM

## 2022-04-10 LAB — POCT URINALYSIS DIPSTICK, ED / UC
Bilirubin Urine: NEGATIVE
Glucose, UA: NEGATIVE mg/dL
Ketones, ur: NEGATIVE mg/dL
Leukocytes,Ua: NEGATIVE
Nitrite: NEGATIVE
Protein, ur: 100 mg/dL — AB
Specific Gravity, Urine: >=1.03 (ref 1.005–1.030)
Urobilinogen, UA: 1 mg/dL (ref 0.0–1.0)
pH: 6 (ref 5.0–8.0)

## 2022-04-10 NOTE — ED Triage Notes (Signed)
Dysuria that comes on near the end of her stream starting Wednesday, worsening yesterday. States she began to notice a small amount of blood in her urine. Denies abdominal pain, hx of smoking

## 2022-04-12 LAB — URINE CULTURE

## 2022-04-12 LAB — CERVICOVAGINAL ANCILLARY ONLY
Bacterial Vaginitis (gardnerella): POSITIVE — AB
Candida Glabrata: POSITIVE — AB
Candida Vaginitis: NEGATIVE
Chlamydia: NEGATIVE
Comment: NEGATIVE
Comment: NEGATIVE
Comment: NEGATIVE
Comment: NEGATIVE
Comment: NEGATIVE
Comment: NORMAL
Neisseria Gonorrhea: NEGATIVE
Trichomonas: NEGATIVE

## 2022-04-13 ENCOUNTER — Telehealth (HOSPITAL_COMMUNITY): Payer: Self-pay | Admitting: Emergency Medicine

## 2022-04-13 MED ORDER — FLUCONAZOLE 150 MG PO TABS: 150.0000 mg | ORAL_TABLET | Freq: Once | ORAL | 0 refills | Status: AC

## 2022-04-13 MED ORDER — METRONIDAZOLE 500 MG PO TABS: 500.0000 mg | ORAL_TABLET | Freq: Two times a day (BID) | ORAL | 0 refills | Status: DC

## 2022-05-01 ENCOUNTER — Emergency Department (HOSPITAL_BASED_OUTPATIENT_CLINIC_OR_DEPARTMENT_OTHER)
Admission: EM | Admit: 2022-05-01 | Discharge: 2022-05-01 | Disposition: A | Discharge status: Home / Self Care | Payer: Medicaid Other | Attending: Emergency Medicine | Admitting: Emergency Medicine

## 2022-05-01 ENCOUNTER — Other Ambulatory Visit: Payer: Self-pay

## 2022-05-01 ENCOUNTER — Emergency Department (HOSPITAL_BASED_OUTPATIENT_CLINIC_OR_DEPARTMENT_OTHER): Payer: Medicaid Other | Admitting: Radiology

## 2022-05-01 ENCOUNTER — Encounter (HOSPITAL_BASED_OUTPATIENT_CLINIC_OR_DEPARTMENT_OTHER): Payer: Self-pay | Admitting: Emergency Medicine

## 2022-05-01 DIAGNOSIS — M79671 Pain in right foot: Secondary | ICD-10-CM | POA: Diagnosis present

## 2022-05-01 DIAGNOSIS — M7731 Calcaneal spur, right foot: Secondary | ICD-10-CM | POA: Insufficient documentation

## 2022-05-01 MED ORDER — NAPROXEN 250 MG PO TABS
500.0000 mg | ORAL_TABLET | Freq: Once | ORAL | Status: AC
Start: 2022-05-01 — End: 2022-05-01
  Administered 2022-05-01: 500 mg via ORAL
  Filled 2022-05-01: qty 2

## 2022-05-01 NOTE — ED Provider Notes (Signed)
[facility] EMERGENCY DEPT Provider Note   CSN: [id number] Arrival date & time: 05/01/22  1009     History  Chief Complaint  Patient presents with   Foot Pain    Kayla Mendoza is a 46 y.o. female.  Kayla Mendoza is a 46 y.o. female who presents to the ED for evaluation of pain in her heel and foot.  Patient reports pain in her heel started about a month ago and was more mild but over the past week pain has started to move from her heel along the lateral aspect of her foot and up into her ankle.  She has noticed occasional swelling in the foot, but no redness or skin changes.  Denies any specific injury but spends most of the day on her feet at work.  No other aggravating or alleviating factors.  The history is provided by the patient.  Foot Pain       Home Medications Prior to Admission medications   Medication Sig Start Date End Date Taking? Authorizing Provider  acetaminophen (TYLENOL) 325 MG tablet Take 650 mg by mouth as needed. Patient not taking: Reported on 02/15/2022 09/15/16   [provider]  ALPRAZolam Duanne Moron) 1 MG tablet Take 1 mg by mouth 3 (three) times daily as needed for anxiety.    [provider]  ARIPiprazole (ABILIFY MAINTENA IM) Inject 1 Dose into the muscle every 30 (thirty) days.    [provider]  buPROPion (WELLBUTRIN XL) 300 MG 24 hr tablet Take 300 mg by mouth daily.    [provider]  DULoxetine (CYMBALTA) 60 MG capsule Take 120 mg by mouth daily.    [provider]  lamoTRIgine (LAMICTAL) 150 MG tablet Take 150 mg by mouth 2 (two) times daily. 02/14/22   [provider]  metroNIDAZOLE (FLAGYL) 500 MG tablet Take 1 tablet (500 mg total) by mouth 2 (two) times daily. 04/13/22   Chase Picket, MD  pantoprazole (PROTONIX) 40 MG tablet Take 40 mg by mouth at bedtime. 01/18/22   [provider]  propranolol (INDERAL) 60 MG tablet Take 60 mg by mouth daily.     [provider]  tizanidine (ZANAFLEX) 2 MG capsule Take 2 mg by mouth daily.     [provider]  triamcinolone cream (KENALOG) 0.1 % Apply 1 application. topically 2 (two) times daily. Apply thin layer twice daily to affected areas of eczema, do not use in any 1 area for greater than 2 weeks at a time without a break. 02/13/22   Prosperi, Christian H, PA-C  valACYclovir (VALTREX) 1000 MG tablet Take 1,000 mg by mouth daily as needed (cold sores).    [provider]      Allergies    Neurontin [gabapentin], Shellfish allergy, and Tramadol    Review of Systems   Review of Systems  Constitutional:  Negative for chills and fever.  Musculoskeletal:  Positive for arthralgias.  Skin:  Negative for color change.    Physical Exam Updated Vital Signs BP 133/85 (BP Location: Right Arm)   Pulse 73   Temp 98 F (36.7 C) (Tympanic)   Resp 16   LMP 03/28/2014   SpO2 98%  Physical Exam Vitals and nursing note reviewed.  Constitutional:      General: She is not in acute distress.    Appearance: Normal appearance. She is well-developed. She is not ill-appearing or diaphoretic.  HENT:     Head: Normocephalic and atraumatic.  Eyes:  General:        Right eye: No discharge.        Left eye: No discharge.  Pulmonary:     Effort: Pulmonary effort is normal. No respiratory distress.  Musculoskeletal:     Comments: Tenderness over the heel pad of the right foot without wounds or overlying erythema or palpable deformity, there is some very mild soft tissue swelling noted, normal range of motion and sensation, 2+ DP and PT pulses.  No swelling or bony tenderness at the ankle.  Neurological:     Mental Status: She is alert and oriented to person, place, and time.     Coordination: Coordination normal.  Psychiatric:        Mood and Affect: Mood normal.        Behavior: Behavior normal.     ED Results / Procedures / Treatments   Labs (all labs ordered are listed, but only  abnormal results are displayed) Labs Reviewed - No data to display  EKG None  Radiology DG Ankle Complete Right  Result Date: 05/01/2022 CLINICAL DATA:  Acute RIGHT ankle pain for 2 weeks. No known injury. Initial encounter. EXAM: RIGHT ANKLE - COMPLETE 3+ VIEW COMPARISON:  05/14/2018 FINDINGS: There is no evidence of acute fracture, subluxation or dislocation. The joint spaces are unremarkable. No focal bony lesions are present. A calcaneal plantar spur is noted. IMPRESSION: 1. No evidence of acute abnormality. 2. Calcaneal plantar spur. Electronically Signed   By: Margarette Canada M.D.   On: 05/01/2022 11:26   DG Foot Complete Right  Result Date: 05/01/2022 CLINICAL DATA:  RIGHT foot pain for 2 weeks. No known injury. Initial encounter. EXAM: RIGHT FOOT COMPLETE - 3+ VIEW COMPARISON:  None Available. FINDINGS: There is no evidence of acute fracture, subluxation or dislocation. The Lisfranc joints are unremarkable. A small calcaneal plantar spur is noted. Apparent dorsal soft tissue swelling is noted. IMPRESSION: 1. Apparent dorsal soft tissue swelling. No acute bony abnormality. 2. Small calcaneal plantar spur. Electronically Signed   By: Margarette Canada M.D.   On: 05/01/2022 11:23    Procedures Procedures    Medications Ordered in ED Medications  naproxen (NAPROSYN) tablet 500 mg (has no administration in time range)    ED Course/ Medical Decision Making/ A&P                           Medical Decision Making Amount and/or Complexity of Data Reviewed Radiology: ordered.   Patient presents with right foot and ankle pain starting at the heel.  No inciting injury but patient spends most of the day on her feet at work.  The foot is neurovascularly intact without any wounds or significant swelling, no overlying erythema or signs of infection.  X-rays of the right foot and ankle ordered, I have viewed and interpreted imaging myself, patient with a calcaneal plantar spur that I suspect is the  cause of her pain, no evidence of fracture or other deformity.  Discussed heel spur with patient, recommend NSAIDs, heel cushion support in her shoes and podiatry follow-up.  At this time there does not appear to be any evidence of an acute emergency medical condition requiring further emergent evaluation and the patient appears stable for discharge with appropriate outpatient follow up. Diagnosis and return precautions discussed with patient who verbalizes understanding and is agreeable to discharge.           Final Clinical Impression(s) / ED Diagnoses Final diagnoses:  Heel spur, right    Rx / DC Orders ED Discharge Orders     None         Janet Berlin 05/01/22 1232    Gareth Morgan, MD 05/01/22 2321

## 2022-05-01 NOTE — Discharge Instructions (Addendum)
You have a heel spur on your x-ray that is likely the cause of your pain especially with you being on your feet throughout the day at work.  Take NSAIDs as prescribed twice daily, you can use Tylenol 1000 mg every 6 hours as needed for breakthrough pain.  Buying a heel insert to put in your shoe can also be helpful to provide more cushion and support.  Please call to schedule follow-up appointment with podiatry for further treatment of heel spur.

## 2022-05-01 NOTE — ED Triage Notes (Signed)
Pt states she has had right foot pain for about a week, has progressed from heel to right ankle and last 2 toes, sharp pain.

## 2022-05-10 ENCOUNTER — Ambulatory Visit (INDEPENDENT_AMBULATORY_CARE_PROVIDER_SITE_OTHER): Payer: Medicaid Other | Admitting: Podiatry

## 2022-05-10 ENCOUNTER — Ambulatory Visit: Payer: Medicaid Other

## 2022-05-10 ENCOUNTER — Encounter: Payer: Self-pay | Admitting: Podiatry

## 2022-05-10 DIAGNOSIS — R1012 Left upper quadrant pain: Secondary | ICD-10-CM | POA: Insufficient documentation

## 2022-05-10 DIAGNOSIS — G43A0 Cyclical vomiting, not intractable: Secondary | ICD-10-CM | POA: Insufficient documentation

## 2022-05-10 DIAGNOSIS — M722 Plantar fascial fibromatosis: Secondary | ICD-10-CM

## 2022-05-10 DIAGNOSIS — R1313 Dysphagia, pharyngeal phase: Secondary | ICD-10-CM | POA: Insufficient documentation

## 2022-05-10 DIAGNOSIS — K222 Esophageal obstruction: Secondary | ICD-10-CM | POA: Insufficient documentation

## 2022-05-10 DIAGNOSIS — N760 Acute vaginitis: Secondary | ICD-10-CM | POA: Insufficient documentation

## 2022-05-10 DIAGNOSIS — K5901 Slow transit constipation: Secondary | ICD-10-CM | POA: Insufficient documentation

## 2022-05-10 DIAGNOSIS — N939 Abnormal uterine and vaginal bleeding, unspecified: Secondary | ICD-10-CM | POA: Insufficient documentation

## 2022-05-10 DIAGNOSIS — K2100 Gastro-esophageal reflux disease with esophagitis, without bleeding: Secondary | ICD-10-CM | POA: Insufficient documentation

## 2022-05-10 DIAGNOSIS — R319 Hematuria, unspecified: Secondary | ICD-10-CM | POA: Insufficient documentation

## 2022-05-10 DIAGNOSIS — N200 Calculus of kidney: Secondary | ICD-10-CM | POA: Insufficient documentation

## 2022-05-10 MED ORDER — MELOXICAM 15 MG PO TABS: 15.0000 mg | ORAL_TABLET | Freq: Every day | ORAL | 3 refills | Status: DC

## 2022-05-10 MED ORDER — METHYLPREDNISOLONE 4 MG PO TBPK: ORAL_TABLET | ORAL | 0 refills | Status: DC

## 2022-05-10 MED ORDER — TRIAMCINOLONE ACETONIDE 40 MG/ML IJ SUSP
20.0000 mg | Freq: Once | INTRAMUSCULAR | Status: AC
  Administered 2022-05-10: 20 mg

## 2022-05-10 NOTE — Progress Notes (Signed)
Subjective:  Patient ID: Kayla Mendoza, female    DOB: 23-Nov-1975,  MRN: [id number] HPI Chief Complaint  Patient presents with   Foot Pain    Plantar heel and lateral side right - aching x few months, AM pain, got really bad 2 weeks ago, went to [facility], said heel spur, been taking Tylenol/Motrin   New Patient (Initial Visit)    Est pt 2019    46 y.o. female presents with the above complaint.   ROS: Denies fever chills nausea vomiting muscle aches pains calf pain back pain chest pain shortness of breath.  Past Medical History:  Diagnosis Date   Abnormal uterine bleeding (AUB)    Anxiety    Arthritis of back    Bladder neoplasm    DDD (degenerative disc disease), lumbar    Depression    Fibromyalgia    GERD (gastroesophageal reflux disease)    H/O hiatal hernia    Hypertension    Migraines    Nocturia    Pelvic pain in female    Pinched vertebral nerve    RLS (restless legs syndrome)    Scoliosis    Sleep apnea    Urgency of urination    Past Surgical History:  Procedure Laterality Date   ABDOMINAL HYSTERECTOMY     BREAST BIOPSY Right 09/04/2021   CYSTOSCOPY N/A 04/17/2014   Procedure: CYSTOSCOPY;  Surgeon: [name], MD;  Location: [facility];  Service: Gynecology;  Laterality: N/A;  clean/contaminated   CYSTOSCOPY WITH BIOPSY N/A 03/08/2014   Procedure: CYSTOSCOPY WITH  TURBT SMALL HYDROTENSION OF BLADDER INSTALLATION OF MARCAINE AND  PYRIDIUM;  Surgeon: [name], MD;  Location: [facility];  Service: Urology;  Laterality: N/A;   ESSURE TUBAL LIGATION  2010   EXCISION OF BACK LESION Right 02/22/2017   Procedure: EXCISION OF MASS RIGHT BACK;  Surgeon: [name], MD;  Location: [place];  Service: General;  Laterality: Right;   LAPAROSCOPIC ASSISTED VAGINAL HYSTERECTOMY Bilateral 04/17/2014   Procedure: LAPAROSCOPIC ASSISTED VAGINAL HYSTERECTOMY, Bilateral Salpingectomy;  Surgeon: [name], MD;  Location: [facility];   Service: Gynecology;  Laterality: Bilateral;  clean/contaminated   LIPOMA EXCISION     NEGATIVE SLEEP STUDY  2013    Current Outpatient Medications:    meloxicam (MOBIC) 15 MG tablet, Take 1 tablet (15 mg total) by mouth daily., Disp: 30 tablet, Rfl: 3   methylPREDNISolone (MEDROL DOSEPAK) 4 MG TBPK tablet, 6 day dose pack - take as directed, Disp: 21 tablet, Rfl: 0   acetaminophen (TYLENOL) 325 MG tablet, Take 650 mg by mouth as needed. (Patient not taking: Reported on 02/15/2022), Disp: , Rfl:    ALPRAZolam (XANAX) 1 MG tablet, Take 1 mg by mouth 3 (three) times daily as needed for anxiety., Disp: , Rfl:    ARIPiprazole (ABILIFY MAINTENA IM), Inject 1 Dose into the muscle every 30 (thirty) days., Disp: , Rfl:    buPROPion (WELLBUTRIN XL) 300 MG 24 hr tablet, Take 300 mg by mouth daily., Disp: , Rfl:    DULoxetine (CYMBALTA) 60 MG capsule, Take 120 mg by mouth daily., Disp: , Rfl:    lamoTRIgine (LAMICTAL) 150 MG tablet, Take 150 mg by mouth 2 (two) times daily., Disp: , Rfl:    metroNIDAZOLE (FLAGYL) 500 MG tablet, Take 1 tablet (500 mg total) by mouth 2 (two) times daily., Disp: 14 tablet, Rfl: 0   oxybutynin (DITROPAN-XL) 5 MG 24 hr tablet, Take 5 mg by mouth daily., Disp: , Rfl:  pantoprazole (PROTONIX) 40 MG tablet, Take 40 mg by mouth at bedtime., Disp: , Rfl:    phenazopyridine (PYRIDIUM) 200 MG tablet, Take by mouth., Disp: , Rfl:    propranolol (INDERAL) 60 MG tablet, Take 60 mg by mouth daily. , Disp: , Rfl:    tizanidine (ZANAFLEX) 2 MG capsule, Take 2 mg by mouth daily. , Disp: , Rfl:    triamcinolone cream (KENALOG) 0.1 %, Apply 1 application. topically 2 (two) times daily. Apply thin layer twice daily to affected areas of eczema, do not use in any 1 area for greater than 2 weeks at a time without a break., Disp: 80 g, Rfl: 1   valACYclovir (VALTREX) 1000 MG tablet, Take 1,000 mg by mouth daily as needed (cold sores)., Disp: , Rfl:   Allergies  Allergen Reactions   Neurontin  [Gabapentin] Other (See Comments)    Caused chest pain   Shellfish Allergy Hives   Tramadol Itching and Rash   Review of Systems Objective:  There were no vitals filed for this visit.  General: Well developed, nourished, in no acute distress, alert and oriented x3   Dermatological: Skin is warm, dry and supple bilateral. Nails x 10 are well maintained; remaining integument appears unremarkable at this time. There are no open sores, no preulcerative lesions, no rash or signs of infection present.  Vascular: Dorsalis Pedis artery and Posterior Tibial artery pedal pulses are 2/4 bilateral with immedate capillary fill time. Pedal hair growth present. No varicosities and no lower extremity edema present bilateral.   Neruologic: Grossly intact via light touch bilateral. Vibratory intact via tuning fork bilateral. Protective threshold with Semmes Wienstein monofilament intact to all pedal sites bilateral. Patellar and Achilles deep tendon reflexes 2+ bilateral. No Babinski or clonus noted bilateral.   Musculoskeletal: No gross boney pedal deformities bilateral. No pain, crepitus, or limitation noted with foot and ankle range of motion bilateral. Muscular strength 5/5 in all groups tested bilateral.  Pain on palpation medial calcaneal tubercle and lateral fourth fifth tarsometatarsal joints.  No pain on medial and lateral compression of the calcaneus.  Gait: Unassisted, Nonantalgic.    Radiographs:  Radiographs repeat evaluated today from previously taken in the ED demonstrated plantar distally oriented calcaneal heel spur.  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  Plan: Planter fasciitis with lateral compensatory syndrome injected medial calcaneal tubercle area at the plantar fascial Caney insertion site 20 mg Kenalog 5 mg Marcaine.  Start her on a Medrol Dosepak to be followed by meloxicam.  We discussed appropriate shoe gear stretching  exercise ice therapy and shoe gear modifications.     Javon Hupfer T. [place], [place]

## 2022-06-10 ENCOUNTER — Ambulatory Visit: Payer: Medicaid Other | Admitting: Podiatry

## 2022-08-05 ENCOUNTER — Other Ambulatory Visit: Payer: Self-pay | Admitting: Urology

## 2022-08-31 ENCOUNTER — Encounter (HOSPITAL_BASED_OUTPATIENT_CLINIC_OR_DEPARTMENT_OTHER): Payer: Self-pay | Admitting: Urology

## 2022-08-31 NOTE — Progress Notes (Signed)
Spoke w/ via phone for pre-op interview--- [place]----   EKG            Lab results------ COVID test -----patient states asymptomatic no test needed Arrive at -------0645 NPO after MN NO Solid Food.   Med rec completed Medications to take morning of surgery ----- Inderal, Xanax PRN, Wellbutrin, Cymbalta, Lamictal. Diabetic medication ----- Patient instructed no nail polish to be worn day of surgery Patient instructed to bring photo id and insurance card day of surgery Patient aware to have Driver (ride ) / caregiver  Mother Jeanella Cara  for 24 hours after surgery  Patient Special Instructions ----- Pre-Op special Istructions ----- Patient verbalized understanding of instructions that were given at this phone interview. Patient denies shortness of breath, chest pain, fever, cough at this phone interview.

## 2022-09-03 ENCOUNTER — Ambulatory Visit (HOSPITAL_BASED_OUTPATIENT_CLINIC_OR_DEPARTMENT_OTHER): Payer: Medicaid Other | Admitting: Certified Registered Nurse Anesthetist

## 2022-09-03 ENCOUNTER — Other Ambulatory Visit: Payer: Self-pay

## 2022-09-03 ENCOUNTER — Encounter (HOSPITAL_BASED_OUTPATIENT_CLINIC_OR_DEPARTMENT_OTHER)
Admission: RE | Disposition: A | Discharge status: Home / Self Care | Payer: Self-pay | Source: Home / Self Care | Attending: Urology

## 2022-09-03 ENCOUNTER — Ambulatory Visit (HOSPITAL_BASED_OUTPATIENT_CLINIC_OR_DEPARTMENT_OTHER)
Admission: RE | Admit: 2022-09-03 | Discharge: 2022-09-03 | Disposition: A | Discharge status: Home / Self Care | Payer: Medicaid Other | Attending: Urology | Admitting: Urology

## 2022-09-03 ENCOUNTER — Encounter (HOSPITAL_BASED_OUTPATIENT_CLINIC_OR_DEPARTMENT_OTHER): Payer: Self-pay | Admitting: Urology

## 2022-09-03 DIAGNOSIS — Z8616 Personal history of COVID-19: Secondary | ICD-10-CM | POA: Diagnosis not present

## 2022-09-03 DIAGNOSIS — I10 Essential (primary) hypertension: Secondary | ICD-10-CM | POA: Diagnosis not present

## 2022-09-03 DIAGNOSIS — Z8719 Personal history of other diseases of the digestive system: Secondary | ICD-10-CM | POA: Insufficient documentation

## 2022-09-03 DIAGNOSIS — N3946 Mixed incontinence: Secondary | ICD-10-CM

## 2022-09-03 DIAGNOSIS — F418 Other specified anxiety disorders: Secondary | ICD-10-CM

## 2022-09-03 DIAGNOSIS — N3281 Overactive bladder: Secondary | ICD-10-CM

## 2022-09-03 DIAGNOSIS — G4730 Sleep apnea, unspecified: Secondary | ICD-10-CM | POA: Insufficient documentation

## 2022-09-03 DIAGNOSIS — K219 Gastro-esophageal reflux disease without esophagitis: Secondary | ICD-10-CM | POA: Insufficient documentation

## 2022-09-03 HISTORY — PX: BOTOX INJECTION: SHX5754

## 2022-09-03 SURGERY — BOTOX INJECTION
Anesthesia: General | Site: Bladder

## 2022-09-03 MED ORDER — LIDOCAINE HCL (PF) 2 % IJ SOLN
INTRAMUSCULAR | Status: AC
  Filled 2022-09-03: qty 5

## 2022-09-03 MED ORDER — DEXAMETHASONE SODIUM PHOSPHATE 10 MG/ML IJ SOLN
INTRAMUSCULAR | Status: AC
  Filled 2022-09-03: qty 1

## 2022-09-03 MED ORDER — CEFAZOLIN SODIUM-DEXTROSE 2-4 GM/100ML-% IV SOLN
2.0000 g | INTRAVENOUS | Status: AC
  Administered 2022-09-03: 2 g via INTRAVENOUS

## 2022-09-03 MED ORDER — ONDANSETRON HCL 4 MG/2ML IJ SOLN
INTRAMUSCULAR | Status: AC
  Filled 2022-09-03: qty 2

## 2022-09-03 MED ORDER — CEFAZOLIN SODIUM-DEXTROSE 2-4 GM/100ML-% IV SOLN
INTRAVENOUS | Status: AC
  Filled 2022-09-03: qty 100

## 2022-09-03 MED ORDER — LACTATED RINGERS IV SOLN: INTRAVENOUS | Status: DC

## 2022-09-03 MED ORDER — KETOROLAC TROMETHAMINE 30 MG/ML IJ SOLN
INTRAMUSCULAR | Status: DC | PRN
  Administered 2022-09-03: 30 mg via INTRAVENOUS

## 2022-09-03 MED ORDER — MIDAZOLAM HCL 5 MG/5ML IJ SOLN
INTRAMUSCULAR | Status: DC | PRN
  Administered 2022-09-03: 2 mg via INTRAVENOUS

## 2022-09-03 MED ORDER — SODIUM CHLORIDE (PF) 0.9 % IJ SOLN
INTRAMUSCULAR | Status: DC | PRN
  Administered 2022-09-03: 10 mL

## 2022-09-03 MED ORDER — FENTANYL CITRATE (PF) 100 MCG/2ML IJ SOLN
INTRAMUSCULAR | Status: AC
  Filled 2022-09-03: qty 2

## 2022-09-03 MED ORDER — PROPOFOL 10 MG/ML IV BOLUS
INTRAVENOUS | Status: DC | PRN
  Administered 2022-09-03: 170 mg via INTRAVENOUS

## 2022-09-03 MED ORDER — ACETAMINOPHEN 325 MG PO TABS: 325.0000 mg | ORAL_TABLET | ORAL | Status: DC | PRN

## 2022-09-03 MED ORDER — ACETAMINOPHEN 160 MG/5ML PO SOLN: 325.0000 mg | ORAL | Status: DC | PRN

## 2022-09-03 MED ORDER — OXYCODONE HCL 5 MG/5ML PO SOLN: 5.0000 mg | Freq: Once | ORAL | Status: AC | PRN

## 2022-09-03 MED ORDER — MIDAZOLAM HCL 2 MG/2ML IJ SOLN
INTRAMUSCULAR | Status: AC
  Filled 2022-09-03: qty 2

## 2022-09-03 MED ORDER — STERILE WATER FOR IRRIGATION IR SOLN
Status: DC | PRN
  Administered 2022-09-03: 3000 mL

## 2022-09-03 MED ORDER — OXYCODONE HCL 5 MG PO TABS
5.0000 mg | ORAL_TABLET | Freq: Once | ORAL | Status: AC | PRN
  Administered 2022-09-03: 5 mg via ORAL

## 2022-09-03 MED ORDER — GLYCOPYRROLATE 0.2 MG/ML IJ SOLN
INTRAMUSCULAR | Status: DC | PRN
  Administered 2022-09-03: .2 mg via INTRAVENOUS

## 2022-09-03 MED ORDER — ONABOTULINUMTOXINA 100 UNITS IJ SOLR
INTRAMUSCULAR | Status: DC | PRN
  Administered 2022-09-03: 100 [IU]

## 2022-09-03 MED ORDER — FENTANYL CITRATE (PF) 100 MCG/2ML IJ SOLN
25.0000 ug | INTRAMUSCULAR | Status: DC | PRN
  Administered 2022-09-03: 25 ug via INTRAVENOUS

## 2022-09-03 MED ORDER — ACETAMINOPHEN 10 MG/ML IV SOLN: 1000.0000 mg | Freq: Once | INTRAVENOUS | Status: DC | PRN

## 2022-09-03 MED ORDER — FENTANYL CITRATE (PF) 100 MCG/2ML IJ SOLN
INTRAMUSCULAR | Status: DC | PRN
  Administered 2022-09-03: 50 ug via INTRAVENOUS

## 2022-09-03 MED ORDER — OXYCODONE HCL 5 MG PO TABS
ORAL_TABLET | ORAL | Status: AC
  Filled 2022-09-03: qty 1

## 2022-09-03 MED ORDER — AMISULPRIDE (ANTIEMETIC) 5 MG/2ML IV SOLN: 10.0000 mg | Freq: Once | INTRAVENOUS | Status: DC | PRN

## 2022-09-03 MED ORDER — PROMETHAZINE HCL 25 MG/ML IJ SOLN: 6.2500 mg | INTRAMUSCULAR | Status: DC | PRN

## 2022-09-03 MED ORDER — ONDANSETRON HCL 4 MG/2ML IJ SOLN
INTRAMUSCULAR | Status: DC | PRN
  Administered 2022-09-03: 4 mg via INTRAVENOUS

## 2022-09-03 MED ORDER — DEXAMETHASONE SODIUM PHOSPHATE 4 MG/ML IJ SOLN
INTRAMUSCULAR | Status: DC | PRN
  Administered 2022-09-03: 5 mg via INTRAVENOUS

## 2022-09-03 MED ORDER — LIDOCAINE HCL (CARDIAC) PF 100 MG/5ML IV SOSY
PREFILLED_SYRINGE | INTRAVENOUS | Status: DC | PRN
  Administered 2022-09-03: 50 mg via INTRAVENOUS

## 2022-09-03 MED ORDER — EPHEDRINE SULFATE (PRESSORS) 50 MG/ML IJ SOLN
INTRAMUSCULAR | Status: DC | PRN
  Administered 2022-09-03: 10 mg via INTRAVENOUS

## 2022-09-03 MED ORDER — SCOPOLAMINE 1 MG/3DAYS TD PT72: 1.0000 | MEDICATED_PATCH | TRANSDERMAL | Status: DC

## 2022-09-03 MED ORDER — CEPHALEXIN 500 MG PO CAPS: 500.0000 mg | ORAL_CAPSULE | Freq: Every day | ORAL | 0 refills | Status: DC

## 2022-09-03 MED ORDER — PROPOFOL 10 MG/ML IV BOLUS
INTRAVENOUS | Status: AC
  Filled 2022-09-03: qty 20

## 2022-09-03 SURGICAL SUPPLY — 17 items
BAG DRAIN URO-CYSTO SKYTR STRL (DRAIN) ×1 IMPLANT
BAG DRN UROCATH (DRAIN) ×1
CLOTH BEACON ORANGE TIMEOUT ST (SAFETY) ×1 IMPLANT
ELECT REM PT RETURN 9FT ADLT (ELECTROSURGICAL) ×1
ELECTRODE REM PT RTRN 9FT ADLT (ELECTROSURGICAL) ×1 IMPLANT
GLOVE BIO SURGEON STRL SZ7.5 (GLOVE) ×1 IMPLANT
GLOVE BIO SURGEON STRL SZ8 (GLOVE) IMPLANT
GOWN STRL REUS W/TWL LRG LVL3 (GOWN DISPOSABLE) ×1 IMPLANT
KIT TURNOVER CYSTO (KITS) ×1 IMPLANT
MANIFOLD NEPTUNE II (INSTRUMENTS) IMPLANT
NDL ASPIRATION 22 (NEEDLE) IMPLANT
NEEDLE ASPIRATION 22 (NEEDLE) IMPLANT
NEEDLE HYPO 22GX1.5 SAFETY (NEEDLE) IMPLANT
NS IRRIG 500ML POUR BTL (IV SOLUTION) IMPLANT
PACK CYSTO (CUSTOM PROCEDURE TRAY) ×1 IMPLANT
TUBE CONNECTING 12X1/4 (SUCTIONS) IMPLANT
WATER STERILE IRR 3000ML UROMA (IV SOLUTION) ×1 IMPLANT

## 2022-09-03 NOTE — Op Note (Signed)
Preoperative diagnosis: Overactive bladder, mixed incontinence Postoperative diagnosis: Same  Procedure: Intravesical Botox injection - 100U   Surgeon: Junious Silk  Anesthesia: General  Indication for procedure: Kayla Mendoza is a 46 year old female with frequency urgency and mixed incontinence.  Overactive bladder.  She failed multiple medical therapy.  Findings: On exam the vulva appeared normal.  Meatus appeared normal.  On bimanual the bladder and the urethra were palpably normal.  On cystoscopy she had a mild meatal stricture which was dilated with the rigid cystoscope sheath.  Bladder was unremarkable.  No mucosal lesions.  No stone or foreign bodies.  Trigone and ureteral orifice ease appeared normal.  Description of procedure: After consent was obtained patient brought to the operating room.  After adequate anesthesia she was placed in lithotomy position and prepped and draped in the usual sterile fashion.  Timeout was performed to confirm the patient and procedure.  The injection scope initially would not pass.  I took the 12 French rigid scope sheath with the obturator and gently passed that per urethra to dilate it.  The injection scope was then passed.  There was some mild meatal stricture that appeared to be dilated.  The remainder of the urethra was unremarkable.  The bladder was inspected emptied and then refilled with clear irrigant to about 200 cc.  Botox 100 units in 10 mL was injected in 0.5 mL increments x20 in the usual grid fashion.  The bladder was emptied and refilled.  Hemostasis was excellent.  The scope was removed.  Exam under anesthesia performed.  She was then awakened taken the cover room in stable condition.  Complications: None  Blood loss: Minimal   Specimens: None  Drains: None  Disposition: Patient stable to PACU

## 2022-09-03 NOTE — Anesthesia Procedure Notes (Signed)
Procedure Name: LMA Insertion Date/Time: 09/03/2022 9:07 AM  Performed by: Bufford Spikes, CRNAPre-anesthesia Checklist: Patient identified, Emergency Drugs available, Suction available and Patient being monitored Patient Re-evaluated:Patient Re-evaluated prior to induction Oxygen Delivery Method: Circle system utilized Preoxygenation: Pre-oxygenation with 100% oxygen Induction Type: IV induction Ventilation: Mask ventilation without difficulty LMA: LMA inserted LMA Size: 4.0 Number of attempts: 1 Placement Confirmation: positive ETCO2 Tube secured with: Tape Dental Injury: Teeth and Oropharynx as per pre-operative assessment

## 2022-09-03 NOTE — Discharge Instructions (Addendum)
Post Anesthesia Home Care Instructions  Activity: Get plenty of rest for the remainder of the day. A responsible individual must stay with you for 24 hours following the procedure.  For the next 24 hours, DO NOT: -Drive a car -Paediatric nurse -Drink alcoholic beverages -Take any medication unless instructed by your physician -Make any legal decisions or sign important papers.  Meals: Start with liquid foods such as gelatin or soup. Progress to regular foods as tolerated. Avoid greasy, spicy, heavy foods. If nausea and/or vomiting occur, drink only clear liquids until the nausea and/or vomiting subsides. Call your physician if vomiting continues.  Special Instructions/Symptoms: Your throat may feel dry or sore from the anesthesia or the breathing tube placed in your throat during surgery. If this causes discomfort, gargle with warm salt water. The discomfort should disappear within 24 hours.  If you had a scopolamine patch placed behind your ear for the management of post- operative nausea and/or vomiting:  1. The medication in the patch is effective for 72 hours, after which it should be removed.  Wrap patch in a tissue and discard in the trash. Wash hands thoroughly with soap and water. 2. You may remove the patch earlier than 72 hours if you experience unpleasant side effects which may include dry mouth, dizziness or visual disturbances. 3. Avoid touching the patch. Wash your hands with soap and water after contact with the patch.    Post Anesthesia Home Care Instructions  Activity: Get plenty of rest for the remainder of the day. A responsible individual must stay with you for 24 hours following the procedure.  For the next 24 hours, DO NOT: -Drive a car -Paediatric nurse -Drink alcoholic beverages -Take any medication unless instructed by your physician -Make any legal decisions or sign important papers.  Meals: Start with liquid foods such as gelatin or soup. Progress to  regular foods as tolerated. Avoid greasy, spicy, heavy foods. If nausea and/or vomiting occur, drink only clear liquids until the nausea and/or vomiting subsides. Call your physician if vomiting continues.  Special Instructions/Symptoms: Your throat may feel dry or sore from the anesthesia or the breathing tube placed in your throat during surgery. If this causes discomfort, gargle with warm salt water. The discomfort should disappear within 24 hours.  If you had a scopolamine patch placed behind your ear for the management of post- operative nausea and/or vomiting:  1. The medication in the patch is effective for 72 hours, after which it should be removed.  Wrap patch in a tissue and discard in the trash. Wash hands thoroughly with soap and water. 2. You may remove the patch earlier than 72 hours if you experience unpleasant side effects which may include dry mouth, dizziness or visual disturbances. 3. Avoid touching the patch. Wash your hands with soap and water after contact with the patch.   Botulinum Toxin Bladder Injection  A botulinum toxin bladder injection is a procedure to treat an overactive bladder and incontinence (leakage of urine). During the procedure, a drug called botulinum toxin is injected into the bladder through a long, thin needle. This drug relaxes the bladder muscles and reduces overactivity. What can I expect after the procedure?  After your procedure, it is common to have: Blood-tinged urine. Burning or soreness when you pass urine.  Follow these instructions at home: Medicines Take over-the-counter and prescription medicines only as told by your health care provider. If you were prescribed an antibiotic medicine, take it as told by your health care  provider.  General instructions  If you were given a sedative during the procedure, it can affect you for several hours. Do not drive or operate machinery until your health care provider says that it is safe. Drink  enough fluid to keep your urine pale yellow. Return to your normal activities as told by your health care provider. Ask your health care provider what activities are safe for you. Keep all follow-up visits. Contact a health care provider if you have: A fever or chills. Blood-tinged urine for more than one day after your procedure. Worsening pain or burning when you pass urine. Pain or burning when passing urine for more than two days after your procedure. Trouble emptying your bladder. Get help right away if you: Have bright red blood in your urine. Are unable to pass urine  Summary A botulinum toxin bladder injection is a procedure to treat an overactive bladder. This is generally a safe procedure. However, problems may occur, including not being able to pass urine, bleeding, infection, pain, and an allergic reaction to the botulinum toxin. After the procedure, it is common to have blood in your urine and to have soreness or burning when passing urine. Contact a health care provider if you have a fever, blood in your urine for more than a few days, or trouble passing urine. Get help right away if you have bright red blood in your urine, or if you are unable to pass urine. This information is not intended to replace advice given to you by your health care provider. Make sure you discuss any questions you have with your health care provider. Document Revised: 04/10/2021 Document Reviewed: 04/10/2021 Elsevier Patient Education  [place].   Toradol, a non-steroidal medication (NSAID) was received at approximately 9:34 a.m., may take additional NSAIDs (e.g. motrin, ibuprofen, naproxen) after 3:30p.m. if needed

## 2022-09-03 NOTE — H&P (Signed)
H&P  Chief Complaint: Mixed incontinence, overactive bladder  History of Present Illness: Kayla Mendoza is a 46 year old female with a history of mixed incontinence and a frequency and urgency consistent with overactive bladder.  Urodynamics revealed a lower capacity hypersensitive bladder.  She has failed multiple medications.  She presents today for intravesical Botox injection.  She has been well without dysuria or gross hematuria.  No fever.  She had COVID earlier in the month but now no congestion or cough.  Past Medical History:  Diagnosis Date   Abnormal uterine bleeding (AUB)    Anxiety    Arthritis of back    Bladder neoplasm    DDD (degenerative disc disease), lumbar    Depression    Fibromyalgia    GERD (gastroesophageal reflux disease)    H/O hiatal hernia    Hypertension    Migraines    Nocturia    Pelvic pain in female    Pinched vertebral nerve    RLS (restless legs syndrome)    Scoliosis    Sleep apnea    Urgency of urination    Past Surgical History:  Procedure Laterality Date   ABDOMINAL HYSTERECTOMY     BREAST BIOPSY Right 09/04/2021   CYSTOSCOPY N/A 04/17/2014   Procedure: CYSTOSCOPY;  Surgeon: [name], MD;  Location: [facility];  Service: Gynecology;  Laterality: N/A;  clean/contaminated   CYSTOSCOPY WITH BIOPSY N/A 03/08/2014   Procedure: CYSTOSCOPY WITH  TURBT SMALL HYDROTENSION OF BLADDER INSTALLATION OF MARCAINE AND  PYRIDIUM;  Surgeon: [name], MD;  Location: [facility];  Service: Urology;  Laterality: N/A;   ESSURE TUBAL LIGATION  2010   EXCISION OF BACK LESION Right 02/22/2017   Procedure: EXCISION OF MASS RIGHT BACK;  Surgeon: [name], MD;  Location: [facility];  Service: General;  Laterality: Right;   LAPAROSCOPIC ASSISTED VAGINAL HYSTERECTOMY Bilateral 04/17/2014   Procedure: LAPAROSCOPIC ASSISTED VAGINAL HYSTERECTOMY, Bilateral Salpingectomy;  Surgeon: [name], MD;  Location: [facility];  Service:  Gynecology;  Laterality: Bilateral;  clean/contaminated   LIPOMA EXCISION     NEGATIVE SLEEP STUDY  2013    Home Medications:  Medications Prior to Admission  Medication Sig Dispense Refill Last Dose   ALPRAZolam (XANAX) 1 MG tablet Take 1 mg by mouth 3 (three) times daily as needed for anxiety.   09/02/2022   ARIPiprazole (ABILIFY MAINTENA IM) Inject 1 Dose into the muscle every 30 (thirty) days.   Past Month   buPROPion (WELLBUTRIN XL) 300 MG 24 hr tablet Take 300 mg by mouth daily.   Past Week   DULoxetine (CYMBALTA) 60 MG capsule Take 120 mg by mouth daily.   09/02/2022   lamoTRIgine (LAMICTAL) 150 MG tablet Take 150 mg by mouth 2 (two) times daily.   09/02/2022   meloxicam (MOBIC) 15 MG tablet Take 1 tablet (15 mg total) by mouth daily. 30 tablet 3 08/31/2022   oxybutynin (DITROPAN-XL) 5 MG 24 hr tablet Take 5 mg by mouth daily.   09/02/2022   pantoprazole (PROTONIX) 40 MG tablet Take 40 mg by mouth at bedtime.   08/30/2022   propranolol (INDERAL) 60 MG tablet Take 60 mg by mouth daily.    09/03/2022 at 0600   tizanidine (ZANAFLEX) 2 MG capsule Take 2 mg by mouth daily.    09/02/2022   triamcinolone cream (KENALOG) 0.1 % Apply 1 application. topically 2 (two) times daily. Apply thin layer twice daily to affected areas of eczema, do not use in any 1  area for greater than 2 weeks at a time without a break. 80 g 1 Past Month   valACYclovir (VALTREX) 1000 MG tablet Take 1,000 mg by mouth daily as needed (cold sores).   08/31/2022   acetaminophen (TYLENOL) 325 MG tablet Take 650 mg by mouth as needed. (Patient not taking: Reported on 02/15/2022)      methylPREDNISolone (MEDROL DOSEPAK) 4 MG TBPK tablet 6 day dose pack - take as directed 21 tablet 0    metroNIDAZOLE (FLAGYL) 500 MG tablet Take 1 tablet (500 mg total) by mouth 2 (two) times daily. 14 tablet 0    phenazopyridine (PYRIDIUM) 200 MG tablet Take by mouth.      Allergies:  Allergies  Allergen Reactions   Neurontin [Gabapentin]  Other (See Comments)    Caused chest pain   Shellfish Allergy Hives   Tramadol Itching and Rash    Family History  Problem Relation Age of Onset   Diabetes Paternal Grandmother    Diabetes Paternal Grandfather    Hypertension Maternal Grandfather    Breast cancer Paternal Aunt    Social History:  reports that she has never smoked. She has never used smokeless tobacco. She reports current alcohol use. She reports that she does not use drugs.  ROS: A complete review of systems was performed.  All systems are negative except for pertinent findings as noted. Review of Systems  All other systems reviewed and are negative.    Physical Exam:  Vital signs in last 24 hours: Temp:  [98.5 F (36.9 C)] 98.5 F (36.9 C) (11/17 0707) Pulse Rate:  [61] 61 (11/17 0707) Resp:  [16] 16 (11/17 0707) BP: (138)/(84) 138/84 (11/17 0707) SpO2:  [97 %] 97 % (11/17 0707) Weight:  [119.3 kg] 119.3 kg (11/17 0707) General:  Alert and oriented, No acute distress HEENT: Normocephalic, atraumatic Cardiovascular: Regular rate and rhythm Lungs: Regular rate and effort Abdomen: Soft, nontender, nondistended, no abdominal masses Back: No CVA tenderness Extremities: No edema Neurologic: Grossly intact  Laboratory Data:  No results found for this or any previous visit (from the past 24 hour(s)). No results found for this or any previous visit (from the past 240 hour(s)). Creatinine: No results for input(s): "CREATININE" in the last 168 hours.  Impression/Assessment:  Mixed incontinence, overactive bladder-  Plan:  I discussed with the patient and her mom the nature, potential benefits, risks and alternatives to intravesical injection of botox, including side effects of the proposed treatment, the likelihood of the patient achieving the goals of the procedure, and any potential problems that might occur during the procedure or recuperation. All questions answered. Patient elects to proceed.     [name] 09/03/2022, 7:26 AM

## 2022-09-03 NOTE — Transfer of Care (Signed)
Immediate Anesthesia Transfer of Care Note  Patient: Kayla Mendoza  Procedure(s) Performed: CYSTOSCOPY BOTOX INJECTION (Bladder)  Patient Location: PACU  Anesthesia Type:General  Level of Consciousness: awake, alert , and oriented  Airway & Oxygen Therapy: Patient Spontanous Breathing and Patient connected to nasal cannula oxygen  Post-op Assessment: Report given to RN and Post -op Vital signs reviewed and stable  Post vital signs: Reviewed and stable  Last Vitals:  Vitals Value Taken Time  BP    Temp    Pulse 53 09/03/22 0942  Resp 13 09/03/22 0942  SpO2 100 % 09/03/22 0942  Vitals shown include unvalidated device data.  Last Pain:  Vitals:   09/03/22 0707  TempSrc: Oral  PainSc: 0-No pain      Patients Stated Pain Goal: 4 (69/79/48 0165)  Complications: No notable events documented.

## 2022-09-03 NOTE — Anesthesia Preprocedure Evaluation (Addendum)
Anesthesia Evaluation  Patient identified by MRN, date of birth, ID band Patient awake    Reviewed: Allergy & Precautions, NPO status , Patient's Chart, lab work & pertinent test results  Airway Mallampati: III  TM Distance: >3 FB Neck ROM: Full    Dental  (+) Teeth Intact   Pulmonary sleep apnea    breath sounds clear to auscultation       Cardiovascular hypertension,  Rhythm:Regular Rate:Normal     Neuro/Psych  Headaches PSYCHIATRIC DISORDERS Anxiety Depression     Neuromuscular disease    GI/Hepatic Neg liver ROS, hiatal hernia,GERD  ,,  Endo/Other    Renal/GU Renal disease     Musculoskeletal  (+) Arthritis ,  Fibromyalgia -  Abdominal   Peds  Hematology   Anesthesia Other Findings   Reproductive/Obstetrics                             Anesthesia Physical Anesthesia Plan  ASA: 3  Anesthesia Plan: General   Post-op Pain Management:    Induction: Intravenous  PONV Risk Score and Plan: 4 or greater and Ondansetron, Dexamethasone, Midazolam and Scopolamine patch - Pre-op  Airway Management Planned: LMA  Additional Equipment: None  Intra-op Plan:   Post-operative Plan: Extubation in OR  Informed Consent: I have reviewed the patients History and Physical, chart, labs and discussed the procedure including the risks, benefits and alternatives for the proposed anesthesia with the patient or authorized representative who has indicated his/her understanding and acceptance.     Dental advisory given  Plan Discussed with: CRNA  Anesthesia Plan Comments:        Anesthesia Quick Evaluation

## 2022-09-03 NOTE — Anesthesia Postprocedure Evaluation (Signed)
Anesthesia Post Note  Patient: Kayla Mendoza  Procedure(s) Performed: CYSTOSCOPY BOTOX INJECTION (Bladder)     Patient location during evaluation: PACU Anesthesia Type: General Level of consciousness: awake and alert Pain management: pain level controlled Vital Signs Assessment: post-procedure vital signs reviewed and stable Respiratory status: spontaneous breathing, nonlabored ventilation, respiratory function stable and patient connected to nasal cannula oxygen Cardiovascular status: blood pressure returned to baseline and stable Postop Assessment: no apparent nausea or vomiting Anesthetic complications: no   No notable events documented.  Last Vitals:  Vitals:   09/03/22 1045 09/03/22 1057  BP:  (!) 146/87  Pulse:  (!) 48  Resp:  12  Temp: 36.6 C   SpO2:  99%    Last Pain:  Vitals:   09/03/22 1057  TempSrc:   PainSc: 0-No pain                 Effie Berkshire

## 2022-09-06 ENCOUNTER — Encounter (HOSPITAL_BASED_OUTPATIENT_CLINIC_OR_DEPARTMENT_OTHER): Payer: Self-pay | Admitting: Urology

## 2022-10-25 ENCOUNTER — Emergency Department (HOSPITAL_BASED_OUTPATIENT_CLINIC_OR_DEPARTMENT_OTHER)
Admission: EM | Admit: 2022-10-25 | Discharge: 2022-10-25 | Disposition: A | Discharge status: Home / Self Care | Payer: Medicaid Other | Attending: Emergency Medicine | Admitting: Emergency Medicine

## 2022-10-25 ENCOUNTER — Other Ambulatory Visit: Payer: Self-pay

## 2022-10-25 ENCOUNTER — Encounter (HOSPITAL_BASED_OUTPATIENT_CLINIC_OR_DEPARTMENT_OTHER): Payer: Self-pay | Admitting: *Deleted

## 2022-10-25 ENCOUNTER — Emergency Department (HOSPITAL_BASED_OUTPATIENT_CLINIC_OR_DEPARTMENT_OTHER): Payer: Medicaid Other | Admitting: Radiology

## 2022-10-25 DIAGNOSIS — S81812A Laceration without foreign body, left lower leg, initial encounter: Secondary | ICD-10-CM

## 2022-10-25 DIAGNOSIS — W01118A Fall on same level from slipping, tripping and stumbling with subsequent striking against other sharp object, initial encounter: Secondary | ICD-10-CM | POA: Diagnosis not present

## 2022-10-25 NOTE — ED Triage Notes (Signed)
Pt is here after tripping and falling this am.  She reports she cut her leg on a coffee cup which was on the ground.  Bleeding controlled, one abrasion appears superficial and the other deeper.. Last tetanus shot 2021.  Pt came back to triage ambulatory.  Reports left ankle soreness.

## 2022-10-25 NOTE — ED Provider Notes (Signed)
[facility] Provider Note   CSN: [id number] Arrival date & time: 10/25/22  9449     History  Chief Complaint  Patient presents with   Abrasion    Kayla Mendoza is a 47 y.o. female presenting with left leg wound s/p fall. Patient tripped and fell onto a coffee mug immediately prior to arrival. The mug shattered and cut her left lower leg. She has 2 linear abrasions on lateral shin/calf region. Also thinks she rolled her left ankle, has soreness at the back of her ankle.  Able to walk normally since the incident.  Did not hit head, lose consciousness, or sustain any other injuries in the fall.     Home Medications Prior to Admission medications   Medication Sig Start Date End Date Taking? Authorizing Provider  acetaminophen (TYLENOL) 325 MG tablet Take 650 mg by mouth as needed. Patient not taking: Reported on 02/15/2022 09/15/16   [provider]  ALPRAZolam Duanne Moron) 1 MG tablet Take 1 mg by mouth 3 (three) times daily as needed for anxiety.    [provider]  ARIPiprazole (ABILIFY MAINTENA IM) Inject 1 Dose into the muscle every 30 (thirty) days.    [provider]  buPROPion (WELLBUTRIN XL) 300 MG 24 hr tablet Take 300 mg by mouth daily.    [provider]  cephALEXin (KEFLEX) 500 MG capsule Take 1 capsule (500 mg total) by mouth at bedtime. 09/03/22   [name], MD  DULoxetine (CYMBALTA) 60 MG capsule Take 120 mg by mouth daily.    [provider]  lamoTRIgine (LAMICTAL) 150 MG tablet Take 150 mg by mouth 2 (two) times daily. 02/14/22   [provider]  meloxicam (MOBIC) 15 MG tablet Take 1 tablet (15 mg total) by mouth daily. 05/10/22   Hyatt, Max T, DPM  methylPREDNISolone (MEDROL DOSEPAK) 4 MG TBPK tablet 6 day dose pack - take as directed 05/10/22   Hyatt, Max T, DPM  metroNIDAZOLE (FLAGYL) 500 MG tablet Take 1 tablet (500 mg total) by mouth 2 (two) times daily. 04/13/22   Lamptey, Myrene Galas, MD   oxybutynin (DITROPAN-XL) 5 MG 24 hr tablet Take 5 mg by mouth daily. 04/13/22   [provider]  pantoprazole (PROTONIX) 40 MG tablet Take 40 mg by mouth at bedtime. 01/18/22   [provider]  phenazopyridine (PYRIDIUM) 200 MG tablet Take by mouth. 04/26/22   [provider]  propranolol (INDERAL) 60 MG tablet Take 60 mg by mouth daily.     [provider]  tizanidine (ZANAFLEX) 2 MG capsule Take 2 mg by mouth daily.     [provider]  triamcinolone cream (KENALOG) 0.1 % Apply 1 application. topically 2 (two) times daily. Apply thin layer twice daily to affected areas of eczema, do not use in any 1 area for greater than 2 weeks at a time without a break. 02/13/22   Prosperi, Christian H, PA-C  valACYclovir (VALTREX) 1000 MG tablet Take 1,000 mg by mouth daily as needed (cold sores).    [provider]      Allergies    Neurontin [gabapentin], Shellfish allergy, and Tramadol    Review of Systems   Review of Systems  Cardiovascular:  Negative for chest pain.  Musculoskeletal:  Negative for gait problem and joint swelling.       Left ankle pain  Skin:  Positive for wound.  Neurological:  Negative for syncope, weakness and light-headedness.    Physical Exam Updated Vital  Signs BP 123/74 (BP Location: Left Arm)   Pulse (!) 57   Temp 98.1 F (36.7 C) (Oral)   Resp 16   Wt 115.2 kg   LMP 03/28/2014   SpO2 97%   BMI 43.60 kg/m  Physical Exam Constitutional:      General: She is not in acute distress.    Appearance: Normal appearance.  HENT:     Head: Normocephalic and atraumatic.  Pulmonary:     Effort: Pulmonary effort is normal.  Musculoskeletal:        General: No swelling, tenderness or deformity. Normal range of motion.  Skin:    General: Skin is warm and dry.     Comments: 5cm nonbleeding superficial linear laceration over left anterior shin.  Additional 5cm superficial linear laceration over left lateral shin, with 3 cm  area that is slightly deeper.   Neurological:     General: No focal deficit present.     Mental Status: She is alert. Mental status is at baseline.     Motor: No weakness.     Gait: Gait normal.     ED Results / Procedures / Treatments   Labs (all labs ordered are listed, but only abnormal results are displayed) Labs Reviewed - No data to display  EKG None  Radiology DG Ankle Complete Left  Result Date: 10/25/2022 CLINICAL DATA:  Trauma, fall EXAM: LEFT ANKLE COMPLETE - 3+ VIEW COMPARISON:  10/25/2010 FINDINGS: No recent fracture or dislocation is seen. There is 3 mm smooth marginated calcification in the dorsal aspect of talonavicular joint, possibly old avulsion. Slightly smaller calcific density was seen in the same region in the previous study. There is possible tiny plantar spur in calcaneus. There is small linear calcification at the attachment of Achilles tendon to the calcaneus suggesting calcific tendinosis. IMPRESSION: No recent fracture or dislocation is seen in the left ankle. Electronically Signed   By: Elmer Picker M.D.   On: 10/25/2022 08:49    Procedures .Marland KitchenLaceration Repair  Date/Time: 10/25/2022 9:34 AM  Performed by: Alcus Dad, MD Authorized by: Lorelle Gibbs, DO   Consent:    Consent obtained:  Verbal   Consent given by:  Patient Laceration details:    Location:  Leg   Leg location:  L lower leg   Length (cm):  3 Treatment:    Area cleansed with:  Saline Skin repair:    Repair method:  Steri-Strips (dermabond)   Number of Steri-Strips:  3 Post-procedure details:    Dressing:  Antibiotic ointment and non-adherent dressing    Medications Ordered in ED Medications - No data to display  ED Course/ Medical Decision Making/ A&P                           Medical Decision Making Amount and/or Complexity of Data Reviewed Radiology: ordered.  This is a 47 year old female presenting with superficial laceration to left lower leg s/p trip and  fall onto a coffee mug.  Patient with 2 superficial lacerations on the left lateral shin, 1 of which has 3 cm area that is slightly deeper. Area was cleansed with saline, 3 Steri-Strips and Dermabond were applied followed by bacitracin and nonadherent dressing.  Tetanus up-to-date.  Patient also with left ankle pain but reassuringly normal exam and gait.  L ankle x-ray obtained and results personally reviewed-no abnormality noted.  Patient stable for discharge home.  Discussed proper wound care and return precautions reviewed.  Final  Clinical Impression(s) / ED Diagnoses Final diagnoses:  Laceration of left lower extremity, initial encounter    Rx / DC Orders ED Discharge Orders     None      Alcus Dad, MD PGY-3, [place]     Alcus Dad, MD 10/25/22 Hitchcock, [place], DO 10/26/22 [id number]

## 2023-02-02 ENCOUNTER — Encounter: Payer: Self-pay | Admitting: Emergency Medicine

## 2023-02-02 ENCOUNTER — Ambulatory Visit
Admission: EM | Admit: 2023-02-02 | Discharge: 2023-02-02 | Disposition: A | Discharge status: Home / Self Care | Payer: Medicaid Other | Attending: Family Medicine | Admitting: Family Medicine

## 2023-02-02 DIAGNOSIS — N898 Other specified noninflammatory disorders of vagina: Secondary | ICD-10-CM | POA: Diagnosis not present

## 2023-02-02 MED ORDER — FLUCONAZOLE 150 MG PO TABS: 150.0000 mg | ORAL_TABLET | Freq: Every day | ORAL | 0 refills | Status: DC
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
## 2023-02-02 NOTE — ED Triage Notes (Signed)
Pain at urethral site and white vaginal discharge x 3 days  Vaseline at site  Pt states area around urethra  is red and painful to touch  Denies dysuria

## 2023-02-02 NOTE — Discharge Instructions (Addendum)
Take the Diflucan today.  This is treatment for yeast infection.  Repeat in 1 week if you continue to have symptoms  A swab has been sent to the laboratory for analysis.  You will be called if any additional medical treatment is needed You can check your test results on MyChart  You are overdue for health prevention.  The last Pap smear I can find was 1999.  You also need colon cancer screening and mammograms.  Please see your PCP, family doctor or OB/GYN in follow-up

## 2023-02-03 LAB — CERVICOVAGINAL ANCILLARY ONLY
Bacterial Vaginitis (gardnerella): NEGATIVE
Candida Glabrata: NEGATIVE
Candida Vaginitis: NEGATIVE
Chlamydia: NEGATIVE
Comment: NEGATIVE
Comment: NEGATIVE
Comment: NEGATIVE
Comment: NEGATIVE
Comment: NEGATIVE
Comment: NORMAL
Neisseria Gonorrhea: NEGATIVE
Trichomonas: NEGATIVE

## 2023-02-14 ENCOUNTER — Other Ambulatory Visit: Payer: Self-pay | Admitting: Internal Medicine

## 2023-02-14 DIAGNOSIS — Z0000 Encounter for general adult medical examination without abnormal findings: Secondary | ICD-10-CM

## 2023-03-17 ENCOUNTER — Ambulatory Visit
Admission: RE | Admit: 2023-03-17 | Discharge: 2023-03-17 | Disposition: A | Discharge status: Home / Self Care | Payer: Medicaid Other | Source: Ambulatory Visit | Attending: Internal Medicine | Admitting: Internal Medicine

## 2023-03-17 DIAGNOSIS — Z0000 Encounter for general adult medical examination without abnormal findings: Secondary | ICD-10-CM

## 2023-03-18 ENCOUNTER — Other Ambulatory Visit: Payer: Self-pay | Admitting: Urology

## 2023-03-22 ENCOUNTER — Other Ambulatory Visit: Payer: Self-pay | Admitting: Internal Medicine

## 2023-03-22 DIAGNOSIS — R928 Other abnormal and inconclusive findings on diagnostic imaging of breast: Secondary | ICD-10-CM

## 2023-03-28 ENCOUNTER — Ambulatory Visit
Admission: RE | Admit: 2023-03-28 | Discharge: 2023-03-28 | Disposition: A | Discharge status: Home / Self Care | Payer: Medicaid Other | Source: Ambulatory Visit | Attending: Internal Medicine | Admitting: Internal Medicine

## 2023-03-28 DIAGNOSIS — R928 Other abnormal and inconclusive findings on diagnostic imaging of breast: Secondary | ICD-10-CM

## 2023-03-31 ENCOUNTER — Other Ambulatory Visit: Payer: Self-pay

## 2023-03-31 ENCOUNTER — Encounter (HOSPITAL_BASED_OUTPATIENT_CLINIC_OR_DEPARTMENT_OTHER): Payer: Self-pay | Admitting: Urology

## 2023-03-31 NOTE — Progress Notes (Signed)
Spoke w/ via phone for pre-op interview---pt Lab needs dos---- I stat              Lab results------current EKG 09-03-2022 in epic COVID test -----patient states asymptomatic no test needed Arrive at -------845 am 04-19-2023 NPO after MN NO Solid Food.  Clear liquids from MN until---745 am Med rec completed Medications to take morning of surgery -----alprazolam, bupropion, duloxetine, lamotrigene, propranolol, tizanidine Diabetic medication -----n/a Patient instructed no nail polish to be worn day of surgery Patient instructed to bring photo id and insurance card day of surgery Patient aware to have Driver (ride ) / caregiver  carolyn nicholson mother   for 24 hours after surgery  Patient Special Instructions -----none Pre-Op special Instructions -----none Patient verbalized understanding of instructions that were given at this phone interview. Patient denies shortness of breath, chest pain, fever, cough at this phone interview.

## 2023-04-18 NOTE — Anesthesia Preprocedure Evaluation (Signed)
Anesthesia Evaluation  Patient identified by MRN, date of birth, ID band Patient awake    Reviewed: Allergy & Precautions, NPO status , Patient's Chart, lab work & pertinent test results, reviewed documented beta blocker date and time   Airway Mallampati: II  TM Distance: >3 FB Neck ROM: Full    Dental  (+) Dental Advisory Given   Pulmonary sleep apnea    Pulmonary exam normal breath sounds clear to auscultation       Cardiovascular hypertension, Pt. on home beta blockers Normal cardiovascular exam+ Valvular Problems/Murmurs  Rhythm:Regular Rate:Normal     Neuro/Psych  Headaches PSYCHIATRIC DISORDERS Anxiety Depression     Neuromuscular disease    GI/Hepatic Neg liver ROS, hiatal hernia,GERD  Medicated and Controlled,,  Endo/Other    Morbid obesity  Renal/GU Renal disease     Musculoskeletal  (+) Arthritis ,  Fibromyalgia -  Abdominal  (+) + obese  Peds  Hematology   Anesthesia Other Findings   Reproductive/Obstetrics                              Anesthesia Physical Anesthesia Plan  ASA: 3  Anesthesia Plan: General   Post-op Pain Management: Tylenol PO (pre-op)*   Induction: Intravenous  PONV Risk Score and Plan: 4 or greater and Ondansetron, Dexamethasone, Midazolam and Scopolamine patch - Pre-op  Airway Management Planned: LMA  Additional Equipment: None  Intra-op Plan:   Post-operative Plan: Extubation in OR  Informed Consent: I have reviewed the patients History and Physical, chart, labs and discussed the procedure including the risks, benefits and alternatives for the proposed anesthesia with the patient or authorized representative who has indicated his/her understanding and acceptance.     Dental advisory given  Plan Discussed with: CRNA  Anesthesia Plan Comments:         Anesthesia Quick Evaluation

## 2023-04-19 ENCOUNTER — Encounter (HOSPITAL_BASED_OUTPATIENT_CLINIC_OR_DEPARTMENT_OTHER): Payer: Self-pay | Admitting: Urology

## 2023-04-19 ENCOUNTER — Ambulatory Visit (HOSPITAL_BASED_OUTPATIENT_CLINIC_OR_DEPARTMENT_OTHER): Payer: MEDICAID | Admitting: Anesthesiology

## 2023-04-19 ENCOUNTER — Encounter (HOSPITAL_BASED_OUTPATIENT_CLINIC_OR_DEPARTMENT_OTHER)
Admission: RE | Disposition: A | Discharge status: Home / Self Care | Payer: Self-pay | Source: Home / Self Care | Attending: Urology

## 2023-04-19 ENCOUNTER — Ambulatory Visit (HOSPITAL_BASED_OUTPATIENT_CLINIC_OR_DEPARTMENT_OTHER)
Admission: RE | Admit: 2023-04-19 | Discharge: 2023-04-19 | Disposition: A | Discharge status: Home / Self Care | Payer: MEDICAID | Attending: Urology | Admitting: Urology

## 2023-04-19 ENCOUNTER — Other Ambulatory Visit: Payer: Self-pay

## 2023-04-19 DIAGNOSIS — I10 Essential (primary) hypertension: Secondary | ICD-10-CM | POA: Insufficient documentation

## 2023-04-19 DIAGNOSIS — N3281 Overactive bladder: Secondary | ICD-10-CM | POA: Diagnosis not present

## 2023-04-19 DIAGNOSIS — F32A Depression, unspecified: Secondary | ICD-10-CM | POA: Insufficient documentation

## 2023-04-19 DIAGNOSIS — G4730 Sleep apnea, unspecified: Secondary | ICD-10-CM | POA: Diagnosis not present

## 2023-04-19 DIAGNOSIS — Z6841 Body Mass Index (BMI) 40.0 and over, adult: Secondary | ICD-10-CM | POA: Insufficient documentation

## 2023-04-19 DIAGNOSIS — M797 Fibromyalgia: Secondary | ICD-10-CM | POA: Insufficient documentation

## 2023-04-19 DIAGNOSIS — N3946 Mixed incontinence: Secondary | ICD-10-CM | POA: Insufficient documentation

## 2023-04-19 DIAGNOSIS — F419 Anxiety disorder, unspecified: Secondary | ICD-10-CM | POA: Insufficient documentation

## 2023-04-19 DIAGNOSIS — Z79899 Other long term (current) drug therapy: Secondary | ICD-10-CM | POA: Diagnosis not present

## 2023-04-19 DIAGNOSIS — K219 Gastro-esophageal reflux disease without esophagitis: Secondary | ICD-10-CM | POA: Insufficient documentation

## 2023-04-19 DIAGNOSIS — Z01818 Encounter for other preprocedural examination: Secondary | ICD-10-CM

## 2023-04-19 DIAGNOSIS — K449 Diaphragmatic hernia without obstruction or gangrene: Secondary | ICD-10-CM | POA: Diagnosis not present

## 2023-04-19 HISTORY — PX: BOTOX INJECTION: SHX5754

## 2023-04-19 HISTORY — DX: Palpitations: R00.2

## 2023-04-19 LAB — POCT I-STAT, CHEM 8
BUN: 4 mg/dL — ABNORMAL LOW (ref 6–20)
Calcium, Ion: 1.14 mmol/L — ABNORMAL LOW (ref 1.15–1.40)
Chloride: 102 mmol/L (ref 98–111)
Creatinine, Ser: 0.9 mg/dL (ref 0.44–1.00)
Glucose, Bld: 101 mg/dL — ABNORMAL HIGH (ref 70–99)
HCT: 34 % — ABNORMAL LOW (ref 36.0–46.0)
Hemoglobin: 11.6 g/dL — ABNORMAL LOW (ref 12.0–15.0)
Potassium: 3.8 mmol/L (ref 3.5–5.1)
Sodium: 143 mmol/L (ref 135–145)
TCO2: 31 mmol/L (ref 22–32)

## 2023-04-19 SURGERY — BOTOX INJECTION
Anesthesia: General | Site: Bladder

## 2023-04-19 MED ORDER — ONABOTULINUMTOXINA 100 UNITS IJ SOLR
INTRAMUSCULAR | Status: DC | PRN
  Administered 2023-04-19: 100 [IU] via INTRAMUSCULAR

## 2023-04-19 MED ORDER — MIDAZOLAM HCL 2 MG/2ML IJ SOLN
INTRAMUSCULAR | Status: AC
  Filled 2023-04-19: qty 2

## 2023-04-19 MED ORDER — PROPOFOL 10 MG/ML IV BOLUS
INTRAVENOUS | Status: DC | PRN
  Administered 2023-04-19: 200 mg via INTRAVENOUS

## 2023-04-19 MED ORDER — EPHEDRINE SULFATE (PRESSORS) 50 MG/ML IJ SOLN
INTRAMUSCULAR | Status: DC | PRN
  Administered 2023-04-19 (×2): 5 mg via INTRAVENOUS

## 2023-04-19 MED ORDER — LACTATED RINGERS IV SOLN: INTRAVENOUS | Status: DC

## 2023-04-19 MED ORDER — GLYCOPYRROLATE 0.2 MG/ML IJ SOLN
INTRAMUSCULAR | Status: DC | PRN
  Administered 2023-04-19 (×2): .1 mg via INTRAVENOUS

## 2023-04-19 MED ORDER — SODIUM CHLORIDE (PF) 0.9 % IJ SOLN
INTRAMUSCULAR | Status: DC | PRN
  Administered 2023-04-19: 10 mL

## 2023-04-19 MED ORDER — PROMETHAZINE HCL 25 MG/ML IJ SOLN: 6.2500 mg | INTRAMUSCULAR | Status: DC | PRN

## 2023-04-19 MED ORDER — CEFAZOLIN SODIUM-DEXTROSE 2-4 GM/100ML-% IV SOLN
2.0000 g | INTRAVENOUS | Status: AC
  Administered 2023-04-19: 2 g via INTRAVENOUS

## 2023-04-19 MED ORDER — LIDOCAINE HCL (CARDIAC) PF 100 MG/5ML IV SOSY
PREFILLED_SYRINGE | INTRAVENOUS | Status: DC | PRN
  Administered 2023-04-19: 50 mg via INTRAVENOUS

## 2023-04-19 MED ORDER — MEPERIDINE HCL 25 MG/ML IJ SOLN: 6.2500 mg | INTRAMUSCULAR | Status: DC | PRN

## 2023-04-19 MED ORDER — FENTANYL CITRATE (PF) 100 MCG/2ML IJ SOLN
INTRAMUSCULAR | Status: DC | PRN
  Administered 2023-04-19: 50 ug via INTRAVENOUS

## 2023-04-19 MED ORDER — MIDAZOLAM HCL 2 MG/2ML IJ SOLN
INTRAMUSCULAR | Status: DC | PRN
  Administered 2023-04-19: 2 mg via INTRAVENOUS

## 2023-04-19 MED ORDER — ONDANSETRON HCL 4 MG/2ML IJ SOLN
INTRAMUSCULAR | Status: DC | PRN
  Administered 2023-04-19: 4 mg via INTRAVENOUS

## 2023-04-19 MED ORDER — SODIUM CHLORIDE 0.9 % IR SOLN
Status: DC | PRN
  Administered 2023-04-19: 3000 mL

## 2023-04-19 MED ORDER — ACETAMINOPHEN 500 MG PO TABS
ORAL_TABLET | ORAL | Status: AC
  Filled 2023-04-19: qty 2

## 2023-04-19 MED ORDER — CEFAZOLIN SODIUM-DEXTROSE 2-4 GM/100ML-% IV SOLN
INTRAVENOUS | Status: AC
  Filled 2023-04-19: qty 100

## 2023-04-19 MED ORDER — FENTANYL CITRATE (PF) 100 MCG/2ML IJ SOLN
INTRAMUSCULAR | Status: AC
  Filled 2023-04-19: qty 2

## 2023-04-19 MED ORDER — ACETAMINOPHEN 500 MG PO TABS
1000.0000 mg | ORAL_TABLET | Freq: Once | ORAL | Status: AC
  Administered 2023-04-19: 1000 mg via ORAL

## 2023-04-19 MED ORDER — SCOPOLAMINE 1 MG/3DAYS TD PT72
1.0000 | MEDICATED_PATCH | TRANSDERMAL | Status: DC
  Administered 2023-04-19: 1.5 mg via TRANSDERMAL

## 2023-04-19 MED ORDER — SCOPOLAMINE 1 MG/3DAYS TD PT72
MEDICATED_PATCH | TRANSDERMAL | Status: AC
  Filled 2023-04-19: qty 1

## 2023-04-19 MED ORDER — FENTANYL CITRATE (PF) 100 MCG/2ML IJ SOLN: 25.0000 ug | INTRAMUSCULAR | Status: DC | PRN

## 2023-04-19 MED ORDER — AMISULPRIDE (ANTIEMETIC) 5 MG/2ML IV SOLN: 10.0000 mg | Freq: Once | INTRAVENOUS | Status: DC | PRN

## 2023-04-19 MED ORDER — DEXAMETHASONE SODIUM PHOSPHATE 4 MG/ML IJ SOLN
INTRAMUSCULAR | Status: DC | PRN
  Administered 2023-04-19: 8 mg via INTRAVENOUS

## 2023-04-19 SURGICAL SUPPLY — 20 items
BAG DRAIN URO-CYSTO SKYTR STRL (DRAIN) ×1 IMPLANT
BAG DRN UROCATH (DRAIN) ×1
CLOTH BEACON ORANGE TIMEOUT ST (SAFETY) ×1 IMPLANT
ELECT REM PT RETURN 9FT ADLT (ELECTROSURGICAL)
ELECTRODE REM PT RTRN 9FT ADLT (ELECTROSURGICAL) ×1 IMPLANT
GLOVE BIO SURGEON STRL SZ7.5 (GLOVE) ×1 IMPLANT
GLOVE BIO SURGEON STRL SZ8 (GLOVE) IMPLANT
GOWN STRL REUS W/TWL LRG LVL3 (GOWN DISPOSABLE) ×1 IMPLANT
IV NS IRRIG 3000ML ARTHROMATIC (IV SOLUTION) IMPLANT
KIT TURNOVER CYSTO (KITS) ×1 IMPLANT
MANIFOLD NEPTUNE II (INSTRUMENTS) IMPLANT
NDL ASPIRATION 22 (NEEDLE) IMPLANT
NDL HYPO 22X1.5 SAFETY MO (MISCELLANEOUS) IMPLANT
NEEDLE ASPIRATION 22 (NEEDLE) ×1 IMPLANT
NEEDLE HYPO 22X1.5 SAFETY MO (MISCELLANEOUS) IMPLANT
NS IRRIG 500ML POUR BTL (IV SOLUTION) IMPLANT
PACK CYSTO (CUSTOM PROCEDURE TRAY) ×1 IMPLANT
SLEEVE SCD COMPRESS KNEE MED (STOCKING) ×1 IMPLANT
TUBE CONNECTING 12X1/4 (SUCTIONS) IMPLANT
WATER STERILE IRR 3000ML UROMA (IV SOLUTION) ×1 IMPLANT

## 2023-04-19 NOTE — H&P (Signed)
H&P  Chief Complaint: Mixed incontinence, overactive bladder  History of Present Illness: Kayla Mendoza is a 47 year old female with a history of mixed incontinence and overactive bladder with typical frequency and urgency urinary symptoms.  She underwent Botox 100 units in November 2023 and did very well.  She had much less leakage, less urgency and frequency.  She was happy.  She can feel the symptoms returning and presents for another round of Botox.  She has had no dysuria or gross hematuria.  No fever, cough cold or congestion.  Past Medical History:  Diagnosis Date   Anxiety    Arthritis of back    Bladder neoplasm    DDD (degenerative disc disease), lumbar    Depression    Fibromyalgia    GERD (gastroesophageal reflux disease)    H/O hiatal hernia    Hypertension    Migraines    Nocturia    Palpitations    Pelvic pain in female    Pinched vertebral nerve    RLS (restless legs syndrome)    Scoliosis    Sleep apnea    uses cpap set on 15   Urgency of urination    Past Surgical History:  Procedure Laterality Date   ABDOMINAL HYSTERECTOMY  2015   BOTOX INJECTION N/A 09/03/2022   Procedure: CYSTOSCOPY BOTOX INJECTION;  Surgeon: Jerilee Field, MD;  Location: [facility];  Service: Urology;  Laterality: N/A;  30 MINS FOR CASE   BREAST BIOPSY Right 09/04/2021   CYSTOSCOPY N/A 04/17/2014   Procedure: CYSTOSCOPY;  Surgeon: Lavina Hamman, MD;  Location: [facility];  Service: Gynecology;  Laterality: N/A;  clean/contaminated   CYSTOSCOPY WITH BIOPSY N/A 03/08/2014   Procedure: CYSTOSCOPY WITH  TURBT SMALL HYDROTENSION OF BLADDER INSTALLATION OF MARCAINE AND  PYRIDIUM;  Surgeon: Jerilee Field, MD;  Location: [facility];  Service: Urology;  Laterality: N/A;   ESSURE TUBAL LIGATION  2010   EXCISION OF BACK LESION Right 02/22/2017   Procedure: EXCISION OF MASS RIGHT BACK;  Surgeon: Claud Kelp, MD;  Location: [facility];  Service: General;   Laterality: Right;   LAPAROSCOPIC ASSISTED VAGINAL HYSTERECTOMY Bilateral 04/17/2014   Procedure: LAPAROSCOPIC ASSISTED VAGINAL HYSTERECTOMY, Bilateral Salpingectomy;  Surgeon: Lavina Hamman, MD;  Location: [facility];  Service: Gynecology;  Laterality: Bilateral;  clean/contaminated   LIPOMA EXCISION     NEGATIVE SLEEP STUDY  2013    Home Medications:  Medications Prior to Admission  Medication Sig Dispense Refill Last Dose   buPROPion (WELLBUTRIN XL) 150 MG 24 hr tablet Take 150 mg by mouth daily.      propranolol ER (INDERAL LA) 120 MG 24 hr capsule Take 120 mg by mouth daily.      ALPRAZolam (XANAX) 1 MG tablet Take 1 mg by mouth 3 (three) times daily as needed for anxiety.      ARIPiprazole (ABILIFY MAINTENA IM) Inject 1 Dose into the muscle every 30 (thirty) days.      DULoxetine (CYMBALTA) 60 MG capsule Take 120 mg by mouth daily.      lamoTRIgine (LAMICTAL) 150 MG tablet Take 150 mg by mouth 2 (two) times daily.      pantoprazole (PROTONIX) 40 MG tablet Take 40 mg by mouth at bedtime.      tizanidine (ZANAFLEX) 2 MG capsule Take 2 mg by mouth 2 (two) times daily.      valACYclovir (VALTREX) 1000 MG tablet Take 1,000 mg by mouth daily as needed (cold sores).  Allergies:  Allergies  Allergen Reactions   Neurontin [Gabapentin] Other (See Comments)    Caused chest pain   Shellfish Allergy Hives   Tramadol Itching and Rash    Family History  Problem Relation Age of Onset   Hypertension Maternal Grandfather    Diabetes Paternal Grandmother    Diabetes Paternal Grandfather    Breast cancer Paternal Aunt    Social History:  reports that she has never smoked. She has never used smokeless tobacco. She reports current alcohol use. She reports that she does not use drugs.  ROS: A complete review of systems was performed.  All systems are negative except for pertinent findings as noted. Review of Systems  All other systems reviewed and are negative.    Physical Exam:  Vital  signs in last 24 hours:   General:  Alert and oriented, No acute distress HEENT: Normocephalic, atraumatic Cardiovascular: Regular rate and rhythm Lungs: Regular rate and effort Abdomen: Soft, nontender, nondistended, no abdominal masses Back: No CVA tenderness Extremities: No edema Neurologic: Grossly intact  Laboratory Data:  No results found for this or any previous visit (from the past 24 hour(s)). No results found for this or any previous visit (from the past 240 hour(s)). Creatinine: No results for input(s): "CREATININE" in the last 168 hours.  Impression/Assessment:  Mixed incontinence, overactive bladder-  Plan:  I discussed with the patient the nature, potential benefits, risks and alternatives to cystoscopy with Botox injection 100 units, including side effects of the proposed treatment, the likelihood of the patient achieving the goals of the procedure, and any potential problems that might occur during the procedure or recuperation.  We discussed risk of urinary retention and how to deal with the painful inability to void should it develop among other risk.  All questions answered. Patient elects to proceed.   Jerilee Field 04/19/2023, 9:11 AM

## 2023-04-19 NOTE — Transfer of Care (Signed)
Immediate Anesthesia Transfer of Care Note  Patient: Kayla Mendoza  Procedure(s) Performed: CYSTOSCOPY BOTOX INJECTION 100 UNITS (Bladder)  Patient Location: PACU  Anesthesia Type:General  Level of Consciousness: awake and patient cooperative  Airway & Oxygen Therapy: Patient Spontanous Breathing and Patient connected to nasal cannula oxygen  Post-op Assessment: Report given to RN and Post -op Vital signs reviewed and stable  Post vital signs: Reviewed and stable  Last Vitals:  Vitals Value Taken Time  BP    Temp 36.5 C 04/19/23 1134  Pulse 57 04/19/23 1134  Resp 20 04/19/23 1134  SpO2 100 % 04/19/23 1134  Vitals shown include unvalidated device data.  Last Pain:  Vitals:   04/19/23 0915  TempSrc: Oral  PainSc: 0-No pain      Patients Stated Pain Goal: 4 (04/19/23 0915)  Complications: No notable events documented.

## 2023-04-19 NOTE — Discharge Instructions (Addendum)
Botulinum Toxin Bladder Injection  A botulinum toxin bladder injection is a procedure to treat an overactive bladder. During the procedure, a drug called botulinum toxin is injected into the bladder through a long, thin needle. This drug relaxes the bladder muscles and reduces overactivity. You may need this procedure if your medicines are not working or you cannot take them. The procedure may be repeated as needed. The treatment is done once and it usually lasts for 6 months. Your health care provider will monitor you to see how well you respond. What happens during the procedure?  A long, thin scope called a cystoscope will be passed into your bladder through the part of the body that carries urine from your bladder (urethra). The cystoscope will be used to fill your bladder with water. A long needle will be passed through the cystoscope and into the bladder. The botulinum toxin will be injected into your bladder. It may be injected into multiple areas of your bladder. The cystoscope will be removed and your bladder will be emptied with a urinary catheter. The procedure may vary among health care providers and hospitals. What can I expect after the procedure? After your procedure, it is common to have: Blood-tinged urine. Burning or soreness when you pass urine. Follow these instructions at home: Medicines Take over-the-counter and prescription medicines only as told by your health care provider. If you were prescribed an antibiotic medicine, take it as told by your health care provider. Do not stop using the antibiotic even if you start to feel better. General instructions  If you were given a sedative during the procedure, it can affect you for several hours. Do not drive or operate machinery until your health care provider says that it is safe. Drink enough fluid to keep your urine pale yellow. Return to your normal activities as told by your health care provider. Ask your health care  provider what activities are safe for you. Keep all follow-up visits. Contact a health care provider if you have: A fever or chills. Blood-tinged urine for more than one day after your procedure. Worsening pain or burning when you pass urine. Pain or burning when passing urine for more than two days after your procedure. Trouble emptying your bladder. Get help right away if you: Have bright red blood in your urine. Are unable to pass urine. Summary A botulinum toxin bladder injection is a procedure to treat an overactive bladder. This is generally a safe procedure. However, problems may occur, including not being able to pass urine, bleeding, infection, pain, and an allergic reaction to the botulinum toxin. You will be told when to stop eating and drinking, and what medicines to change or stop. Follow instructions carefully. After the procedure, it is common to have blood in your urine and to have soreness or burning when passing urine. Contact a health care provider if you have a fever, blood in your urine for more than a few days, or trouble passing urine. Get help right away if you have bright red blood in your urine, or if you are unable to pass urine. This information is not intended to replace advice given to you by your health care provider. Make sure you discuss any questions you have with your health care provider. Document Revised: 04/10/2021 Document Reviewed: 04/10/2021 Elsevier Patient Education  2024 [employer]   Post Anesthesia Home Care Instructions  Activity: Get plenty of rest for the remainder of the day. A responsible individual must stay with you for  24 hours following the procedure.  For the next 24 hours, DO NOT: -Drive a car -Advertising copywriter -Drink alcoholic beverages -Take any medication unless instructed by your physician -Make any legal decisions or sign important papers.  Meals: Start with liquid foods such as gelatin or soup. Progress to regular  foods as tolerated. Avoid greasy, spicy, heavy foods. If nausea and/or vomiting occur, drink only clear liquids until the nausea and/or vomiting subsides. Call your physician if vomiting continues.  Special Instructions/Symptoms: Your throat may feel dry or sore from the anesthesia or the breathing tube placed in your throat during surgery. If this causes discomfort, gargle with warm salt water. The discomfort should disappear within 24 hours.  If you had a scopolamine patch placed behind your ear for the management of post- operative nausea and/or vomiting:  1. The medication in the patch is effective for 72 hours, after which it should be removed.  Wrap patch in a tissue and discard in the trash. Wash hands thoroughly with soap and water. 2. You may remove the patch earlier than 72 hours if you experience unpleasant side effects which may include dry mouth, dizziness or visual disturbances. 3. Avoid touching the patch. Wash your hands with soap and water after contact with the patch. Remove patch by Friday 04/22/23    No acetaminophen/Tylenol until after 3 pm today if needed.

## 2023-04-19 NOTE — Anesthesia Procedure Notes (Signed)
Procedure Name: LMA Insertion Date/Time: 04/19/2023 10:57 AM  Performed by: Earmon Phoenix, CRNAPre-anesthesia Checklist: Patient identified, Emergency Drugs available, Suction available, Patient being monitored and Timeout performed Patient Re-evaluated:Patient Re-evaluated prior to induction Oxygen Delivery Method: Circle system utilized Preoxygenation: Pre-oxygenation with 100% oxygen Induction Type: IV induction Ventilation: Mask ventilation without difficulty LMA: LMA inserted LMA Size: 4.0 Placement Confirmation: positive ETCO2 and breath sounds checked- equal and bilateral Tube secured with: Tape Dental Injury: Teeth and Oropharynx as per pre-operative assessment

## 2023-04-19 NOTE — Op Note (Signed)
Preoperative diagnosis: Overactive bladder, mixed incontinence Postoperative diagnosis: Same  Procedure: Cystoscopy with Botox injection 100 units  Surgeon: Mena Goes  Anesthesia: General  Indication for procedure: Kayla Mendoza is a 47 year old female with mixed incontinence and frequency and urgency consistent with overactive bladder.  She did well with Botox and presents for repeat injection.  Findings: The urethra and the bladder are unremarkable.  No stone or foreign body in the bladder.  No mucosal lesions.  The bladder and the urethra were palpably normal on exam.  Description of procedure: After consent was obtained patient brought to the operating room.  After adequate anesthesia she is placed in lithotomy position and prepped and draped in the usual sterile fashion.  Timeout was performed to confirm the patient and procedure.  Botox 100 units was diluted with 10 mL of water for a 10 unit/mL dilution.  The Botox scope was passed and then in the usual Graves' state fashion 0.5 mL was injected x 20 or 5 units at each site.  The bladder was inspected and hemostasis was good at low pressure.  The bladder was filled with some irrigation and the scope removed.  Exam of the bladder and urethra was performed.  She was awakened taken the cover room in stable condition.  Complications: None  Blood loss: Minimal  Specimens: None  Drains: None  Disposition: Patient stable to PACU

## 2023-04-20 ENCOUNTER — Encounter (HOSPITAL_BASED_OUTPATIENT_CLINIC_OR_DEPARTMENT_OTHER): Payer: Self-pay | Admitting: Urology

## 2023-04-22 NOTE — Anesthesia Postprocedure Evaluation (Signed)
Anesthesia Post Note  Patient: Kayla Mendoza  Procedure(s) Performed: CYSTOSCOPY BOTOX INJECTION 100 UNITS (Bladder)     Patient location during evaluation: PACU Anesthesia Type: General Level of consciousness: sedated and patient cooperative Pain management: pain level controlled Vital Signs Assessment: post-procedure vital signs reviewed and stable Respiratory status: spontaneous breathing Cardiovascular status: stable Anesthetic complications: no   No notable events documented.  Last Vitals:  Vitals:   04/19/23 1200 04/19/23 1229  BP: (!) 149/87 (!) 159/95  Pulse: (!) 59 60  Resp: 15 16  Temp:  (!) 36.3 C  SpO2: 97% 100%    Last Pain:  Vitals:   04/20/23 1300  TempSrc:   PainSc: 0-No pain                 Lewie Loron

## 2023-04-29 IMAGING — MG MM DIGITAL SCREENING BILAT W/ TOMO AND CAD
8 series · 8 of 24 positions shown · non-contrast
Comparison: Previous exam(s).

CLINICAL DATA: Screening.

EXAM:
DIGITAL SCREENING BILATERAL MAMMOGRAM WITH TOMOSYNTHESIS AND CAD
TECHNIQUE: Bilateral screening digital craniocaudal and mediolateral oblique
mammograms were obtained. Bilateral screening digital breast
tomosynthesis was performed. The images were evaluated with
computer-aided detection.

[L MLO synth-2D]
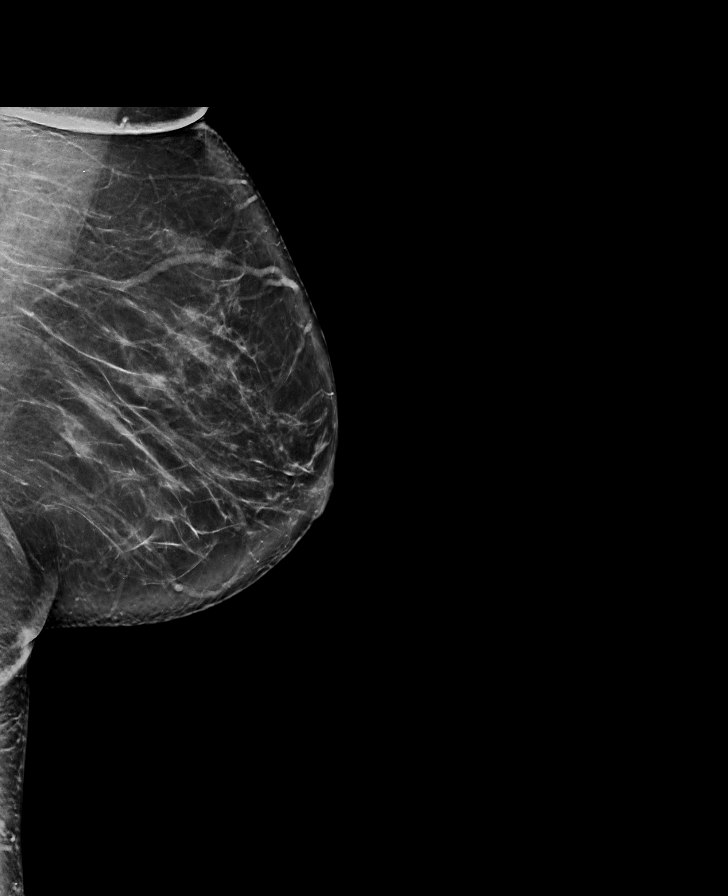

[L CC synth-2D]
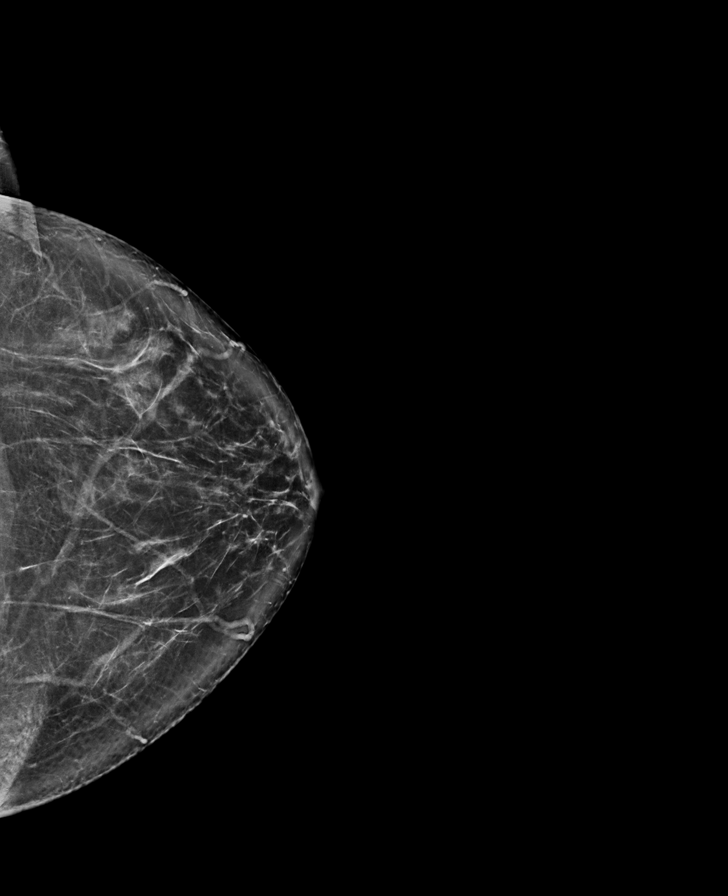

[R CC synth-2D]
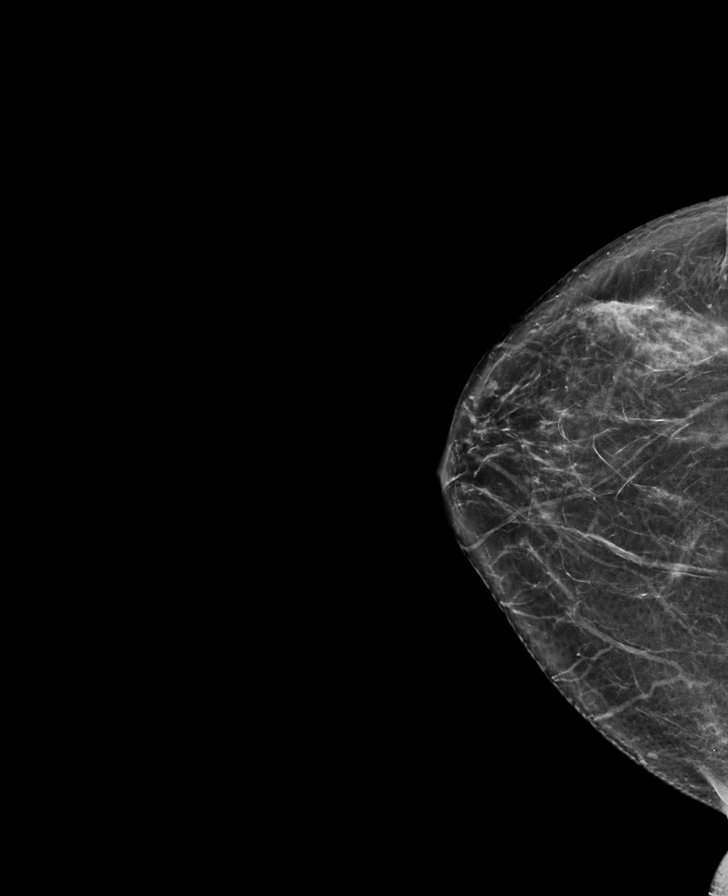

[R MLO synth-2D]
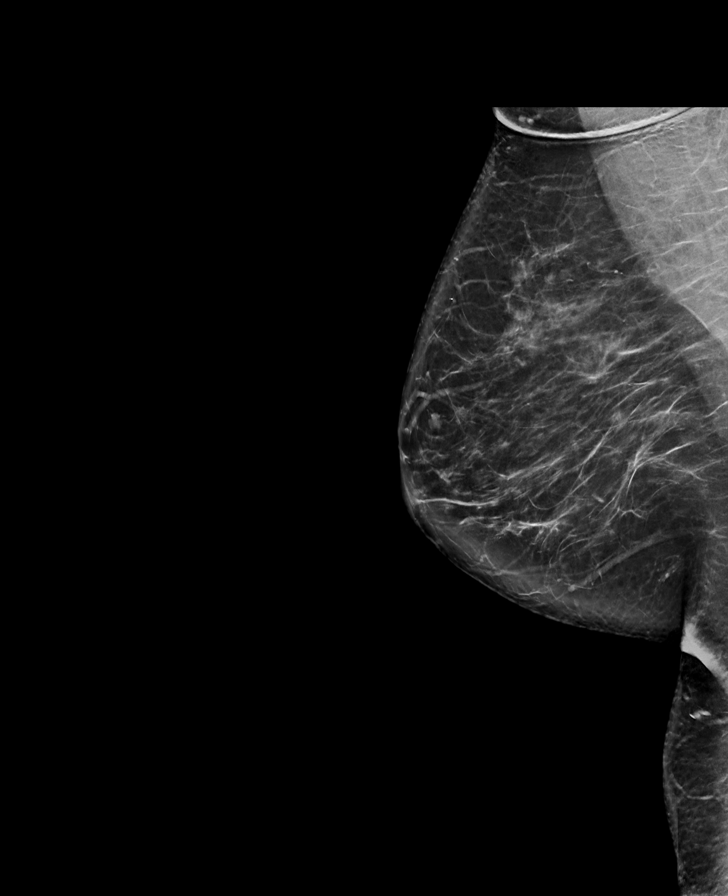

[L CC tomo · tomo slice 43/86.0]
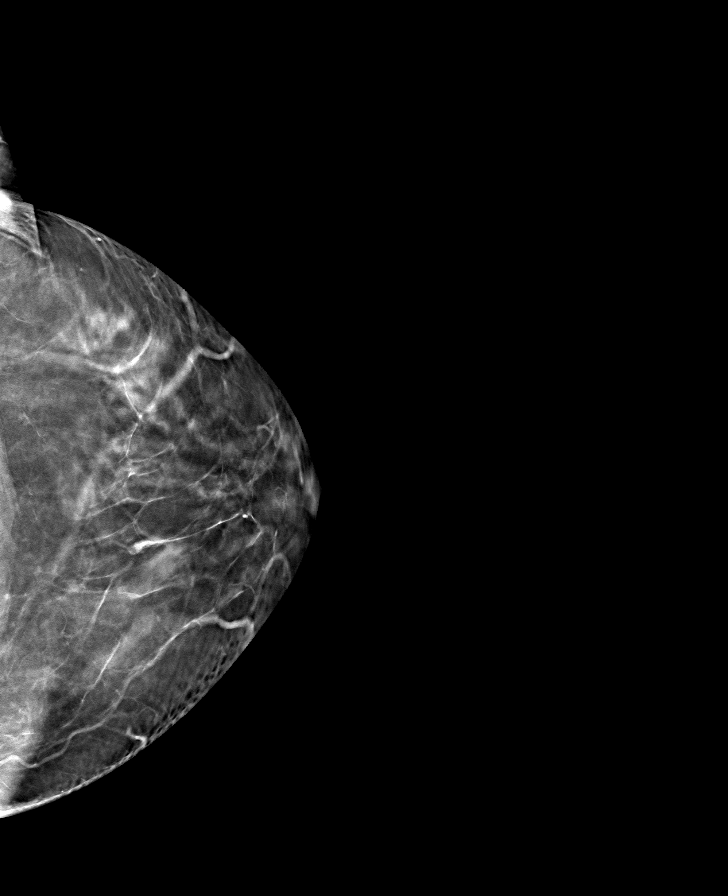

[R MLO tomo · tomo slice 46/91.0]
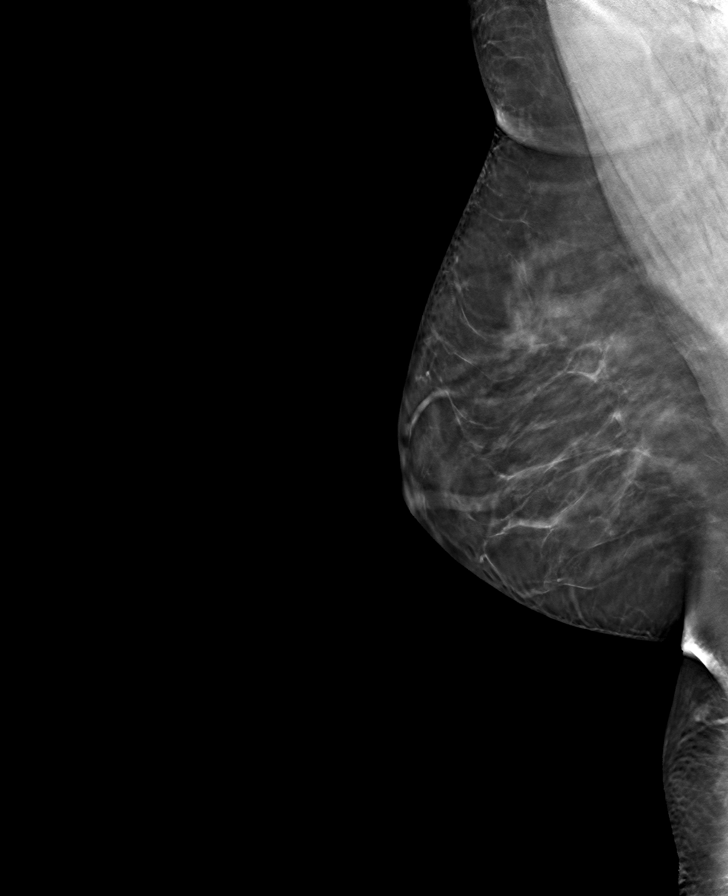

[L MLO tomo · tomo slice 45/88.0]
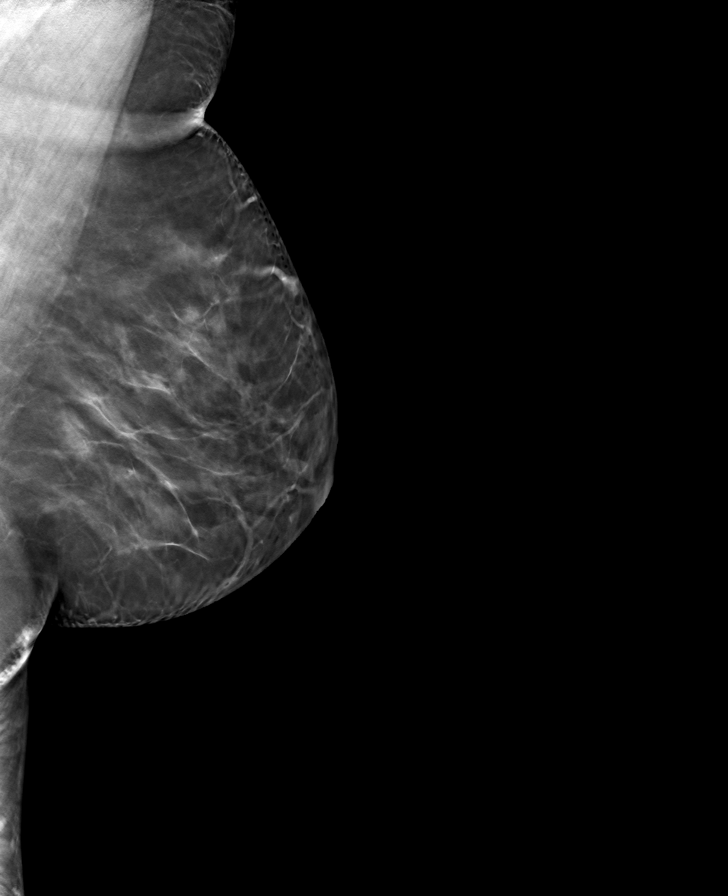

[R CC tomo · tomo slice 38/75.0]
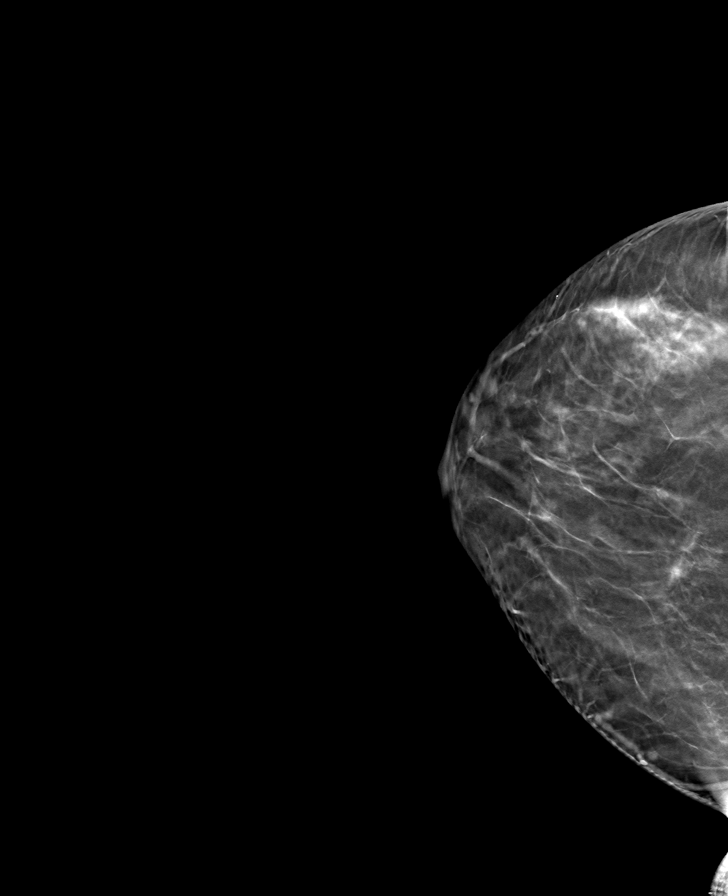

[8 of 24 positions shown; findings below may reference images not displayed]

ACR Breast Density Category b: There are scattered areas of
fibroglandular density.
FINDINGS: In the right breast, a possible mass warrants further evaluation. In
the left breast, no findings suspicious for malignancy.
IMPRESSION: Further evaluation is suggested for a possible mass in the right
breast.

RECOMMENDATION:
Diagnostic mammogram and possibly ultrasound of the right breast.
(Code:[id number])

The patient will be contacted regarding the findings, and additional
imaging will be scheduled.

BI-RADS CATEGORY  0: Incomplete. Need additional imaging evaluation
and/or prior mammograms for comparison.

## 2023-05-25 ENCOUNTER — Emergency Department (HOSPITAL_BASED_OUTPATIENT_CLINIC_OR_DEPARTMENT_OTHER): Payer: MEDICAID | Admitting: Radiology

## 2023-05-25 ENCOUNTER — Emergency Department (HOSPITAL_BASED_OUTPATIENT_CLINIC_OR_DEPARTMENT_OTHER)
Admission: EM | Admit: 2023-05-25 | Discharge: 2023-05-25 | Disposition: A | Discharge status: Home / Self Care | Payer: MEDICAID | Attending: Emergency Medicine | Admitting: Emergency Medicine

## 2023-05-25 ENCOUNTER — Other Ambulatory Visit: Payer: Self-pay

## 2023-05-25 DIAGNOSIS — R42 Dizziness and giddiness: Secondary | ICD-10-CM | POA: Insufficient documentation

## 2023-05-25 DIAGNOSIS — R55 Syncope and collapse: Secondary | ICD-10-CM | POA: Insufficient documentation

## 2023-05-25 DIAGNOSIS — Z79899 Other long term (current) drug therapy: Secondary | ICD-10-CM | POA: Insufficient documentation

## 2023-05-25 DIAGNOSIS — R202 Paresthesia of skin: Secondary | ICD-10-CM | POA: Insufficient documentation

## 2023-05-25 DIAGNOSIS — R0789 Other chest pain: Secondary | ICD-10-CM | POA: Insufficient documentation

## 2023-05-25 DIAGNOSIS — I10 Essential (primary) hypertension: Secondary | ICD-10-CM | POA: Diagnosis not present

## 2023-05-25 LAB — CBC
HCT: 35.3 % — ABNORMAL LOW (ref 36.0–46.0)
Hemoglobin: 11.7 g/dL — ABNORMAL LOW (ref 12.0–15.0)
MCH: 30 pg (ref 26.0–34.0)
MCHC: 33.1 g/dL (ref 30.0–36.0)
MCV: 90.5 fL (ref 80.0–100.0)
Platelets: 300 10*3/uL (ref 150–400)
RBC: 3.9 MIL/uL (ref 3.87–5.11)
RDW: 14 % (ref 11.5–15.5)
WBC: 6.5 10*3/uL (ref 4.0–10.5)
nRBC: 0 % (ref 0.0–0.2)

## 2023-05-25 LAB — BASIC METABOLIC PANEL
Anion gap: 7 (ref 5–15)
BUN: 7 mg/dL (ref 6–20)
CO2: 31 mmol/L (ref 22–32)
Calcium: 8.8 mg/dL — ABNORMAL LOW (ref 8.9–10.3)
Chloride: 103 mmol/L (ref 98–111)
Creatinine, Ser: 0.86 mg/dL (ref 0.44–1.00)
GFR, Estimated: >60 mL/min (ref >60)
Glucose, Bld: 86 mg/dL (ref 70–99)
Potassium: 3.8 mmol/L (ref 3.5–5.1)
Sodium: 141 mmol/L (ref 135–145)

## 2023-05-25 LAB — TROPONIN I (HIGH SENSITIVITY): Troponin I (High Sensitivity): 2 ng/L (ref <18)

## 2023-05-25 NOTE — Discharge Instructions (Signed)
Follow-up with primary doctor in the next 1 to 2 weeks, and return to the ER if symptoms significantly worsen or change.

## 2023-05-25 NOTE — ED Triage Notes (Addendum)
Presents from home for dizziness and ear ringing that woke her from sleep.  Endorses L lower rib pain and heaviness, L leg "tingling" that started 0105 .  Denies fever, N/V, cough, weakness.   Father died age 47 from MI.   H/o HTN Takes propanolol, cymbalta, lamictal, tizanidine

## 2023-05-25 NOTE — ED Provider Notes (Signed)
[place] EMERGENCY DEPARTMENT AT [facility] Provider Note   CSN: [id number] Arrival date & time: 05/25/23  0157     History  Chief Complaint  Patient presents with   Dizziness    Kayla Mendoza is a 47 y.o. female.  Patient is a 47 year old female with past medical history of hypertension, anxiety, GERD, migraines, fibromyalgia.  Patient presenting with complaints of dizziness.  She tells me she woke from sleep just prior to arrival feeling as if she was going to pass out.  She describes tightness in her chest behind her left breast and a tingling sensation to her left leg.  She denies shortness of breath, nausea, diaphoresis, or radiation to the arm or jaw.  No aggravating or alleviating factors.  Patient felt well when she went to bed last night.  The history is provided by the patient.       Home Medications Prior to Admission medications   Medication Sig Start Date End Date Taking? Authorizing Provider  ALPRAZolam Prudy Feeler) 1 MG tablet Take 1 mg by mouth 3 (three) times daily as needed for anxiety.    [provider]  ARIPiprazole (ABILIFY MAINTENA IM) Inject 1 Dose into the muscle every 30 (thirty) days.    [provider]  buPROPion (WELLBUTRIN XL) 150 MG 24 hr tablet Take 150 mg by mouth daily.    [provider]  DULoxetine (CYMBALTA) 60 MG capsule Take 120 mg by mouth daily.    [provider]  lamoTRIgine (LAMICTAL) 150 MG tablet Take 150 mg by mouth 2 (two) times daily. 02/14/22   [provider]  pantoprazole (PROTONIX) 40 MG tablet Take 40 mg by mouth at bedtime. 01/18/22   [provider]  propranolol ER (INDERAL LA) 120 MG 24 hr capsule Take 120 mg by mouth daily.    [provider]  tizanidine (ZANAFLEX) 2 MG capsule Take 2 mg by mouth 2 (two) times daily.    [provider]  valACYclovir (VALTREX) 1000 MG tablet Take 1,000 mg by mouth daily as needed (cold sores).    [provider]      Allergies    Neurontin [gabapentin], Shellfish allergy, and Tramadol    Review of Systems   Review of Systems  All other systems reviewed and are negative.   Physical Exam Updated Vital Signs BP (!) 153/91 (BP Location: Right Arm)   Pulse (!) 59   Temp 98.5 F (36.9 C)   Resp 18   Wt 111 kg   LMP 03/28/2014   SpO2 100%   BMI 42.00 kg/m  Physical Exam Vitals and nursing note reviewed.  Constitutional:      General: She is not in acute distress.    Appearance: She is well-developed. She is not diaphoretic.  HENT:     Head: Normocephalic and atraumatic.  Eyes:     Extraocular Movements: Extraocular movements intact.     Pupils: Pupils are equal, round, and reactive to light.  Cardiovascular:     Rate and Rhythm: Normal rate and regular rhythm.     Heart sounds: No murmur heard.    No friction rub. No gallop.  Pulmonary:     Effort: Pulmonary effort is normal. No respiratory distress.     Breath sounds: Normal breath sounds. No wheezing.  Abdominal:     General: Bowel sounds are normal. There is no distension.     Palpations: Abdomen is soft.     Tenderness: There is no abdominal  tenderness.  Musculoskeletal:        General: Normal range of motion.     Cervical back: Normal range of motion and neck supple.  Skin:    General: Skin is warm and dry.  Neurological:     General: No focal deficit present.     Mental Status: She is alert and oriented to person, place, and time.     Cranial Nerves: No cranial nerve deficit.     ED Results / Procedures / Treatments   Labs (all labs ordered are listed, but only abnormal results are displayed) Labs Reviewed  CBC - Abnormal; Notable for the following components:      Result Value   Hemoglobin 11.7 (*)    HCT 35.3 (*)    All other components within normal limits  BASIC METABOLIC PANEL  TROPONIN I (HIGH SENSITIVITY)    EKG EKG Interpretation Date/Time:  Wednesday May 25 2023 02:10:55 EDT Ventricular  Rate:  56 PR Interval:  142 QRS Duration:  99 QT Interval:  428 QTC Calculation: 413 R Axis:   62  Text Interpretation: Sinus rhythm ST elev, probable normal early repol pattern Unchanged from 09/03/2022 Confirmed by Geoffery Lyons ([id number]) on 05/25/2023 2:13:50 AM  Radiology DG Chest Port 1 View  Result Date: 05/25/2023 CLINICAL DATA:  Chest pain. EXAM: PORTABLE CHEST 1 VIEW COMPARISON:  August 08, 2019 FINDINGS: The heart size and mediastinal contours are within normal limits. There is no evidence of an acute infiltrate, pleural effusion or pneumothorax. The visualized skeletal structures are unremarkable. IMPRESSION: No active disease. Electronically Signed   By: Aram Candela M.D.   On: 05/25/2023 02:35    Procedures Procedures    Medications Ordered in ED Medications - No data to display  ED Course/ Medical Decision Making/ A&P  Patient is a 47 year old female presenting with complaints of dizziness and near syncope.  This sensation woke her from sleep.  Patient arrives here with stable vital signs and is afebrile.  There is no hypoxia.  Physical examination basically unremarkable.  Laboratory studies obtained including CBC and basic metabolic panel.  She is mildly anemic with hemoglobin of 11.7, but studies otherwise unremarkable.  Troponin is negative.  Chest x-ray shows no acute process.  Patient has been observed here for 2 hours and symptoms have since resolved.  I am uncertain as to the exact etiology of the symptoms, but nothing appears emergent.  She has had no ectopy on her rhythm strip and vital signs are unremarkable.  At this point, patient to be discharged with as needed return.  Final Clinical Impression(s) / ED Diagnoses Final diagnoses:  None    Rx / DC Orders ED Discharge Orders     None         Geoffery Lyons, MD 05/25/23 [phone]

## 2024-07-25 ENCOUNTER — Other Ambulatory Visit: Payer: Self-pay | Admitting: Nurse Practitioner

## 2024-07-25 DIAGNOSIS — Z1231 Encounter for screening mammogram for malignant neoplasm of breast: Secondary | ICD-10-CM

## 2024-07-27 ENCOUNTER — Ambulatory Visit
Admission: RE | Admit: 2024-07-27 | Discharge: 2024-07-27 | Disposition: A | Discharge status: Home / Self Care | Payer: MEDICAID | Source: Ambulatory Visit | Attending: Nurse Practitioner | Admitting: Nurse Practitioner

## 2024-07-27 DIAGNOSIS — Z1231 Encounter for screening mammogram for malignant neoplasm of breast: Secondary | ICD-10-CM
